# Patient Record
Sex: Male | Born: 1985 | Hispanic: No | Marital: Single | Smoking: Current every day smoker
Health system: Southern US, Community
[De-identification: ages and names within clinical notes are randomized; demographics above are authoritative.]

## PROBLEM LIST (undated history)

## (undated) DIAGNOSIS — I499 Cardiac arrhythmia, unspecified: Secondary | ICD-10-CM

## (undated) DIAGNOSIS — J449 Chronic obstructive pulmonary disease, unspecified: Secondary | ICD-10-CM

## (undated) HISTORY — PX: LEG SURGERY: SHX1003

---

## 2019-10-17 ENCOUNTER — Other Ambulatory Visit: Payer: Self-pay

## 2019-10-17 ENCOUNTER — Encounter (HOSPITAL_COMMUNITY): Payer: Self-pay | Admitting: Emergency Medicine

## 2019-10-17 ENCOUNTER — Emergency Department (HOSPITAL_COMMUNITY)
Admission: EM | Admit: 2019-10-17 | Discharge: 2019-10-17 | Disposition: A | Discharge status: Home / Self Care | Payer: Self-pay | Attending: Emergency Medicine | Admitting: Emergency Medicine

## 2019-10-17 DIAGNOSIS — M7918 Myalgia, other site: Secondary | ICD-10-CM

## 2019-10-17 DIAGNOSIS — M791 Myalgia, unspecified site: Secondary | ICD-10-CM | POA: Insufficient documentation

## 2019-10-17 MED ORDER — MELOXICAM 15 MG PO TABS
15.0000 mg | ORAL_TABLET | Freq: Every day | ORAL | 0 refills | Status: DC
Start: 2019-10-17 — End: 2020-12-06

## 2019-10-17 MED ORDER — METHOCARBAMOL 500 MG PO TABS: 1000.0000 mg | ORAL_TABLET | Freq: Four times a day (QID) | ORAL | 0 refills | Status: DC
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
## 2019-10-17 NOTE — ED Notes (Signed)
Patient verbalizes understanding of discharge instructions. Opportunity for questioning and answers were provided. Armband removed by staff, pt discharged from ED.  
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
## 2019-11-01 ENCOUNTER — Emergency Department (HOSPITAL_COMMUNITY)
Admission: EM | Admit: 2019-11-01 | Discharge: 2019-11-02 | Discharge status: Against Medical Advice | Payer: Medicaid Other

## 2019-11-02 ENCOUNTER — Encounter (HOSPITAL_COMMUNITY): Payer: Self-pay | Admitting: *Deleted

## 2019-11-02 ENCOUNTER — Emergency Department (HOSPITAL_COMMUNITY)
Admission: EM | Admit: 2019-11-02 | Discharge: 2019-11-02 | Disposition: A | Discharge status: Home / Self Care | Payer: Self-pay | Attending: Emergency Medicine | Admitting: Emergency Medicine

## 2019-11-02 ENCOUNTER — Telehealth: Payer: Self-pay

## 2019-11-02 ENCOUNTER — Emergency Department (HOSPITAL_COMMUNITY): Payer: Self-pay

## 2019-11-02 DIAGNOSIS — R29898 Other symptoms and signs involving the musculoskeletal system: Secondary | ICD-10-CM | POA: Insufficient documentation

## 2019-11-02 DIAGNOSIS — G8921 Chronic pain due to trauma: Secondary | ICD-10-CM | POA: Insufficient documentation

## 2019-11-02 DIAGNOSIS — M25519 Pain in unspecified shoulder: Secondary | ICD-10-CM

## 2019-11-02 DIAGNOSIS — M25512 Pain in left shoulder: Secondary | ICD-10-CM | POA: Insufficient documentation

## 2019-11-02 MED ORDER — PREDNISONE 20 MG PO TABS
ORAL_TABLET | ORAL | 0 refills | Status: DC
Start: 2019-11-02 — End: 2020-05-20

## 2019-11-02 MED ORDER — KETOROLAC TROMETHAMINE 15 MG/ML IJ SOLN
15.0000 mg | Freq: Once | INTRAMUSCULAR | Status: AC
  Administered 2019-11-02: 15 mg via INTRAMUSCULAR
  Filled 2019-11-02: qty 1

## 2019-11-02 MED ORDER — PREDNISONE 20 MG PO TABS
60.0000 mg | ORAL_TABLET | Freq: Once | ORAL | Status: AC
  Administered 2019-11-02: 60 mg via ORAL
  Filled 2019-11-02: qty 3
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
## 2019-11-02 NOTE — Telephone Encounter (Signed)
Is that true? You want me to work him in, in 5 days
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
## 2019-11-02 NOTE — ED Notes (Signed)
Pt called for triage, no answer x3 

## 2019-11-02 NOTE — ED Notes (Signed)
Pt called x3 for triage. No answer. 

## 2019-11-02 NOTE — ED Notes (Signed)
Pt in X ray
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
## 2019-11-07 ENCOUNTER — Encounter: Payer: Self-pay | Admitting: Orthopaedic Surgery

## 2019-11-11 ENCOUNTER — Encounter (HOSPITAL_COMMUNITY): Payer: Self-pay

## 2019-11-11 ENCOUNTER — Emergency Department (HOSPITAL_COMMUNITY)
Admission: EM | Admit: 2019-11-11 | Discharge: 2019-11-11 | Disposition: A | Discharge status: Home / Self Care | Payer: Medicaid Other | Attending: Emergency Medicine | Admitting: Emergency Medicine

## 2019-11-11 DIAGNOSIS — M25512 Pain in left shoulder: Secondary | ICD-10-CM | POA: Insufficient documentation

## 2019-11-11 DIAGNOSIS — M542 Cervicalgia: Secondary | ICD-10-CM | POA: Insufficient documentation

## 2019-11-11 DIAGNOSIS — M549 Dorsalgia, unspecified: Secondary | ICD-10-CM | POA: Insufficient documentation

## 2019-11-11 DIAGNOSIS — Z79899 Other long term (current) drug therapy: Secondary | ICD-10-CM | POA: Insufficient documentation

## 2019-11-11 MED ORDER — HYDROCODONE-ACETAMINOPHEN 5-325 MG PO TABS: 1.0000 | ORAL_TABLET | Freq: Four times a day (QID) | ORAL | 0 refills | Status: DC | PRN

## 2019-11-11 MED ORDER — LIDOCAINE 5 % EX PTCH: 1.0000 | MEDICATED_PATCH | CUTANEOUS | 0 refills | Status: DC

## 2019-11-11 MED ORDER — CYCLOBENZAPRINE HCL 10 MG PO TABS
10.0000 mg | ORAL_TABLET | Freq: Two times a day (BID) | ORAL | 0 refills | Status: DC | PRN
Start: 2019-11-11 — End: 2020-12-06
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
## 2019-11-11 NOTE — Discharge Instructions (Signed)
  Antiinflammatory medications: Take 600 mg of ibuprofen every 6 hours or 440 mg (over the counter dose) to 500 mg (prescription dose) of naproxen every 12 hours for the next 3 days. After this time, these medications may be used as needed for pain. Take these medications with food to avoid upset stomach. Choose only one of these medications, do not take them together. Acetaminophen (generic for Tylenol): Should you continue to have additional pain while taking the ibuprofen or naproxen, you may add in acetaminophen as needed. Your daily total maximum amount of acetaminophen from all sources should be limited to 4000mg /day for persons without liver problems, or 2000mg /day for those with liver problems.  Vicodin: May take Vicodin (hydrocodone-acetaminophen) as needed for severe pain.   Do not drive or perform other dangerous activities while taking this medication as it can cause drowsiness as well as changes in reaction time and judgement.   Please note that each pill of Vicodin contains 325 mg of acetaminophen (generic for Tylenol) and the above dosage limits apply. Cyclobenzaprine: Cyclobenzaprine (generic for Flexeril) is a muscle relaxer and can help relieve stiff muscles or muscle spasms.  Do not drive or perform other dangerous activities while taking this medication as it can cause drowsiness as well as changes in reaction time and judgement.  Keep your appointment with the orthopedic specialist, as planned.
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
## 2019-11-11 NOTE — ED Notes (Signed)
Patient verbalizes understanding of discharge instructions. Opportunity for questioning and answers were provided. Armband removed by staff, pt discharged from ED ambulatory.   

## 2019-11-17 ENCOUNTER — Ambulatory Visit: Payer: Self-pay | Admitting: Orthopaedic Surgery

## 2019-12-16 ENCOUNTER — Ambulatory Visit: Payer: Self-pay | Admitting: Orthopaedic Surgery

## 2020-05-20 ENCOUNTER — Encounter (HOSPITAL_COMMUNITY): Payer: Self-pay | Admitting: Emergency Medicine

## 2020-05-20 ENCOUNTER — Emergency Department (HOSPITAL_COMMUNITY)
Admission: EM | Admit: 2020-05-20 | Discharge: 2020-05-20 | Disposition: A | Discharge status: Home / Self Care | Payer: Medicaid Other | Attending: Emergency Medicine | Admitting: Emergency Medicine

## 2020-05-20 ENCOUNTER — Emergency Department (HOSPITAL_COMMUNITY): Payer: Medicaid Other

## 2020-05-20 ENCOUNTER — Other Ambulatory Visit: Payer: Self-pay

## 2020-05-20 DIAGNOSIS — R059 Cough, unspecified: Secondary | ICD-10-CM | POA: Insufficient documentation

## 2020-05-20 DIAGNOSIS — J441 Chronic obstructive pulmonary disease with (acute) exacerbation: Secondary | ICD-10-CM

## 2020-05-20 DIAGNOSIS — R0789 Other chest pain: Secondary | ICD-10-CM | POA: Diagnosis not present

## 2020-05-20 DIAGNOSIS — R079 Chest pain, unspecified: Secondary | ICD-10-CM | POA: Diagnosis not present

## 2020-05-20 DIAGNOSIS — R062 Wheezing: Secondary | ICD-10-CM | POA: Insufficient documentation

## 2020-05-20 LAB — BASIC METABOLIC PANEL
Anion gap: 11 (ref 5–15)
BUN: 8 mg/dL (ref 6–20)
CO2: 22 mmol/L (ref 22–32)
Calcium: 9.5 mg/dL (ref 8.9–10.3)
Chloride: 104 mmol/L (ref 98–111)
Creatinine, Ser: 1.09 mg/dL (ref 0.61–1.24)
GFR, Estimated: >60 mL/min (ref >60)
Glucose, Bld: 168 mg/dL — ABNORMAL HIGH (ref 70–99)
Potassium: 3.7 mmol/L (ref 3.5–5.1)
Sodium: 137 mmol/L (ref 135–145)

## 2020-05-20 LAB — CBC
HCT: 45.7 % (ref 39.0–52.0)
Hemoglobin: 15.4 g/dL (ref 13.0–17.0)
MCH: 28.9 pg (ref 26.0–34.0)
MCHC: 33.7 g/dL (ref 30.0–36.0)
MCV: 85.7 fL (ref 80.0–100.0)
Platelets: 363 10*3/uL (ref 150–400)
RBC: 5.33 MIL/uL (ref 4.22–5.81)
RDW: 15.9 % — ABNORMAL HIGH (ref 11.5–15.5)
WBC: 10.9 10*3/uL — ABNORMAL HIGH (ref 4.0–10.5)
nRBC: 0 % (ref 0.0–0.2)

## 2020-05-20 LAB — TROPONIN I (HIGH SENSITIVITY)
Troponin I (High Sensitivity): 4 ng/L (ref <18)
Troponin I (High Sensitivity): 5 ng/L (ref <18)

## 2020-05-20 LAB — D-DIMER, QUANTITATIVE: D-Dimer, Quant: <0.27 ug/mL-FEU (ref 0.00–0.50)

## 2020-05-20 MED ORDER — KETOROLAC TROMETHAMINE 60 MG/2ML IM SOLN
60.0000 mg | Freq: Once | INTRAMUSCULAR | Status: AC
  Administered 2020-05-20: 60 mg via INTRAMUSCULAR
  Filled 2020-05-20: qty 2

## 2020-05-20 MED ORDER — PREDNISONE 20 MG PO TABS
60.0000 mg | ORAL_TABLET | Freq: Once | ORAL | Status: AC
  Administered 2020-05-20: 60 mg via ORAL
  Filled 2020-05-20: qty 3

## 2020-05-20 MED ORDER — PREDNISONE 20 MG PO TABS
40.0000 mg | ORAL_TABLET | Freq: Every day | ORAL | 0 refills | Status: DC
Start: 2020-05-20 — End: 2020-12-06

## 2020-05-20 MED ORDER — ALBUTEROL SULFATE HFA 108 (90 BASE) MCG/ACT IN AERS
2.0000 | INHALATION_SPRAY | RESPIRATORY_TRACT | Status: DC | PRN
  Administered 2020-05-20: 2 via RESPIRATORY_TRACT
  Filled 2020-05-20: qty 6.7

## 2020-05-20 NOTE — ED Provider Notes (Signed)
[facility] EMERGENCY DEPARTMENT Provider Note   CSN: [id number] Arrival date & time: 05/20/20  0630     History Chief Complaint  Patient presents with  . Cough/Chest Tightness    Robert Eaton is a 34 y.o. male.  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  Patient reports the pain started approximately 6 to 7 hours prior to arrival.  He reports that he got up and he was playing with his dog and then suddenly started having the pain yesterday.  He reports the pain continued to worsen and that is when he called the paramedics.  Patient reports that he always has some wheezing, cough due to his COPD and he does not have an inhaler at home but he has never had pain like this.  He describes it as a sharp pain in the left side of his chest that radiates into his left shoulder and neck.  It is worse with taking deep breaths and moving his left arm.  He works as a Chartered certified accountant and has noticed for a while now that his left arm has been hurting and become weaker but he has never had these symptoms before.  He denies any fever, abdominal pain or vomiting.  He has received his Covid vaccine and has had no sick contacts.  He denies any unilateral leg pain or swelling.  He does report a few years ago after having a femur fracture he did develop a blood clot and was on Coumadin for a while but quit early and has not been on anything for several years.  Currently he reports he still having the pain and he still feels mildly short of breath.  He has not taken anything for this.  The history is provided by the patient.       History reviewed. No pertinent past medical history.  There are no problems to display for this patient.   History reviewed. No pertinent surgical history.     No family history on file.  Social History   Tobacco Use   . Smoking status: Never Smoker  . Smokeless tobacco: Never Used  Substance Use Topics  . Alcohol use: Never  . Drug use: Never    Home Medications Prior to Admission medications   Medication Sig Start Date End Date Taking? Authorizing Provider  cyclobenzaprine (FLEXERIL) 10 MG tablet Take 1 tablet (10 mg total) by mouth 2 (two) times daily as needed for muscle spasms. 11/11/19   Joy, Shawn C, PA-C  HYDROcodone-acetaminophen (NORCO/VICODIN) 5-325 MG tablet Take 1-2 tablets by mouth every 6 (six) hours as needed for severe pain. 11/11/19   Joy, Shawn C, PA-C  lidocaine (LIDODERM) 5 % Place 1 patch onto the skin daily. Remove & Discard patch within 12 hours or as directed by MD 11/11/19   Joy, Hillard Danker, PA-C  meloxicam (MOBIC) 15 MG tablet Take 1 tablet (15 mg total) by mouth daily. 10/17/19   Renne Crigler, PA-C  methocarbamol (ROBAXIN) 500 MG tablet Take 2 tablets (1,000 mg total) by mouth 4 (four) times daily. 10/17/19   Renne Crigler, PA-C  predniSONE (DELTASONE) 20 MG tablet 3 Tabs PO Days 1-3, then 2 tabs PO Days 4-6, then 1 tab PO Day 7-9, then Half Tab PO Day 10-12 11/02/19   Renne Crigler, PA-C    Allergies    Patient has no  known allergies.  Review of Systems   Review of Systems  All other systems reviewed and are negative.   Physical Exam Updated Vital Signs BP 132/88   Pulse 93   Temp 98.8 F (37.1 C) (Oral)   Resp 18   Ht 5\' 9"  (1.753 m)   Wt 92 kg   SpO2 100%   BMI 29.95 kg/m   Physical Exam Constitutional:      General: He is not in acute distress.    Appearance: He is well-developed and well-nourished.  HENT:     Head: Normocephalic and atraumatic.     Right Ear: External ear normal.     Left Ear: External ear normal.     Nose: Nose normal.     Mouth/Throat:     Mouth: Oropharynx is clear and moist. Mucous membranes are moist.  Eyes:     General:        Right eye: No discharge.     Extraocular Movements: EOM normal.     Conjunctiva/sclera: Conjunctivae  normal.     Pupils: Pupils are equal, round, and reactive to light.  Neck:   Cardiovascular:     Rate and Rhythm: Normal rate and regular rhythm.     Pulses: Normal pulses and intact distal pulses.     Heart sounds: Normal heart sounds. No murmur heard.   Pulmonary:     Effort: No tachypnea or respiratory distress.     Breath sounds: Decreased air movement present. No decreased breath sounds, wheezing, rhonchi or rales.  Chest:     Chest wall: Tenderness present.  Abdominal:     Palpations: Abdomen is soft.     Tenderness: There is no abdominal tenderness.  Musculoskeletal:        General: Tenderness present. No edema.     Right shoulder: Normal.     Left shoulder: Tenderness and bony tenderness present. Decreased range of motion. Normal strength. Normal pulse.     Cervical back: Normal range of motion and neck supple. Tenderness present. Muscular tenderness present. No spinous process tenderness.     Right lower leg: No edema.     Left lower leg: No edema.  Skin:    General: Skin is warm and dry.     Findings: No rash.  Neurological:     General: No focal deficit present.     Mental Status: He is alert and oriented to person, place, and time. Mental status is at baseline.  Psychiatric:        Mood and Affect: Mood and affect and mood normal.        Behavior: Behavior normal.        Thought Content: Thought content normal.     ED Results / Procedures / Treatments   Labs (all labs ordered are listed, but only abnormal results are displayed) Labs Reviewed  BASIC METABOLIC PANEL - Abnormal; Notable for the following components:      Result Value   Glucose, Bld 168 (*)    All other components within normal limits  CBC - Abnormal; Notable for the following components:   WBC 10.9 (*)    RDW 15.9 (*)    All other components within normal limits  D-DIMER, QUANTITATIVE (NOT AT [facility])  TROPONIN I (HIGH SENSITIVITY)  TROPONIN I (HIGH SENSITIVITY)    EKG None  ED ECG  REPORT   Date: 05/20/2020  Rate: 116   Rhythm: sinus tachycardia  QRS Axis: right  Intervals: normal  ST/T Wave abnormalities:  normal  Conduction Disutrbances:none  Narrative Interpretation:   Old EKG Reviewed: none available  I have personally reviewed the EKG tracing and agree with the computerized printout as noted.   Radiology DG Chest 2 View  Result Date: 05/20/2020 CLINICAL DATA:  chest tightness EXAM: CHEST - 2 VIEW COMPARISON:  None. FINDINGS: No focal consolidation. Mild peribronchial cuffing. Nodular right base opacity likely reflects the nipple shadow. No pneumothorax or pleural effusion. Cardiomediastinal silhouette is within normal limits. No acute osseous abnormality. IMPRESSION: No focal consolidation. Right basilar nipple shadow. This can be confirmed with nipple markers if suspicion persists. Mild peribronchial cuffing. This can be seen in bronchitis or asthma. Electronically Signed   By: Stana Bunting M.D.   On: 05/20/2020 06:52    Procedures Procedures (including critical care time)  Medications Ordered in ED Medications  ketorolac (TORADOL) injection 60 mg (has no administration in time range)  albuterol (VENTOLIN HFA) 108 (90 Base) MCG/ACT inhaler 2 puff (has no administration in time range)    ED Course  I have reviewed the triage vital signs and the nursing notes.  Pertinent labs & imaging results that were available during my care of the patient were reviewed by me and considered in my medical decision making (see chart for details).    MDM Rules/Calculators/A&P                          34 year old male presenting today with nonspecific chest pain.  It does seem to be pleuritic and worse with movement of his arm.  He reports symptoms started yesterday.  He always has a chronic cough due to ongoing tobacco and marijuana use but denies any new cold-like symptoms such as nasal congestion, fever or productive cough.  Patient has received his Covid  vaccines and has had no known sick contacts.  Patient does not have an inhaler at home as he ran out but also has a history of a blood clot in the past after a femur fracture.  Patient's EKG shows sinus tachycardia 116 with some rightward axis deviation.  Pain is reproducible with palpation of the chest and movement of the arm.  Patient's chest x-ray does show no focal consolidation but does show right basilar nipple shadow and some mild peribronchial cuffing which is most consistent with his history of COPD.  Concern for possible pleuritic pain related to COPD exacerbation versus musculoskeletal pain given his physical job and difficulty moving his shoulder, lower suspicion for ACS at this time given patient's story as well as delta troponins that are negative greater than 6 hours since the pain started which has been consistent.  There is no evidence of pneumothorax, EKG is not consistent with pericarditis and low suspicion for myocarditis.  Also concern for possible PE given patient's prior history and ongoing tobacco use.  We will do a D-dimer as he is otherwise low risk [place].  Patient given an albuterol and Toradol injection to try to improve his pain.  Oxygen saturation remains in the high 90s and 100%.  Blood pressure is within normal limits.  Patient is in no acute distress at this time.  No evidence of shingles on exam.  12:30 PM D-dimer is neg.  On reevaluation after albuterol pt now wheezing slightly and moving air more freely.  Reports mild improvement.  HR remains <100 and blood pressure normal.  Sats 95% or greater.  Will give prednisone and send home with inhaler and short course of  prednisone.  MDM Number of Diagnoses or Management Options   Amount and/or Complexity of Data Reviewed Clinical lab tests: ordered and reviewed Tests in the radiology section of CPT: ordered and reviewed Tests in the medicine section of CPT: ordered and reviewed Review and summarize past medical records:  yes Independent visualization of images, tracings, or specimens: yes  Risk of Complications, Morbidity, and/or Mortality Presenting problems: moderate Diagnostic procedures: low Management options: low  Patient Progress Patient progress: improved    Final Clinical Impression(s) / ED Diagnoses Final diagnoses:  Chest wall pain  COPD exacerbation (HCC)    Rx / DC Orders ED Discharge Orders         Ordered    predniSONE (DELTASONE) 20 MG tablet  Daily        05/20/20 1227           Gwyneth SproutPlunkett, Shaynna Husby, MD 05/20/20 1232

## 2020-05-20 NOTE — ED Triage Notes (Signed)
Patient arrived with EMS from street reports productive cough/chest congestion and chest tightness onset this week , mild SOB , denies fever or chills .

## 2020-05-20 NOTE — Discharge Instructions (Addendum)
Stop smoking.  Use the inhaler every 4-6 hours as needed for shortness of breath, wheezing and take the steroids for the next 5 days.  Return if you can't catch your breath, any passing out or other concerns.  Take ibuprofen and tylenol as needed for the pain.

## 2020-07-20 ENCOUNTER — Other Ambulatory Visit: Payer: Self-pay

## 2020-07-20 ENCOUNTER — Encounter (HOSPITAL_COMMUNITY): Payer: Self-pay | Admitting: Emergency Medicine

## 2020-07-20 ENCOUNTER — Emergency Department (HOSPITAL_COMMUNITY)
Admission: EM | Admit: 2020-07-20 | Discharge: 2020-07-20 | Disposition: A | Discharge status: Home / Self Care | Payer: Medicaid Other | Attending: Emergency Medicine | Admitting: Emergency Medicine

## 2020-07-20 ENCOUNTER — Emergency Department (HOSPITAL_COMMUNITY): Payer: Medicaid Other

## 2020-07-20 DIAGNOSIS — M79602 Pain in left arm: Secondary | ICD-10-CM | POA: Insufficient documentation

## 2020-07-20 DIAGNOSIS — R0789 Other chest pain: Secondary | ICD-10-CM

## 2020-07-20 DIAGNOSIS — J189 Pneumonia, unspecified organism: Secondary | ICD-10-CM

## 2020-07-20 DIAGNOSIS — J181 Lobar pneumonia, unspecified organism: Secondary | ICD-10-CM | POA: Insufficient documentation

## 2020-07-20 LAB — CBC
HCT: 45.7 % (ref 39.0–52.0)
Hemoglobin: 15.1 g/dL (ref 13.0–17.0)
MCH: 28.4 pg (ref 26.0–34.0)
MCHC: 33 g/dL (ref 30.0–36.0)
MCV: 86.1 fL (ref 80.0–100.0)
Platelets: 240 10*3/uL (ref 150–400)
RBC: 5.31 MIL/uL (ref 4.22–5.81)
RDW: 13.3 % (ref 11.5–15.5)
WBC: 7.2 10*3/uL (ref 4.0–10.5)
nRBC: 0 % (ref 0.0–0.2)

## 2020-07-20 LAB — TROPONIN I (HIGH SENSITIVITY)
Troponin I (High Sensitivity): 2 ng/L (ref <18)
Troponin I (High Sensitivity): <2 ng/L (ref <18)

## 2020-07-20 LAB — BASIC METABOLIC PANEL
Anion gap: 11 (ref 5–15)
BUN: 7 mg/dL (ref 6–20)
CO2: 24 mmol/L (ref 22–32)
Calcium: 9.5 mg/dL (ref 8.9–10.3)
Chloride: 101 mmol/L (ref 98–111)
Creatinine, Ser: 0.98 mg/dL (ref 0.61–1.24)
GFR, Estimated: >60 mL/min (ref >60)
Glucose, Bld: 115 mg/dL — ABNORMAL HIGH (ref 70–99)
Potassium: 3.7 mmol/L (ref 3.5–5.1)
Sodium: 136 mmol/L (ref 135–145)

## 2020-07-20 MED ORDER — AMOXICILLIN 500 MG PO CAPS: 1000.0000 mg | ORAL_CAPSULE | Freq: Three times a day (TID) | ORAL | 0 refills | Status: AC

## 2020-07-20 MED ORDER — IBUPROFEN 400 MG PO TABS
600.0000 mg | ORAL_TABLET | Freq: Once | ORAL | Status: AC
  Administered 2020-07-20: 600 mg via ORAL
  Filled 2020-07-20: qty 1

## 2020-07-20 NOTE — ED Triage Notes (Signed)
Patient reports central chest pain radiating to left arm with mild SOB this evening , no emesis or diaphoresis .

## 2020-07-20 NOTE — Discharge Instructions (Addendum)
Your work-up today was reassuring, your chest x-ray did show right-sided pneumonia, I did treat you for this.  I need you to follow-up with your primary care provider in the next couple of days, if you do not have one you go to [facility].  Please use the attached instructions.  If you have any new or worsening concerning symptoms please come back to the emergency department.  I also referred you to cardiology, you can schedule an appointment with them if this occurs again.  Please speak to your pharmacist about any new medications prescribed today in regards to side effects or interactions with other medications.  Your blood pressure is also elevated today, have this rechecked with your PCP.  Get help right away if: You are short of breath and this gets worse. You have more chest pain. Your sickness gets worse. This is very serious if: You are an older adult. Your body's defense system is weak. You cough up blood. Your chest pain becomes exertional, is worse.

## 2020-07-20 NOTE — ED Provider Notes (Addendum)
[facility] EMERGENCY DEPARTMENT Provider Note   CSN: [id number] Arrival date & time: 07/20/20  0256     History Chief Complaint  Patient presents with  . Chest Pain    Robert Eaton is a 35 y.o. male with pertinent past medical history of reported COPD, tobacco abuse that presents emergency department today for centralized chest pain, worse on left side, however is also present on right side.  Patient states that he had chest pain 10 out of 10 after waking up this morning, while shaving.  Patient states that chest pain would sometimes radiate down to left arm dependent on how he moved his left arm.  Worse with certain positions.  Also states that he had left arm numbness, has resolved.  Chest is tender to touch.  Not worse with exertion.  Patient states that it was not worse with inspiration.  Chest pain did not radiate anywhere else.  Feels as if it the chest tightness when he described this.  Also admits to some shortness of breath.  States that while he has been here, chest pain is now 5 out of 10 without any intervention.  Denies any cardiac history.  States that he was diagnosed with COPD, supposed to be taking inhalers due to chronic tobacco abuse.  Denies any fevers, chills, cough.  Denies any URI symptoms.  Was not recently diagnosed with Covid, states that he has had all of his Covid vaccines.  Denies any sick contacts.  States that he is never had anything like this happen to him before.  Denies any difficulty breathing.  Denies any leg swelling, calf pain.  Denies any recent surgeries, long travel, history of coagulation disorder.  Denies any drug use.  No other complaints at this time.  Cuts trees for work.   HPI     History reviewed. No pertinent past medical history.  There are no problems to display for this patient.   History reviewed. No pertinent surgical history.     No family history on file.  Social History   Tobacco Use  . Smoking status:  Never Smoker  . Smokeless tobacco: Never Used  Substance Use Topics  . Alcohol use: Yes  . Drug use: Yes    Types: Marijuana    Home Medications Prior to Admission medications   Medication Sig Start Date End Date Taking? Authorizing Provider  amoxicillin (AMOXIL) 500 MG capsule Take 2 capsules (1,000 mg total) by mouth 3 (three) times daily for 5 days. 07/20/20 07/25/20 Yes Neftali Abair, PA-C  cyclobenzaprine (FLEXERIL) 10 MG tablet Take 1 tablet (10 mg total) by mouth 2 (two) times daily as needed for muscle spasms. Patient not taking: Reported on 05/20/2020 11/11/19   Anselm Pancoast, PA-C  HYDROcodone-acetaminophen (NORCO/VICODIN) 5-325 MG tablet Take 1-2 tablets by mouth every 6 (six) hours as needed for severe pain. Patient not taking: Reported on 05/20/2020 11/11/19   Joy, Ines Bloomer C, PA-C  lidocaine (LIDODERM) 5 % Place 1 patch onto the skin daily. Remove & Discard patch within 12 hours or as directed by MD Patient not taking: Reported on 05/20/2020 11/11/19   Joy, Hillard Danker, PA-C  meloxicam (MOBIC) 15 MG tablet Take 1 tablet (15 mg total) by mouth daily. Patient not taking: Reported on 05/20/2020 10/17/19   Renne Crigler, PA-C  methocarbamol (ROBAXIN) 500 MG tablet Take 2 tablets (1,000 mg total) by mouth 4 (four) times daily. Patient not taking: Reported on 05/20/2020 10/17/19   Renne Crigler, PA-C  predniSONE (  DELTASONE) 20 MG tablet Take 2 tablets (40 mg total) by mouth daily. 05/20/20   Gwyneth Sprout, MD    Allergies    Patient has no known allergies.  Review of Systems   Review of Systems  Constitutional: Negative for chills, diaphoresis, fatigue and fever.  HENT: Negative for congestion, sore throat and trouble swallowing.   Eyes: Negative for pain and visual disturbance.  Respiratory: Positive for shortness of breath. Negative for cough and wheezing.   Cardiovascular: Positive for chest pain. Negative for palpitations and leg swelling.  Gastrointestinal: Negative for abdominal  distention, abdominal pain, diarrhea, nausea and vomiting.  Genitourinary: Negative for difficulty urinating.  Musculoskeletal: Negative for back pain, neck pain and neck stiffness.  Skin: Negative for pallor.  Neurological: Positive for numbness. Negative for dizziness, speech difficulty, weakness and headaches.  Psychiatric/Behavioral: Negative for confusion.    Physical Exam Updated Vital Signs BP 136/78 (BP Location: Right Arm)   Pulse 88   Temp 98 F (36.7 C) (Oral)   Resp 18   Ht 5\' 9"  (1.753 m)   Wt 90 kg   SpO2 98%   BMI 29.30 kg/m   Physical Exam Constitutional:      General: He is not in acute distress.    Appearance: Normal appearance. He is not ill-appearing, toxic-appearing or diaphoretic.  HENT:     Head: Normocephalic and atraumatic.     Mouth/Throat:     Mouth: Mucous membranes are moist.     Pharynx: Oropharynx is clear.  Eyes:     General: No scleral icterus.    Extraocular Movements: Extraocular movements intact.     Pupils: Pupils are equal, round, and reactive to light.  Cardiovascular:     Rate and Rhythm: Normal rate and regular rhythm.     Pulses: Normal pulses.     Heart sounds: Normal heart sounds.  Pulmonary:     Effort: Pulmonary effort is normal. No respiratory distress.     Breath sounds: Normal breath sounds. No stridor. No wheezing, rhonchi or rales.  Chest:     Chest wall: No tenderness.       Comments: Patient chest is tender to touch in these areas, no erythema or warmth.  Worse when he moves in certain ways. Abdominal:     General: Abdomen is flat. There is no distension.     Palpations: Abdomen is soft.     Tenderness: There is no abdominal tenderness. There is no guarding or rebound.  Musculoskeletal:        General: No swelling or tenderness. Normal range of motion.     Cervical back: Normal range of motion and neck supple. No rigidity.     Right lower leg: No edema.     Left lower leg: No edema.  Skin:    General: Skin is  warm and dry.     Capillary Refill: Capillary refill takes less than 2 seconds.     Coloration: Skin is not pale.  Neurological:     General: No focal deficit present.     Mental Status: He is alert and oriented to person, place, and time.  Psychiatric:        Mood and Affect: Mood normal.        Behavior: Behavior normal.     ED Results / Procedures / Treatments   Labs (all labs ordered are listed, but only abnormal results are displayed) Labs Reviewed  BASIC METABOLIC PANEL - Abnormal; Notable for the following components:  Result Value   Glucose, Bld 115 (*)    All other components within normal limits  CBC  TROPONIN I (HIGH SENSITIVITY)  TROPONIN I (HIGH SENSITIVITY)    EKG EKG Interpretation  Date/Time:  Thursday July 20 2020 03:05:53 EST Ventricular Rate:  98 PR Interval:  134 QRS Duration: 90 QT Interval:  346 QTC Calculation: 441 R Axis:   95 Text Interpretation: Normal sinus rhythm Possible Left atrial enlargement Rightward axis Borderline ECG No old tracing to compare Confirmed by Dione Booze ([id number]) on 07/20/2020 5:14:22 AM   Radiology DG Chest 2 View  Result Date: 07/20/2020 CLINICAL DATA:  Chest pain EXAM: CHEST - 2 VIEW COMPARISON:  05/20/2020 FINDINGS: Vague patchy density in the medial right lung base. Left lung clear. Heart is normal size. No effusions or acute bony abnormality. IMPRESSION: Vague density at the medial right lung base concerning for pneumonia. Electronically Signed   By: Charlett Nose M.D.   On: 07/20/2020 03:31    Procedures Procedures   Medications Ordered in ED Medications  ibuprofen (ADVIL) tablet 600 mg (600 mg Oral Given 07/20/20 0702)    ED Course  I have reviewed the triage vital signs and the nursing notes.  Pertinent labs & imaging results that were available during my care of the patient were reviewed by me and considered in my medical decision making (see chart for details).    MDM Rules/Calculators/A&P                          Abdurrahman Petersheim is a 35 y.o. male with pertinent past medical history of reported COPD, tobacco abuse that presents emergency department today for centralized chest pain, worse on left side, however is present on right side.  Is tender to touch.  Benign lung exam, patient appears very well.  Differential diagnoses considered include musculoskeletal, ACS unlikely due to no risk factors with patient's age, pneumonia, COPD exacerbation.  HEAR score 2.  PERC negative.  Initial interventions ibuprofen.  ECG interpreted by me demonstrated no ST elevations or depressions.  CXR interpreted by me demonstrated right sided pneumonia.  We will treat for this at this time since patient did have some shortness of breath with some right sided chest pain, however do think that there is a muscular skeletal component as well.   Labs demonstrated unremarkable CBC and BMP.  Troponin 2, repeat less than 2.  Patient to be discharged, unlikely ACS.  Upon reassessment patient states that he feels better, pain has mostly resolved, normal ambulation.  Doubt need for further emergent work up at this time. I explained the diagnosis and have given explicit precautions to return to the ER including for any other new or worsening symptoms. The patient understands and accepts the medical plan as it's been dictated and I have answered their questions. Discharge instructions concerning home care and prescriptions have been given. The patient is STABLE and is discharged to home in good condition. Pt has been advised to return to the ED if CP becomes exertional, associated with diaphoresis or nausea, radiates to left jaw/arm, worsens or becomes concerning in any way. Pt appears reliable for follow up and is agreeable to discharge.   The patient was counseled on the dangers of tobacco use, and was advised to quit.  Reviewed strategies to maximize success, including removing cigarettes and smoking materials from  environment, stress management, substitution of other forms of reinforcement, support of family/friends and written materials. Total time  was 5 min CPT code 76720.     Final Clinical Impression(s) / ED Diagnoses Final diagnoses:  Chest wall pain  Community acquired pneumonia of right middle lobe of lung    Rx / DC Orders ED Discharge Orders         Ordered    amoxicillin (AMOXIL) 500 MG capsule  3 times daily        07/20/20 0729             Farrel Gordon, PA-C 07/20/20 9470    Benjiman Core, MD 07/20/20 1520

## 2020-10-25 ENCOUNTER — Emergency Department (HOSPITAL_COMMUNITY): Payer: Medicaid Other

## 2020-10-25 ENCOUNTER — Encounter (HOSPITAL_COMMUNITY): Payer: Self-pay

## 2020-10-25 ENCOUNTER — Emergency Department (HOSPITAL_COMMUNITY)
Admission: EM | Admit: 2020-10-25 | Discharge: 2020-10-25 | Disposition: A | Discharge status: Home / Self Care | Payer: Medicaid Other | Attending: Emergency Medicine | Admitting: Emergency Medicine

## 2020-10-25 ENCOUNTER — Other Ambulatory Visit: Payer: Self-pay

## 2020-10-25 DIAGNOSIS — J449 Chronic obstructive pulmonary disease, unspecified: Secondary | ICD-10-CM | POA: Insufficient documentation

## 2020-10-25 DIAGNOSIS — R079 Chest pain, unspecified: Secondary | ICD-10-CM | POA: Diagnosis not present

## 2020-10-25 DIAGNOSIS — Z5321 Procedure and treatment not carried out due to patient leaving prior to being seen by health care provider: Secondary | ICD-10-CM | POA: Insufficient documentation

## 2020-10-25 DIAGNOSIS — R0789 Other chest pain: Secondary | ICD-10-CM | POA: Diagnosis not present

## 2020-10-25 LAB — CBC
HCT: 42.9 % (ref 39.0–52.0)
Hemoglobin: 13.5 g/dL (ref 13.0–17.0)
MCH: 28.1 pg (ref 26.0–34.0)
MCHC: 31.5 g/dL (ref 30.0–36.0)
MCV: 89.2 fL (ref 80.0–100.0)
Platelets: 262 10*3/uL (ref 150–400)
RBC: 4.81 MIL/uL (ref 4.22–5.81)
RDW: 14.3 % (ref 11.5–15.5)
WBC: 8.1 10*3/uL (ref 4.0–10.5)
nRBC: 0 % (ref 0.0–0.2)

## 2020-10-25 LAB — BASIC METABOLIC PANEL
Anion gap: 7 (ref 5–15)
BUN: 9 mg/dL (ref 6–20)
CO2: 22 mmol/L (ref 22–32)
Calcium: 9 mg/dL (ref 8.9–10.3)
Chloride: 107 mmol/L (ref 98–111)
Creatinine, Ser: 0.81 mg/dL (ref 0.61–1.24)
GFR, Estimated: >60 mL/min (ref >60)
Glucose, Bld: 96 mg/dL (ref 70–99)
Potassium: 3.5 mmol/L (ref 3.5–5.1)
Sodium: 136 mmol/L (ref 135–145)

## 2020-10-25 LAB — TROPONIN I (HIGH SENSITIVITY): Troponin I (High Sensitivity): <2 ng/L (ref <18)

## 2020-10-25 NOTE — ED Triage Notes (Signed)
Brought in by [facility] c/o chest pain x 2hours that has resolved. Hx of COPD.    No IV access. bp 138/86. Hr 95 98% on RA rr 18 bgl 137

## 2020-10-25 NOTE — ED Notes (Signed)
Called pts name to go to scans 3x with no response.

## 2020-11-23 ENCOUNTER — Emergency Department (HOSPITAL_COMMUNITY): Payer: Medicaid Other

## 2020-11-23 ENCOUNTER — Emergency Department (HOSPITAL_COMMUNITY)
Admission: EM | Admit: 2020-11-23 | Discharge: 2020-11-24 | Discharge status: Against Medical Advice | Payer: Medicaid Other | Attending: Emergency Medicine | Admitting: Emergency Medicine

## 2020-11-23 DIAGNOSIS — Z5321 Procedure and treatment not carried out due to patient leaving prior to being seen by health care provider: Secondary | ICD-10-CM | POA: Insufficient documentation

## 2020-11-23 DIAGNOSIS — M79651 Pain in right thigh: Secondary | ICD-10-CM | POA: Insufficient documentation

## 2020-11-23 DIAGNOSIS — M79606 Pain in leg, unspecified: Secondary | ICD-10-CM | POA: Diagnosis not present

## 2020-11-23 DIAGNOSIS — J449 Chronic obstructive pulmonary disease, unspecified: Secondary | ICD-10-CM | POA: Insufficient documentation
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
## 2020-11-24 NOTE — ED Notes (Signed)
Pt called for vitals, no response. 

## 2020-12-05 ENCOUNTER — Encounter (HOSPITAL_COMMUNITY): Payer: Self-pay | Admitting: Emergency Medicine

## 2020-12-05 ENCOUNTER — Emergency Department (HOSPITAL_COMMUNITY)
Admission: EM | Admit: 2020-12-05 | Discharge: 2020-12-06 | Disposition: A | Discharge status: Home / Self Care | Payer: Medicaid Other | Attending: Emergency Medicine | Admitting: Emergency Medicine

## 2020-12-05 ENCOUNTER — Other Ambulatory Visit: Payer: Self-pay

## 2020-12-05 DIAGNOSIS — Y9389 Activity, other specified: Secondary | ICD-10-CM | POA: Insufficient documentation

## 2020-12-05 DIAGNOSIS — S81012A Laceration without foreign body, left knee, initial encounter: Secondary | ICD-10-CM | POA: Insufficient documentation

## 2020-12-05 DIAGNOSIS — W312XXA Contact with powered woodworking and forming machines, initial encounter: Secondary | ICD-10-CM | POA: Insufficient documentation

## 2020-12-05 DIAGNOSIS — R58 Hemorrhage, not elsewhere classified: Secondary | ICD-10-CM | POA: Diagnosis not present

## 2020-12-05 MED ORDER — LIDOCAINE-EPINEPHRINE (PF) 2 %-1:200000 IJ SOLN
10.0000 mL | Freq: Once | INTRAMUSCULAR | Status: AC
  Administered 2020-12-06: 10 mL
  Filled 2020-12-05: qty 20

## 2020-12-05 NOTE — ED Provider Notes (Signed)
[facility] EMERGENCY DEPARTMENT Provider Note   CSN: [id number] Arrival date & time: 12/05/20  2238     History Chief Complaint  Patient presents with   Laceration    Robert Eaton is a 35 y.o. male.  The history is provided by the patient.  Laceration He was using a chainsaw when it kicked back and cut him on his left knee.  He is up-to-date on tetanus immunizations.  He denies other injury.   History reviewed. No pertinent past medical history.  There are no problems to display for this patient.   History reviewed. No pertinent surgical history.     History reviewed. No pertinent family history.  Social History   Tobacco Use   Smoking status: Never   Smokeless tobacco: Never  Substance Use Topics   Alcohol use: Yes   Drug use: Yes    Types: Marijuana    Home Medications Prior to Admission medications   Medication Sig Start Date End Date Taking? Authorizing Provider  cyclobenzaprine (FLEXERIL) 10 MG tablet Take 1 tablet (10 mg total) by mouth 2 (two) times daily as needed for muscle spasms. Patient not taking: Reported on 05/20/2020 11/11/19   Anselm Pancoast, PA-C  HYDROcodone-acetaminophen (NORCO/VICODIN) 5-325 MG tablet Take 1-2 tablets by mouth every 6 (six) hours as needed for severe pain. Patient not taking: Reported on 05/20/2020 11/11/19   Joy, Ines Bloomer C, PA-C  lidocaine (LIDODERM) 5 % Place 1 patch onto the skin daily. Remove & Discard patch within 12 hours or as directed by MD Patient not taking: Reported on 05/20/2020 11/11/19   Joy, Hillard Danker, PA-C  meloxicam (MOBIC) 15 MG tablet Take 1 tablet (15 mg total) by mouth daily. Patient not taking: Reported on 05/20/2020 10/17/19   Renne Crigler, PA-C  methocarbamol (ROBAXIN) 500 MG tablet Take 2 tablets (1,000 mg total) by mouth 4 (four) times daily. Patient not taking: Reported on 05/20/2020 10/17/19   Renne Crigler, PA-C  predniSONE (DELTASONE) 20 MG tablet Take 2 tablets (40 mg total) by mouth  daily. 05/20/20   Gwyneth Sprout, MD    Allergies    Patient has no known allergies.  Review of Systems   Review of Systems  All other systems reviewed and are negative.  Physical Exam Updated Vital Signs BP 122/88   Pulse 87   Temp 98.1 F (36.7 C) (Oral)   Resp (!) 22   SpO2 96%   Physical Exam Vitals and nursing note reviewed.  35 year old male, resting comfortably and in no acute distress. Vital signs are normal. Oxygen saturation is 96%, which is normal. Head is normocephalic and atraumatic. PERRLA, EOMI. Oropharynx is clear. Neck is nontender and supple without adenopathy or JVD. Back is nontender and there is no CVA tenderness. Lungs are clear without rales, wheezes, or rhonchi. Chest is nontender. Heart has regular rate and rhythm without murmur. Abdomen is soft, flat, nontender without masses or hepatosplenomegaly and peristalsis is normoactive. Extremities: Laceration is present on the anteromedial aspect of the left knee.  No crepitus present.  Full passive range of motion present.  Remainder of extremity exam is normal. Skin is warm and dry without rash. Neurologic: Mental status is normal, cranial nerves are intact, there are no motor or sensory deficits.  ED Results / Procedures / Treatments    Procedures .Marland KitchenLaceration Repair  Date/Time: 12/06/2020 2:40 AM Performed by: Dione Booze, MD Authorized by: Dione Booze, MD   Consent:    Consent obtained:  Verbal  Consent given by:  Patient   Risks discussed:  Infection, pain and poor wound healing   Alternatives discussed:  No treatment Universal protocol:    Procedure explained and questions answered to patient or proxy's satisfaction: yes     Relevant documents present and verified: yes     Required blood products, implants, devices, and special equipment available: yes     Site/side marked: yes     Immediately prior to procedure, a time out was called: yes     Patient identity confirmed:  Verbally  with patient and arm band Anesthesia:    Anesthesia method:  Local infiltration   Local anesthetic:  Lidocaine 2% WITH epi Laceration details:    Location:  Leg   Leg location:  L knee   Length (cm):  4   Depth (mm):  3 Pre-procedure details:    Preparation:  Patient was prepped and draped in usual sterile fashion Exploration:    Limited defect created (wound extended): no     Hemostasis achieved with:  Direct pressure   Wound exploration: entire depth of wound visualized     Wound extent: no foreign bodies/material noted, no muscle damage noted, no tendon damage noted, no underlying fracture noted and no vascular damage noted     Contaminated: no   Treatment:    Area cleansed with:  Saline   Amount of cleaning:  Standard   Debridement:  None   Undermining:  None   Scar revision: no   Skin repair:    Repair method:  Sutures   Suture size:  4-0   Suture material:  Nylon   Number of sutures:  5 Approximation:    Approximation:  Close Repair type:    Repair type:  Simple Post-procedure details:    Dressing:  Antibiotic ointment and sterile dressing   Procedure completion:  Tolerated well, no immediate complications   Medications Ordered in ED Medications  lidocaine-EPINEPHrine (XYLOCAINE W/EPI) 2 %-1:200000 (PF) injection 10 mL (has no administration in time range)    ED Course  I have reviewed the triage vital signs and the nursing notes.  MDM Rules/Calculators/A&P                         Laceration of the left knee treated with suturing.  Old records were reviewed, and he has no relevant past visits.  Final Clinical Impression(s) / ED Diagnoses Final diagnoses:  Laceration of skin of left knee, initial encounter    Rx / DC Orders ED Discharge Orders     None        Dione Booze, MD 12/06/20 [phone]

## 2020-12-05 NOTE — ED Triage Notes (Signed)
Patient was cutting wood with chainsaw, it kicked back and hit him just below his knee cap on the left.  Patient states that it happen about 2 hrs ago.  Patient has had bleeding thru all bandaging.  Patient called EMS.  No tourniquet needed.  Bleeding controlled at this time.

## 2020-12-08 ENCOUNTER — Emergency Department (HOSPITAL_COMMUNITY)
Admission: EM | Admit: 2020-12-08 | Discharge: 2020-12-08 | Disposition: A | Discharge status: Home / Self Care | Payer: Medicaid Other | Attending: Emergency Medicine | Admitting: Emergency Medicine

## 2020-12-08 ENCOUNTER — Other Ambulatory Visit: Payer: Self-pay

## 2020-12-08 DIAGNOSIS — W312XXD Contact with powered woodworking and forming machines, subsequent encounter: Secondary | ICD-10-CM | POA: Insufficient documentation

## 2020-12-08 DIAGNOSIS — S81012D Laceration without foreign body, left knee, subsequent encounter: Secondary | ICD-10-CM | POA: Insufficient documentation

## 2020-12-08 DIAGNOSIS — Z5189 Encounter for other specified aftercare: Secondary | ICD-10-CM

## 2020-12-08 DIAGNOSIS — Z4800 Encounter for change or removal of nonsurgical wound dressing: Secondary | ICD-10-CM | POA: Insufficient documentation

## 2020-12-08 MED ORDER — CEPHALEXIN 250 MG PO CAPS
500.0000 mg | ORAL_CAPSULE | Freq: Once | ORAL | Status: AC
  Administered 2020-12-08: 500 mg via ORAL
  Filled 2020-12-08: qty 2

## 2020-12-08 MED ORDER — CEPHALEXIN 500 MG PO CAPS: 500.0000 mg | ORAL_CAPSULE | Freq: Four times a day (QID) | ORAL | 0 refills | Status: DC

## 2020-12-08 MED ORDER — NAPROXEN 250 MG PO TABS
500.0000 mg | ORAL_TABLET | Freq: Once | ORAL | Status: AC
  Administered 2020-12-08: 500 mg via ORAL
  Filled 2020-12-08: qty 2

## 2020-12-08 NOTE — ED Triage Notes (Signed)
Pt c/o continued pain to L medial knee from chainsaw lac 2 days ago, requesting pain mgmt. Pt seen recently for same, states he's not utilized given resources. 7/10 "sore" pain

## 2020-12-08 NOTE — Discharge Instructions (Addendum)
Take Keflex as prescribed. Continue 600mg  ibuprofen every 6 hours for pain. Avoid soaking your wound in stagnant or dirty water such as while taking a bath. You can shower normally. Keep the area clean with mild soap and warm water. Do not apply peroxide or alcohol to your wound as this can break down newly forming skin and prolong wound healing. If you keep the area bandaged, change the dressing/bandage at least once per day. Have your staples/sutures removed in 7-10 days.

## 2020-12-08 NOTE — ED Provider Notes (Signed)
[facility] EMERGENCY DEPARTMENT Provider Note   CSN: [id number] Arrival date & time: 12/08/20  0112     History Chief Complaint  Patient presents with   L knee pain    Corinthian Mizrahi is a 35 y.o. male.  35 year old male presents to the emergency department for complaints of pain to his left medial knee.  He sustained a laceration to his knee 2 days ago after there was a kick back from his chainsaw.  He has been using ibuprofen for symptoms without relief.  Is requesting additional medications for pain.  Pain is described as a "soreness", currently rated at 7/10.  No fevers or drainage from his wound site.  The history is provided by the patient. No language interpreter was used.      No past medical history on file.  There are no problems to display for this patient.   No past surgical history on file.     No family history on file.  Social History   Tobacco Use   Smoking status: Never   Smokeless tobacco: Never  Substance Use Topics   Alcohol use: Yes   Drug use: Yes    Types: Marijuana    Home Medications Prior to Admission medications   Medication Sig Start Date End Date Taking? Authorizing Provider  cephALEXin (KEFLEX) 500 MG capsule Take 1 capsule (500 mg total) by mouth 4 (four) times daily. 12/08/20  Yes Antony Madura, PA-C    Allergies    Patient has no known allergies.  Review of Systems   Review of Systems Ten systems reviewed and are negative for acute change, except as noted in the HPI.    Physical Exam Updated Vital Signs BP (!) 128/99 (BP Location: Right Arm)   Pulse 98   Temp 98.2 F (36.8 C) (Oral)   Resp 18   SpO2 97%   Physical Exam Vitals and nursing note reviewed.  Constitutional:      General: He is not in acute distress.    Appearance: He is well-developed. He is not diaphoretic.     Comments: Nontoxic appearing and in NAD  HENT:     Head: Normocephalic and atraumatic.  Eyes:     General: No scleral  icterus.    Conjunctiva/sclera: Conjunctivae normal.  Cardiovascular:     Rate and Rhythm: Normal rate and regular rhythm.     Pulses: Normal pulses.     Comments: DP pulse 2+ in the LLE Pulmonary:     Effort: Pulmonary effort is normal. No respiratory distress.     Breath sounds: No stridor. No wheezing.     Comments: Respirations even and unlabored Musculoskeletal:        General: Normal range of motion.     Cervical back: Normal range of motion.  Skin:    General: Skin is warm and dry.     Coloration: Skin is not pale.     Findings: No erythema or rash.     Comments: Sutured laceration of the medial L knee. Mild erythema surrounding laceration site. Favored to reflect inflammation rather than infection. No purulence, fluctuance, lymphangitic streaking.  Neurological:     Mental Status: He is alert and oriented to person, place, and time.     Coordination: Coordination normal.  Psychiatric:        Behavior: Behavior normal.    ED Results / Procedures / Treatments   Labs (all labs ordered are listed, but only abnormal results are displayed) Labs Reviewed -  No data to display  EKG None  Radiology No results found.  Procedures Procedures   Medications Ordered in ED Medications  cephALEXin (KEFLEX) capsule 500 mg (has no administration in time range)  naproxen (NAPROSYN) tablet 500 mg (has no administration in time range)    ED Course  I have reviewed the triage vital signs and the nursing notes.  Pertinent labs & imaging results that were available during my care of the patient were reviewed by me and considered in my medical decision making (see chart for details).    MDM Rules/Calculators/A&P                          35 year old male presents for wound recheck of his laceration site to his medial left knee.  No current evidence of cellulitis or abscess.  There is minimal redness around the site of the laceration.  This is favored to be inflammatory rather than  infectious; however, will start on course of Keflex as a precaution.  Counseled on the use of NSAIDs as well as the need to return for new or concerning symptoms.  Patient discharged in stable condition with no unaddressed concerns.   Final Clinical Impression(s) / ED Diagnoses Final diagnoses:  Encounter for wound re-check    Rx / DC Orders ED Discharge Orders          Ordered    cephALEXin (KEFLEX) 500 MG capsule  4 times daily        12/08/20 0501             Antony Madura, PA-C 12/08/20 0518    Zadie Rhine, MD 12/08/20 [phone]

## 2020-12-09 ENCOUNTER — Emergency Department (HOSPITAL_COMMUNITY)
Admission: EM | Admit: 2020-12-09 | Discharge: 2020-12-10 | Disposition: A | Discharge status: Home / Self Care | Payer: Medicaid Other | Attending: Emergency Medicine | Admitting: Emergency Medicine

## 2020-12-09 ENCOUNTER — Emergency Department (HOSPITAL_COMMUNITY): Payer: Medicaid Other

## 2020-12-09 ENCOUNTER — Encounter (HOSPITAL_COMMUNITY): Payer: Self-pay | Admitting: Emergency Medicine

## 2020-12-09 ENCOUNTER — Other Ambulatory Visit: Payer: Self-pay

## 2020-12-09 DIAGNOSIS — R002 Palpitations: Secondary | ICD-10-CM | POA: Diagnosis not present

## 2020-12-09 DIAGNOSIS — I10 Essential (primary) hypertension: Secondary | ICD-10-CM | POA: Diagnosis not present

## 2020-12-09 DIAGNOSIS — R0602 Shortness of breath: Secondary | ICD-10-CM | POA: Insufficient documentation

## 2020-12-09 DIAGNOSIS — R202 Paresthesia of skin: Secondary | ICD-10-CM | POA: Insufficient documentation

## 2020-12-09 DIAGNOSIS — R0789 Other chest pain: Secondary | ICD-10-CM | POA: Insufficient documentation

## 2020-12-09 DIAGNOSIS — Z20822 Contact with and (suspected) exposure to covid-19: Secondary | ICD-10-CM | POA: Insufficient documentation

## 2020-12-09 DIAGNOSIS — R000 Tachycardia, unspecified: Secondary | ICD-10-CM | POA: Insufficient documentation

## 2020-12-09 LAB — BASIC METABOLIC PANEL
Anion gap: 9 (ref 5–15)
BUN: 5 mg/dL — ABNORMAL LOW (ref 6–20)
CO2: 23 mmol/L (ref 22–32)
Calcium: 9 mg/dL (ref 8.9–10.3)
Chloride: 103 mmol/L (ref 98–111)
Creatinine, Ser: 0.73 mg/dL (ref 0.61–1.24)
GFR, Estimated: >60 mL/min (ref >60)
Glucose, Bld: 110 mg/dL — ABNORMAL HIGH (ref 70–99)
Potassium: 3.2 mmol/L — ABNORMAL LOW (ref 3.5–5.1)
Sodium: 135 mmol/L (ref 135–145)

## 2020-12-09 LAB — CBC WITH DIFFERENTIAL/PLATELET
Abs Immature Granulocytes: 0.02 10*3/uL (ref 0.00–0.07)
Basophils Absolute: 0 10*3/uL (ref 0.0–0.1)
Basophils Relative: 0 %
Eosinophils Absolute: 0.1 10*3/uL (ref 0.0–0.5)
Eosinophils Relative: 1 %
HCT: 45.3 % (ref 39.0–52.0)
Hemoglobin: 15.1 g/dL (ref 13.0–17.0)
Immature Granulocytes: 0 %
Lymphocytes Relative: 30 %
Lymphs Abs: 2.2 10*3/uL (ref 0.7–4.0)
MCH: 28.5 pg (ref 26.0–34.0)
MCHC: 33.3 g/dL (ref 30.0–36.0)
MCV: 85.6 fL (ref 80.0–100.0)
Monocytes Absolute: 0.7 10*3/uL (ref 0.1–1.0)
Monocytes Relative: 9 %
Neutro Abs: 4.3 10*3/uL (ref 1.7–7.7)
Neutrophils Relative %: 60 %
Platelets: 266 10*3/uL (ref 150–400)
RBC: 5.29 MIL/uL (ref 4.22–5.81)
RDW: 14.8 % (ref 11.5–15.5)
WBC: 7.3 10*3/uL (ref 4.0–10.5)
nRBC: 0 % (ref 0.0–0.2)

## 2020-12-09 LAB — TROPONIN I (HIGH SENSITIVITY)
Troponin I (High Sensitivity): 4 ng/L (ref <18)
Troponin I (High Sensitivity): 4 ng/L (ref <18)

## 2020-12-09 MED ORDER — POTASSIUM CHLORIDE CRYS ER 20 MEQ PO TBCR
40.0000 meq | EXTENDED_RELEASE_TABLET | Freq: Once | ORAL | Status: AC
  Administered 2020-12-09: 40 meq via ORAL
  Filled 2020-12-09: qty 2

## 2020-12-09 NOTE — ED Provider Notes (Addendum)
Emergency Medicine Provider Triage Evaluation Note  Robert Eaton , a 35 y.o. male  was evaluated in triage.  Pt complains of chest pain.  Pain started 30 minutes prior.  Pain is located to the left side of his chest.  Pain does not radiate.  Patient describes it as a stabbing sensation.  Patient endorses anxiety, palpitations and shortness of breath.  No associated nausea, vomiting, or diaphoresis.   Review of Systems  Positive: Chest pain, shortness of breath, palpitations, anxiety Negative: nausea, vomiting, or diaphoresis, leg swelling  Physical Exam  BP (!) 161/119 (BP Location: Left Arm)   Pulse (!) 104   Temp 97.7 F (36.5 C) (Oral)   Resp 16   SpO2 100%  Gen:   Awake, no distress   Resp:  Normal effort, lungs clear to auscultation bilaterally MSK:   Moves extremities without difficulty  Other:  S1, S2 present with no murmurs rubs or gallops.  No swelling, tenderness, or edema to bilateral lower extremities  Medical Decision Making  Medically screening exam initiated at 2:52 PM.  Appropriate orders placed.  Robert Eaton was informed that the remainder of the evaluation will be completed by another provider, this initial triage assessment does not replace that evaluation, and the importance of remaining in the ED until their evaluation is complete.  The patient appears stable so that the remainder of the work up may be completed by another provider.      Haskel Schroeder, PA-C 12/09/20 1454    Haskel Schroeder, PA-C 12/09/20 1551    Koleen Distance, MD 12/09/20 1714

## 2020-12-09 NOTE — ED Provider Notes (Signed)
[facility]   CSN: [id number] Arrival date & time: 12/09/20  1435     History Chief Complaint  Patient presents with   Chest Pain   Anxiety    Robert Eaton is a 35 y.o. male presents to the Emergency Department complaining of gradual, waxing and waning, progressively worsening central and left sided chest pain onset 1pm this afternoon. Associated symptoms include shortness of breath, "sweating" and some tingling in the left arm.  Pt reports pain and SOB are significantly worse with exertion.  Pt reports he drinks 6-12 beers daily, smokes marijuana regularly and does engage in cocaine usage.  Marijuana this morning, no cocaine in the last 24 hours.  No aggravating or alleviating factors. Pt reports hx of DVT after femur fracture in 2015.  He is not currently on any anticoagulants.  Recent soft tissue injury of the left thigh from a chainsaw but denies fractures or surgeries.  Pt denies fever, chills, headache, neck pain, abd pain, N/V/D, weakness, dizziness, syncope, dysuria.     The history is provided by the patient and medical records. No language interpreter was used.   HPI: A 35 year old patient presents for evaluation of chest pain. Initial onset of pain was more than 6 hours ago. The patient's chest pain is described as heaviness/pressure/tightness and is worse with exertion. The patient reports some diaphoresis. The patient's chest pain is middle- or left-sided, is not well-localized, is not sharp and does radiate to the arms/jaw/neck. The patient does not complain of nausea. The patient has smoked in the past 90 days and has a family history of coronary artery disease in a first-degree relative with onset less than age 36. The patient has no history of stroke, has no history of peripheral artery disease, denies any history of treated diabetes, is not hypertensive, has no history of hypercholesterolemia and does not have an elevated BMI  (>=30).   History reviewed. No pertinent past medical history.  There are no problems to display for this patient.   History reviewed. No pertinent surgical history.     No family history on file.  Social History   Tobacco Use   Smoking status: Never   Smokeless tobacco: Never  Substance Use Topics   Alcohol use: Yes   Drug use: Yes    Types: Marijuana    Home Medications Prior to Admission medications   Medication Sig Start Date End Date Taking? Authorizing Provider  ibuprofen (ADVIL) 200 MG tablet Take 1,200-1,400 mg by mouth every 8 (eight) hours as needed for mild pain.   Yes [provider]  pantoprazole (PROTONIX) 40 MG tablet Take 1 tablet (40 mg total) by mouth daily. 12/10/20  Yes Onnika Siebel, Dahlia ClientHannah, PA-C  cephALEXin (KEFLEX) 500 MG capsule Take 1 capsule (500 mg total) by mouth 4 (four) times daily. 12/08/20   Antony MaduraHumes, Kelly, PA-C    Allergies    Patient has no known allergies.  Review of Systems   Review of Systems  Constitutional:  Positive for diaphoresis. Negative for appetite change, fatigue, fever and unexpected weight change.  HENT:  Negative for mouth sores.   Eyes:  Negative for visual disturbance.  Respiratory:  Positive for chest tightness and shortness of breath. Negative for cough and wheezing.   Cardiovascular:  Positive for chest pain and palpitations.  Gastrointestinal:  Negative for abdominal pain, constipation, diarrhea, nausea and vomiting.  Endocrine: Negative for polydipsia, polyphagia and polyuria.  Genitourinary:  Negative for dysuria, frequency, hematuria and  urgency.  Musculoskeletal:  Negative for back pain and neck stiffness.  Skin:  Negative for rash.  Allergic/Immunologic: Negative for immunocompromised state.  Neurological:  Negative for syncope, light-headedness and headaches.  Hematological:  Does not bruise/bleed easily.  Psychiatric/Behavioral:  Negative for sleep disturbance. The patient is not nervous/anxious.     Physical Exam Updated Vital Signs BP (!) 169/112   Pulse (!) 104   Temp 97.7 F (36.5 C) (Oral)   Resp 17   SpO2 99%   Physical Exam Vitals and nursing note reviewed.  Constitutional:      General: He is not in acute distress.    Appearance: He is not diaphoretic.  HENT:     Head: Normocephalic.  Eyes:     General: No scleral icterus.    Conjunctiva/sclera: Conjunctivae normal.  Cardiovascular:     Rate and Rhythm: Regular rhythm. Tachycardia present.     Pulses: Normal pulses.          Radial pulses are 2+ on the right side and 2+ on the left side.     Comments: Hr in the upper 90s-low 100s sitting still.  Increases to 115 with movement in bed. Pulmonary:     Effort: Pulmonary effort is normal. No tachypnea, accessory muscle usage, prolonged expiration, respiratory distress or retractions.     Breath sounds: No stridor.     Comments: Equal chest rise. No increased work of breathing. Congested cough - pt reports baseline Abdominal:     General: There is no distension.     Palpations: Abdomen is soft.     Tenderness: There is no abdominal tenderness. There is no guarding or rebound.  Musculoskeletal:        General: Normal range of motion.     Cervical back: Normal range of motion.     Right lower leg: No tenderness. No edema.     Left lower leg: No tenderness. No edema.     Comments: Moves all extremities equally and without difficulty.  Skin:    General: Skin is warm and dry.     Capillary Refill: Capillary refill takes less than 2 seconds.  Neurological:     Mental Status: He is alert.     GCS: GCS eye subscore is 4. GCS verbal subscore is 5. GCS motor subscore is 6.     Comments: Speech is clear and goal oriented.  Psychiatric:        Mood and Affect: Mood normal.    ED Results / Procedures / Treatments   Labs (all labs ordered are listed, but only abnormal results are displayed) Labs Reviewed  BASIC METABOLIC PANEL - Abnormal; Notable for the following  components:      Result Value   Potassium 3.2 (*)    Glucose, Bld 110 (*)    BUN 5 (*)    All other components within normal limits  SARS CORONAVIRUS 2 (TAT 6-24 HRS)  CBC WITH DIFFERENTIAL/PLATELET  ETHANOL  RAPID URINE DRUG SCREEN, HOSP PERFORMED  TROPONIN I (HIGH SENSITIVITY)  TROPONIN I (HIGH SENSITIVITY)    EKG EKG Interpretation  Date/Time:  Saturday December 09 2020 14:52:13 EDT Ventricular Rate:  108 PR Interval:  122 QRS Duration: 98 QT Interval:  348 QTC Calculation: 466 R Axis:   98 Text Interpretation: Sinus tachycardia Rightward axis Borderline ECG Confirmed by Marianna Fuss ([id number]) on 12/09/2020 10:40:14 PM  Radiology DG Chest 2 View  Result Date: 12/09/2020 CLINICAL DATA:  Chest pain. EXAM: CHEST - 2 VIEW COMPARISON:  July 20, 2020. FINDINGS: The heart size and mediastinal contours are within normal limits. Both lungs are clear. The visualized skeletal structures are unremarkable. IMPRESSION: No active cardiopulmonary disease. Electronically Signed   By: Lupita Raider M.D.   On: 12/09/2020 15:33   CT Angio Chest PE W and/or Wo Contrast  Result Date: 12/10/2020 CLINICAL DATA:  Left-sided chest pain, palpitations, shortness of breath. Pulmonary embolus is suspected with high probability. EXAM: CT ANGIOGRAPHY CHEST WITH CONTRAST TECHNIQUE: Multidetector CT imaging of the chest was performed using the standard protocol during bolus administration of intravenous contrast. Multiplanar CT image reconstructions and MIPs were obtained to evaluate the vascular anatomy. CONTRAST:  77mL OMNIPAQUE IOHEXOL 350 MG/ML SOLN COMPARISON:  Chest radiograph 12/09/2020 FINDINGS: Cardiovascular: There is good opacification of the central and segmental pulmonary arteries. No focal filling defects. No evidence of significant pulmonary embolus. Normal heart size. No pericardial effusions. Normal caliber thoracic aorta. Mediastinum/Nodes: Thyroid gland is unremarkable. Esophagus is  decompressed. No significant lymphadenopathy in the chest. Lungs/Pleura: Lungs are clear. No pleural effusions. No pneumothorax. Upper Abdomen: No acute abnormalities demonstrated in the visualized upper abdomen. Musculoskeletal: No chest wall abnormality. No acute or significant osseous findings. Small circumscribed bone cysts suggested in the right humeral head. Review of the MIP images confirms the above findings. IMPRESSION: 1. No evidence of significant pulmonary embolus. 2. Lungs are clear. Electronically Signed   By: Burman Nieves M.D.   On: 12/10/2020 00:56    Procedures Procedures   Medications Ordered in ED Medications  potassium chloride SA (KLOR-CON) CR tablet 40 mEq (40 mEq Oral Given 12/09/20 2315)  iohexol (OMNIPAQUE) 350 MG/ML injection 75 mL (75 mLs Intravenous Contrast Given 12/10/20 0031)    ED Course  I have reviewed the triage vital signs and the nursing notes.  Pertinent labs & imaging results that were available during my care of the patient were reviewed by me and considered in my medical decision making (see chart for details).  Clinical Course as of 12/10/20 0152  Sat Dec 09, 2020  2255 Potassium(!): 3.2 Mild hypokalemia - potassium given [HM]  2255 BP(!): 141/96 HTN noted - no hx of same [HM]  2257 DG Chest 2 View Clear [HM]    Clinical Course User Index [HM] Lavonte Palos, Boyd Kerbs   MDM Rules/Calculators/A&P HEAR Score: 4                        Pt presents with chest pain onset 1 pm.  Increased risk for PE.  Pt with tachycardia and hypertension. No hypoxia.  Will obtain CTA.  ECG with tachycardia but without acute ischemia.  No elevation in troponin.  Pt continues to have ongoing chest pain at this time.   1:50 AM Patient well-appearing.  Chest pain work-up reassuring.  Troponin negative, EKG without acute ischemia.  CT angiogram without pulmonary embolism.  Suspect some element of gastritis.  Will switch from omeprazole to Protonix.  Discussed close  follow-up with primary care and reasons to return to the emergency department.  Patient states understanding and is in agreement with the plan.  The patient was discussed with and evaluated by Dr. Pilar Plate who agrees with the treatment plan.    Final Clinical Impression(s) / ED Diagnoses Final diagnoses:  Atypical chest pain    Rx / DC Orders ED Discharge Orders          Ordered    pantoprazole (PROTONIX) 40 MG tablet  Daily  12/10/20 0148             Jalie Eiland, Dahlia Client, PA-C 12/10/20 0152    Sabas Sous, MD 12/10/20 [phone]

## 2020-12-09 NOTE — ED Provider Notes (Signed)
Ultrasound ED Peripheral IV (Provider)  Date/Time: 12/09/2020 11:57 PM Performed by: Sabas Sous, MD Authorized by: Sabas Sous, MD   Procedure details:    Indications: multiple failed IV attempts     Skin Prep: chlorhexidine gluconate     Location:  Left AC   Angiocath:  20 G   Bedside Ultrasound Guided: Yes     Patient tolerated procedure without complications: Yes     Dressing applied: Yes      Sabas Sous, MD 12/09/20 2358

## 2020-12-09 NOTE — ED Triage Notes (Signed)
Pt to triage via [facility].  Reports L sided chest pain, palpitations, SOB, and increased anxiety x 30 min.

## 2020-12-10 ENCOUNTER — Emergency Department (HOSPITAL_COMMUNITY): Payer: Medicaid Other

## 2020-12-10 LAB — RAPID URINE DRUG SCREEN, HOSP PERFORMED
Amphetamines: POSITIVE — AB
Barbiturates: NOT DETECTED
Benzodiazepines: NOT DETECTED
Cocaine: POSITIVE — AB
Opiates: NOT DETECTED
Tetrahydrocannabinol: POSITIVE — AB

## 2020-12-10 LAB — ETHANOL: Alcohol, Ethyl (B): <10 mg/dL (ref <10)

## 2020-12-10 LAB — SARS CORONAVIRUS 2 (TAT 6-24 HRS): SARS Coronavirus 2: NEGATIVE

## 2020-12-10 MED ORDER — IOHEXOL 350 MG/ML SOLN
75.0000 mL | Freq: Once | INTRAVENOUS | Status: AC | PRN
  Administered 2020-12-10: 75 mL via INTRAVENOUS

## 2020-12-10 MED ORDER — PANTOPRAZOLE SODIUM 40 MG PO TBEC: 40.0000 mg | DELAYED_RELEASE_TABLET | Freq: Every day | ORAL | 0 refills | Status: DC

## 2020-12-10 NOTE — Discharge Instructions (Addendum)
1. Medications: protonix, usual home medications 2. Treatment: rest, drink plenty of fluids,  3. Follow Up: Please followup with your primary doctor in 2-3 days for discussion of your diagnoses and further evaluation after today's visit; if you do not have a primary care doctor use the resource guide provided to find one; Please return to the ER for worsening symptoms or other concerns

## 2020-12-18 ENCOUNTER — Emergency Department (HOSPITAL_COMMUNITY)
Admission: EM | Admit: 2020-12-18 | Discharge: 2020-12-19 | Disposition: A | Discharge status: Home / Self Care | Payer: Medicaid Other | Attending: Emergency Medicine | Admitting: Emergency Medicine

## 2020-12-18 DIAGNOSIS — Z5321 Procedure and treatment not carried out due to patient leaving prior to being seen by health care provider: Secondary | ICD-10-CM | POA: Insufficient documentation

## 2020-12-18 DIAGNOSIS — S81012D Laceration without foreign body, left knee, subsequent encounter: Secondary | ICD-10-CM | POA: Insufficient documentation

## 2020-12-18 DIAGNOSIS — X58XXXD Exposure to other specified factors, subsequent encounter: Secondary | ICD-10-CM | POA: Insufficient documentation

## 2020-12-18 DIAGNOSIS — Z4802 Encounter for removal of sutures: Secondary | ICD-10-CM | POA: Insufficient documentation

## 2020-12-18 DIAGNOSIS — R531 Weakness: Secondary | ICD-10-CM | POA: Diagnosis not present

## 2020-12-18 NOTE — ED Triage Notes (Signed)
Pt bib [facility] for suture removal on L knee from chainsaw wound 6/27. No additional complaints  VSS

## 2020-12-18 NOTE — ED Notes (Signed)
Patient called for vitals with no response 

## 2020-12-18 NOTE — ED Provider Notes (Signed)
Emergency Medicine Provider Triage Evaluation Note  Robert Eaton , a 35 y.o. male  was evaluated in triage.  Pt presenting for suture removal.  Arrived via EMS.  Review of Systems  Positive: Suture removal Negative: Gait trouble  Physical Exam  There were no vitals taken for this visit. Gen:   Awake, no distress   Resp:  Normal effort  MSK:   Moves extremities without difficulty  Other:  Sutures L knee  Medical Decision Making  Medically screening exam initiated at 11:19 PM.  Appropriate orders placed.  Robert Eaton was informed that the remainder of the evaluation will be completed by another provider, this initial triage assessment does not replace that evaluation, and the importance of remaining in the ED until their evaluation is complete.  Suture removal   Antony Madura, PA-C 12/18/20 2320    Shon Baton, MD 12/19/20 [phone]

## 2020-12-19 NOTE — ED Notes (Signed)
Patient called for vitals with no response. hasnt been seen since he checked in

## 2021-01-06 ENCOUNTER — Emergency Department (HOSPITAL_COMMUNITY): Payer: Medicaid Other

## 2021-01-06 ENCOUNTER — Other Ambulatory Visit: Payer: Self-pay

## 2021-01-06 ENCOUNTER — Emergency Department (HOSPITAL_COMMUNITY)
Admission: EM | Admit: 2021-01-06 | Discharge: 2021-01-06 | Disposition: A | Discharge status: Home / Self Care | Payer: Medicaid Other | Attending: Emergency Medicine | Admitting: Emergency Medicine

## 2021-01-06 DIAGNOSIS — Z5321 Procedure and treatment not carried out due to patient leaving prior to being seen by health care provider: Secondary | ICD-10-CM | POA: Insufficient documentation

## 2021-01-06 DIAGNOSIS — J449 Chronic obstructive pulmonary disease, unspecified: Secondary | ICD-10-CM | POA: Insufficient documentation

## 2021-01-06 DIAGNOSIS — R509 Fever, unspecified: Secondary | ICD-10-CM | POA: Diagnosis not present

## 2021-01-06 DIAGNOSIS — R0602 Shortness of breath: Secondary | ICD-10-CM | POA: Insufficient documentation

## 2021-01-06 MED ORDER — ALBUTEROL SULFATE HFA 108 (90 BASE) MCG/ACT IN AERS: 2.0000 | INHALATION_SPRAY | RESPIRATORY_TRACT | Status: DC | PRN

## 2021-01-06 NOTE — ED Notes (Signed)
Called Pt for vitals no answer. 

## 2021-01-06 NOTE — ED Triage Notes (Signed)
Pt BIB [facility] c/o SOB HX COPD and GERD. Continues to smoke. States he would like help with medication, has been noncompliant due to financial reasons. Resp unlabored, no wheezing, no acute resp distress. Denies CP

## 2021-03-12 ENCOUNTER — Encounter (HOSPITAL_COMMUNITY): Payer: Self-pay | Admitting: Emergency Medicine

## 2021-03-12 ENCOUNTER — Emergency Department (HOSPITAL_COMMUNITY): Payer: Self-pay

## 2021-03-12 ENCOUNTER — Emergency Department (HOSPITAL_COMMUNITY)
Admission: EM | Admit: 2021-03-12 | Discharge: 2021-03-12 | Disposition: A | Discharge status: Home / Self Care | Payer: Self-pay | Attending: Emergency Medicine | Admitting: Emergency Medicine

## 2021-03-12 DIAGNOSIS — R0602 Shortness of breath: Secondary | ICD-10-CM | POA: Insufficient documentation

## 2021-03-12 DIAGNOSIS — M79602 Pain in left arm: Secondary | ICD-10-CM | POA: Insufficient documentation

## 2021-03-12 DIAGNOSIS — Z5321 Procedure and treatment not carried out due to patient leaving prior to being seen by health care provider: Secondary | ICD-10-CM | POA: Insufficient documentation

## 2021-03-12 HISTORY — DX: Chronic obstructive pulmonary disease, unspecified: J44.9

## 2021-03-12 LAB — BASIC METABOLIC PANEL
Anion gap: 8 (ref 5–15)
BUN: 8 mg/dL (ref 6–20)
CO2: 23 mmol/L (ref 22–32)
Calcium: 9 mg/dL (ref 8.9–10.3)
Chloride: 105 mmol/L (ref 98–111)
Creatinine, Ser: 0.86 mg/dL (ref 0.61–1.24)
GFR, Estimated: >60 mL/min (ref >60)
Glucose, Bld: 110 mg/dL — ABNORMAL HIGH (ref 70–99)
Potassium: 3.9 mmol/L (ref 3.5–5.1)
Sodium: 136 mmol/L (ref 135–145)

## 2021-03-12 LAB — CBC WITH DIFFERENTIAL/PLATELET
Abs Immature Granulocytes: 0.03 10*3/uL (ref 0.00–0.07)
Basophils Absolute: 0 10*3/uL (ref 0.0–0.1)
Basophils Relative: 1 %
Eosinophils Absolute: 0.2 10*3/uL (ref 0.0–0.5)
Eosinophils Relative: 3 %
HCT: 46 % (ref 39.0–52.0)
Hemoglobin: 14.2 g/dL (ref 13.0–17.0)
Immature Granulocytes: 0 %
Lymphocytes Relative: 21 %
Lymphs Abs: 1.8 10*3/uL (ref 0.7–4.0)
MCH: 27 pg (ref 26.0–34.0)
MCHC: 30.9 g/dL (ref 30.0–36.0)
MCV: 87.5 fL (ref 80.0–100.0)
Monocytes Absolute: 0.6 10*3/uL (ref 0.1–1.0)
Monocytes Relative: 7 %
Neutro Abs: 5.7 10*3/uL (ref 1.7–7.7)
Neutrophils Relative %: 68 %
Platelets: 373 10*3/uL (ref 150–400)
RBC: 5.26 MIL/uL (ref 4.22–5.81)
RDW: 13 % (ref 11.5–15.5)
WBC: 8.3 10*3/uL (ref 4.0–10.5)
nRBC: 0 % (ref 0.0–0.2)

## 2021-03-12 NOTE — ED Provider Notes (Signed)
Emergency Medicine Provider Triage Evaluation Note  Chigozie Basaldua , a 35 y.o. male  was evaluated in triage.  Pt complains of left arm pain.  States it began earlier this morning when he smoked his friends marijuana.  States that pain is intermittent, originates in his distal left arm and radiates into his chest and causes associated shortness of breath.  States this is never happened before.  Review of Systems  Positive: Left arm pain Negative: Fevers, chills, nausea, vomiting  Physical Exam  BP 132/89 (BP Location: Right Arm)   Pulse 73   Temp 98.7 F (37.1 C) (Oral)   Resp 17   SpO2 99%  Gen:   Awake, no distress  Resp:  Normal effort, lungs clear to auscultation in all fields MSK:   Moves extremities without difficulty, full ROM in left arm   Medical Decision Making  Medically screening exam initiated at 11:43 AM.  Appropriate orders placed.  Dewaun Kinzler was informed that the remainder of the evaluation will be completed by another provider, this initial triage assessment does not replace that evaluation, and the importance of remaining in the ED until their evaluation is complete.    Vear Clock 03/12/21 1148    Bethann Berkshire, MD 03/17/21 1038

## 2021-03-12 NOTE — ED Notes (Signed)
Pt not responding for vitals  

## 2021-03-12 NOTE — ED Notes (Signed)
X ray called patient to get chest X ray on, unable to locate at this time.

## 2021-03-12 NOTE — ED Triage Notes (Signed)
Patient BIB [facility] from home with complaint of sudden onset of left arm pain after smoking someone else's cannabis this morning. Patient alert, oriented, and in no apparent distress at this time.

## 2021-03-24 ENCOUNTER — Encounter (HOSPITAL_COMMUNITY): Payer: Self-pay | Admitting: *Deleted

## 2021-03-24 ENCOUNTER — Emergency Department (HOSPITAL_COMMUNITY): Payer: Medicaid Other

## 2021-03-24 ENCOUNTER — Emergency Department (HOSPITAL_COMMUNITY)
Admission: EM | Admit: 2021-03-24 | Discharge: 2021-03-25 | Disposition: A | Discharge status: Home / Self Care | Payer: Medicaid Other | Attending: Emergency Medicine | Admitting: Emergency Medicine

## 2021-03-24 ENCOUNTER — Other Ambulatory Visit: Payer: Self-pay

## 2021-03-24 DIAGNOSIS — R053 Chronic cough: Secondary | ICD-10-CM | POA: Insufficient documentation

## 2021-03-24 DIAGNOSIS — F17210 Nicotine dependence, cigarettes, uncomplicated: Secondary | ICD-10-CM | POA: Insufficient documentation

## 2021-03-24 DIAGNOSIS — M542 Cervicalgia: Secondary | ICD-10-CM | POA: Insufficient documentation

## 2021-03-24 DIAGNOSIS — F1410 Cocaine abuse, uncomplicated: Secondary | ICD-10-CM | POA: Insufficient documentation

## 2021-03-24 DIAGNOSIS — R5383 Other fatigue: Secondary | ICD-10-CM | POA: Insufficient documentation

## 2021-03-24 DIAGNOSIS — R0789 Other chest pain: Secondary | ICD-10-CM | POA: Insufficient documentation

## 2021-03-24 DIAGNOSIS — J449 Chronic obstructive pulmonary disease, unspecified: Secondary | ICD-10-CM | POA: Insufficient documentation

## 2021-03-24 DIAGNOSIS — R002 Palpitations: Secondary | ICD-10-CM | POA: Insufficient documentation

## 2021-03-24 HISTORY — DX: Cardiac arrhythmia, unspecified: I49.9

## 2021-03-24 LAB — CBC WITH DIFFERENTIAL/PLATELET
Abs Immature Granulocytes: 0.02 10*3/uL (ref 0.00–0.07)
Basophils Absolute: 0.1 10*3/uL (ref 0.0–0.1)
Basophils Relative: 1 %
Eosinophils Absolute: 0.3 10*3/uL (ref 0.0–0.5)
Eosinophils Relative: 5 %
HCT: 41.5 % (ref 39.0–52.0)
Hemoglobin: 13.4 g/dL (ref 13.0–17.0)
Immature Granulocytes: 0 %
Lymphocytes Relative: 43 %
Lymphs Abs: 3 10*3/uL (ref 0.7–4.0)
MCH: 28 pg (ref 26.0–34.0)
MCHC: 32.3 g/dL (ref 30.0–36.0)
MCV: 86.6 fL (ref 80.0–100.0)
Monocytes Absolute: 0.5 10*3/uL (ref 0.1–1.0)
Monocytes Relative: 7 %
Neutro Abs: 3.1 10*3/uL (ref 1.7–7.7)
Neutrophils Relative %: 44 %
Platelets: 231 10*3/uL (ref 150–400)
RBC: 4.79 MIL/uL (ref 4.22–5.81)
RDW: 13.3 % (ref 11.5–15.5)
WBC: 7.1 10*3/uL (ref 4.0–10.5)
nRBC: 0 % (ref 0.0–0.2)

## 2021-03-24 LAB — BASIC METABOLIC PANEL
Anion gap: 8 (ref 5–15)
BUN: 7 mg/dL (ref 6–20)
CO2: 24 mmol/L (ref 22–32)
Calcium: 9 mg/dL (ref 8.9–10.3)
Chloride: 105 mmol/L (ref 98–111)
Creatinine, Ser: 0.78 mg/dL (ref 0.61–1.24)
GFR, Estimated: >60 mL/min (ref >60)
Glucose, Bld: 102 mg/dL — ABNORMAL HIGH (ref 70–99)
Potassium: 3.5 mmol/L (ref 3.5–5.1)
Sodium: 137 mmol/L (ref 135–145)

## 2021-03-24 LAB — TROPONIN I (HIGH SENSITIVITY): Troponin I (High Sensitivity): 3 ng/L (ref <18)

## 2021-03-24 NOTE — ED Provider Notes (Signed)
Emergency Medicine Provider Triage Evaluation Note  Robert Eaton , a 35 y.o. male  was evaluated in triage.  Pt complains of chest pain.  States he has felt funny all day today, chest pain up into left side of neck and causing left hand to feel numb.  Also reports sweaty palms and feeling lightheaded.  Does have cough related to his COPD.  Denies fever/chills.  No cardiac history.  Review of Systems  Positive: Chest pain Negative: Nausea, vomiting  Physical Exam  BP (!) 141/104 (BP Location: Left Arm)   Pulse 76   Temp 98.2 F (36.8 C) (Oral)   Resp 15   SpO2 97%  Gen:   Awake, no distress   Resp:  Normal effort  MSK:   Moves extremities without difficulty  Other:    Medical Decision Making  Medically screening exam initiated at 10:40 PM.  Appropriate orders placed.  Shante Maysonet was informed that the remainder of the evaluation will be completed by another provider, this initial triage assessment does not replace that evaluation, and the importance of remaining in the ED until their evaluation is complete.  Chest pain.  No cardiac history, does have COPD.  VSS.  EKG, labs, CXR.   Garlon Hatchet, PA-C 03/24/21 2243    Arby Barrette, MD 03/28/21 1714

## 2021-03-24 NOTE — ED Triage Notes (Addendum)
PT states L sided chest pain, acute onset today, that radiates to L arm and up L side of neck.  Pain is positional.

## 2021-03-25 LAB — TROPONIN I (HIGH SENSITIVITY): Troponin I (High Sensitivity): 2 ng/L (ref <18)

## 2021-03-25 MED ORDER — OMEPRAZOLE 20 MG PO CPDR: 20.0000 mg | DELAYED_RELEASE_CAPSULE | Freq: Every day | ORAL | 0 refills | Status: DC

## 2021-03-25 MED ORDER — ALBUTEROL SULFATE HFA 108 (90 BASE) MCG/ACT IN AERS: 1.0000 | INHALATION_SPRAY | Freq: Four times a day (QID) | RESPIRATORY_TRACT | 0 refills | Status: AC | PRN

## 2021-03-25 NOTE — ED Notes (Signed)
Discharged by PA at triage. 

## 2021-03-25 NOTE — ED Provider Notes (Signed)
[facility] EMERGENCY DEPARTMENT Provider Note   CSN: [id number] Arrival date & time: 03/24/21  2131     History Chief Complaint  Patient presents with   Chest Pain    Robert Eaton is a 35 y.o. male.  HPI Patient is a 35 year old male with past medical history significant for cocaine use, history of palpitations, COPD, chronic tobacco user  At the time of my interview patient has been in the waiting room for 13.5 hours.  He states he has no symptoms at all at this time.  Feels somewhat hungry and fatigued.  Initially came into the ER for chest pain.  He states that he last used cocaine yesterday morning.  States that later in the day yesterday he began developing some achy nonexertional nonpleuritic chest pressure.  States that he became somewhat more severe later in the day prompting him to come to the ER.  He states that his symptoms seem to come on relatively abruptly.  He states that the symptoms were nonradiating.  He denies any associated nausea vomiting or shortness of breath.  Did notice that he had some heart palpitations.  He has a history of similar symptoms.  He states that these do not seem to consistently occur after cocaine use.  Denies any or other recreational drug use.  He states that he did have some neck pain as well but feels that this was not radiating pain but rather a separate area of discomfort.  Denies any syncope or near syncope.  He states he has a chronic cough from his COPD and continues to smoke has no new cough no production of sputum.  No hemoptysis.  No history of VTE.  No lower extremity swelling or pain per the patient.      Past Medical History:  Diagnosis Date   COPD (chronic obstructive pulmonary disease) (HCC)    Irregular heartbeat     There are no problems to display for this patient.   History reviewed. No pertinent surgical history.     No family history on file.  Social History   Tobacco Use   Smoking  status: Every Day    Packs/day: 1.50    Types: Cigarettes   Smokeless tobacco: Never  Substance Use Topics   Alcohol use: Yes    Comment: several beers per day   Drug use: Yes    Types: Marijuana    Home Medications Prior to Admission medications   Medication Sig Start Date End Date Taking? Authorizing Provider  albuterol (VENTOLIN HFA) 108 (90 Base) MCG/ACT inhaler Inhale 1-2 puffs into the lungs every 6 (six) hours as needed for wheezing or shortness of breath. 03/25/21  Yes Greydon Betke S, PA  omeprazole (PRILOSEC) 20 MG capsule Take 1 capsule (20 mg total) by mouth daily. 03/25/21  Yes Norabelle Kondo S, PA  cephALEXin (KEFLEX) 500 MG capsule Take 1 capsule (500 mg total) by mouth 4 (four) times daily. 12/08/20   Antony Madura, PA-C  ibuprofen (ADVIL) 200 MG tablet Take 1,200-1,400 mg by mouth every 8 (eight) hours as needed for mild pain.    [provider]    Allergies    Patient has no known allergies.  Review of Systems   Review of Systems  Constitutional:  Negative for chills and fever.  HENT:  Negative for congestion.   Eyes:  Negative for pain.  Respiratory:  Negative for cough and shortness of breath.   Cardiovascular:  Positive for chest pain and palpitations. Negative  for leg swelling.  Gastrointestinal:  Negative for abdominal pain and vomiting.  Genitourinary:  Negative for dysuria.  Musculoskeletal:  Negative for myalgias.  Skin:  Negative for rash.  Neurological:  Negative for dizziness and headaches.   Physical Exam Updated Vital Signs BP 110/80 (BP Location: Left Arm)   Pulse 64   Temp 97.8 F (36.6 C)   Resp 18   SpO2 100%   Physical Exam Vitals and nursing note reviewed.  Constitutional:      General: He is not in acute distress.    Comments: Somewhat disheveled appearing 35 year old male pleasant, able answer questions appropriate follow commands  HENT:     Head: Normocephalic and atraumatic.     Nose: Nose normal.  Eyes:      General: No scleral icterus. Cardiovascular:     Rate and Rhythm: Normal rate and regular rhythm.     Pulses: Normal pulses.     Heart sounds: Normal heart sounds.  Pulmonary:     Effort: Pulmonary effort is normal. No respiratory distress.     Breath sounds: No wheezing.  Abdominal:     Palpations: Abdomen is soft.     Tenderness: There is no abdominal tenderness. There is no guarding or rebound.  Musculoskeletal:     Cervical back: Normal range of motion.     Right lower leg: No edema.     Left lower leg: No edema.     Comments: Bilateral lower extremities are symmetric no calf tenderness  Skin:    General: Skin is warm and dry.     Capillary Refill: Capillary refill takes less than 2 seconds.  Neurological:     Mental Status: He is alert. Mental status is at baseline.  Psychiatric:        Mood and Affect: Mood normal.        Behavior: Behavior normal.    ED Results / Procedures / Treatments   Labs (all labs ordered are listed, but only abnormal results are displayed) Labs Reviewed  BASIC METABOLIC PANEL - Abnormal; Notable for the following components:      Result Value   Glucose, Bld 102 (*)    All other components within normal limits  CBC WITH DIFFERENTIAL/PLATELET  TROPONIN I (HIGH SENSITIVITY)  TROPONIN I (HIGH SENSITIVITY)    EKG None  Radiology DG Chest 2 View  Result Date: 03/24/2021 CLINICAL DATA:  Chest pain EXAM: CHEST - 2 VIEW COMPARISON:  03/12/2021 FINDINGS: Lungs are clear.  No pleural effusion or pneumothorax. The heart is normal in size. Visualized osseous structures are within normal limits. IMPRESSION: Normal chest radiographs. Electronically Signed   By: Charline Bills M.D.   On: 03/24/2021 23:07    Procedures Procedures Medications Ordered in ED Medications - No data to display  ED Course  I have reviewed the triage vital signs and the nursing notes.  Pertinent labs & imaging results that were available during my care of the patient  were reviewed by me and considered in my medical decision making (see chart for details).    MDM Rules/Calculators/A&P                          Patient is a 35 year old male history of cocaine use and chronic tobacco abuse  Is presented to the ER today with now resolved symptoms of heart palpitations and chest pressure.  He has had delta troponins which were within normal limits CBC and BMP unremarkable.  Chest  x-ray without infiltrate or acute abnormality  EKG with no evident ischemia.  Does not appear changed from prior.  Discussed with patient during a long conversation the need for follow-up with PCP and the need to stop using cocaine and tobacco.  He understands this.  Medication sent to pharmacy include albuterol and omeprazole he states that he supposed be on a inhaler for COPD but has never used 1.  I provided this to him but did inform him that it could cause some palpitations.  Also prescribed his Prilosec.  Offered nicotine patches which he declined.  Final Clinical Impression(s) / ED Diagnoses Final diagnoses:  Atypical chest pain  Cocaine abuse (HCC)    Rx / DC Orders ED Discharge Orders          Ordered    albuterol (VENTOLIN HFA) 108 (90 Base) MCG/ACT inhaler  Every 6 hours PRN        03/25/21 1110    omeprazole (PRILOSEC) 20 MG capsule  Daily        03/25/21 1110             Solon [place], [place] 03/25/21 1407    Margarita Grizzle, MD 03/25/21 1420

## 2021-03-25 NOTE — Discharge Instructions (Addendum)
Please stop using cocaine. Please stop smoking cigarettes.  These are both incredibly difficult things to cease using however it is critically important for your health and wellbeing.  I have given you the information for the [facility] health wellness clinic. I would strongly encourage you to follow-up with this clinic to have a full wellness exam.

## 2021-03-31 ENCOUNTER — Emergency Department (HOSPITAL_COMMUNITY)
Admission: EM | Admit: 2021-03-31 | Discharge: 2021-03-31 | Disposition: A | Discharge status: Home / Self Care | Payer: Medicaid Other | Attending: Emergency Medicine | Admitting: Emergency Medicine

## 2021-03-31 ENCOUNTER — Emergency Department (HOSPITAL_COMMUNITY): Payer: Medicaid Other

## 2021-03-31 ENCOUNTER — Other Ambulatory Visit: Payer: Self-pay

## 2021-03-31 ENCOUNTER — Encounter (HOSPITAL_COMMUNITY): Payer: Self-pay | Admitting: Emergency Medicine

## 2021-03-31 DIAGNOSIS — R0789 Other chest pain: Secondary | ICD-10-CM | POA: Diagnosis not present

## 2021-03-31 DIAGNOSIS — F17210 Nicotine dependence, cigarettes, uncomplicated: Secondary | ICD-10-CM | POA: Insufficient documentation

## 2021-03-31 DIAGNOSIS — J449 Chronic obstructive pulmonary disease, unspecified: Secondary | ICD-10-CM | POA: Insufficient documentation

## 2021-03-31 DIAGNOSIS — R079 Chest pain, unspecified: Secondary | ICD-10-CM | POA: Diagnosis not present

## 2021-03-31 LAB — PROTIME-INR
INR: 1.2 (ref 0.8–1.2)
Prothrombin Time: 14.7 seconds (ref 11.4–15.2)

## 2021-03-31 LAB — BASIC METABOLIC PANEL
Anion gap: 7 (ref 5–15)
BUN: 12 mg/dL (ref 6–20)
CO2: 27 mmol/L (ref 22–32)
Calcium: 9.2 mg/dL (ref 8.9–10.3)
Chloride: 105 mmol/L (ref 98–111)
Creatinine, Ser: 0.79 mg/dL (ref 0.61–1.24)
GFR, Estimated: >60 mL/min (ref >60)
Glucose, Bld: 100 mg/dL — ABNORMAL HIGH (ref 70–99)
Potassium: 4 mmol/L (ref 3.5–5.1)
Sodium: 139 mmol/L (ref 135–145)

## 2021-03-31 LAB — CBC
HCT: 42.7 % (ref 39.0–52.0)
Hemoglobin: 13.3 g/dL (ref 13.0–17.0)
MCH: 27.2 pg (ref 26.0–34.0)
MCHC: 31.1 g/dL (ref 30.0–36.0)
MCV: 87.3 fL (ref 80.0–100.0)
Platelets: 248 10*3/uL (ref 150–400)
RBC: 4.89 MIL/uL (ref 4.22–5.81)
RDW: 13.7 % (ref 11.5–15.5)
WBC: 5.7 10*3/uL (ref 4.0–10.5)
nRBC: 0 % (ref 0.0–0.2)

## 2021-03-31 LAB — TROPONIN I (HIGH SENSITIVITY)
Troponin I (High Sensitivity): <2 ng/L (ref <18)
Troponin I (High Sensitivity): <2 ng/L (ref <18)

## 2021-03-31 MED ORDER — KETOROLAC TROMETHAMINE 60 MG/2ML IM SOLN
60.0000 mg | Freq: Once | INTRAMUSCULAR | Status: AC
  Administered 2021-03-31: 60 mg via INTRAMUSCULAR
  Filled 2021-03-31: qty 2

## 2021-03-31 NOTE — ED Triage Notes (Signed)
Patient arrived with EMS from home reports left chest pain this evening , took ASA 325 mg and 1 NTG sl prior to arrival . Denies SOB , no emesis or diaphoresis .

## 2021-03-31 NOTE — ED Notes (Signed)
Pt discharged and wheeled out of the ED in a wheel chair without difficulty. 

## 2021-03-31 NOTE — Discharge Instructions (Signed)
Take ibuprofen 600 mg 3 times daily for the next 5 days.  Rest.  Follow-up with primary doctor if symptoms are not improving in the next 3 to 4 days.

## 2021-03-31 NOTE — ED Provider Notes (Addendum)
[facility] EMERGENCY DEPARTMENT Provider Note   CSN: [id number] Arrival date & time: 03/31/21  0046     History Chief Complaint  Patient presents with   Chest Pain    Robert Eaton is a 35 y.o. male.  Patient is a 35 year old male with history of COPD.  Patient presenting today with complaints of chest pain.  He started this evening with sharp pain in the center of his chest with no associated nausea, diaphoresis, or radiation.  Pain is sharp in nature and worse when he moves and with palpation.  Patient has no prior cardiac history and no cardiac risk factors.  He does work [employer] so does perform heavy lifting and repetitive movements.  He has been seen here on multiple other occasions with similar complaints, however work-up has never shown any abnormality.  The history is provided by the patient.      Past Medical History:  Diagnosis Date   COPD (chronic obstructive pulmonary disease) (HCC)    Irregular heartbeat     There are no problems to display for this patient.   History reviewed. No pertinent surgical history.     No family history on file.  Social History   Tobacco Use   Smoking status: Every Day    Packs/day: 1.50    Types: Cigarettes   Smokeless tobacco: Never  Substance Use Topics   Alcohol use: Yes    Comment: several beers per day   Drug use: Yes    Types: Marijuana    Home Medications Prior to Admission medications   Medication Sig Start Date End Date Taking? Authorizing Provider  albuterol (VENTOLIN HFA) 108 (90 Base) MCG/ACT inhaler Inhale 1-2 puffs into the lungs every 6 (six) hours as needed for wheezing or shortness of breath. 03/25/21   Gailen Shelter, PA  cephALEXin (KEFLEX) 500 MG capsule Take 1 capsule (500 mg total) by mouth 4 (four) times daily. 12/08/20   Antony Madura, PA-C  ibuprofen (ADVIL) 200 MG tablet Take 1,200-1,400 mg by mouth every 8 (eight) hours as needed for mild pain.    [provider]  omeprazole (PRILOSEC) 20 MG capsule Take 1 capsule (20 mg total) by mouth daily. 03/25/21   Gailen Shelter, PA    Allergies    Patient has no known allergies.  Review of Systems   Review of Systems  All other systems reviewed and are negative.  Physical Exam Updated Vital Signs BP 124/90 (BP Location: Left Arm)   Pulse 79   Temp 97.7 F (36.5 C) (Oral)   Resp 15   Ht 5\' 9"  (1.753 m)   Wt 92 kg   SpO2 100%   BMI 29.95 kg/m   Physical Exam Vitals and nursing note reviewed.  Constitutional:      General: He is not in acute distress.    Appearance: He is well-developed. He is not diaphoretic.  HENT:     Head: Normocephalic and atraumatic.  Cardiovascular:     Rate and Rhythm: Normal rate and regular rhythm.     Heart sounds: No murmur heard.   No friction rub.  Pulmonary:     Effort: Pulmonary effort is normal. No respiratory distress.     Breath sounds: Normal breath sounds. No wheezing or rales.     Comments: There is tenderness to palpation to the upper sternum which reproduces his symptoms. Abdominal:     General: Bowel sounds are normal. There is no distension.  Palpations: Abdomen is soft.     Tenderness: There is no abdominal tenderness.  Musculoskeletal:        General: Normal range of motion.     Cervical back: Normal range of motion and neck supple.     Right lower leg: No tenderness. No edema.     Left lower leg: No tenderness. No edema.  Skin:    General: Skin is warm and dry.  Neurological:     Mental Status: He is alert and oriented to person, place, and time.     Coordination: Coordination normal.    ED Results / Procedures / Treatments   Labs (all labs ordered are listed, but only abnormal results are displayed) Labs Reviewed  BASIC METABOLIC PANEL - Abnormal; Notable for the following components:      Result Value   Glucose, Bld 100 (*)    All other components within normal limits  CBC  PROTIME-INR  TROPONIN I (HIGH SENSITIVITY)   TROPONIN I (HIGH SENSITIVITY)    EKG EKG Interpretation  Date/Time:  Saturday March 31 2021 00:56:18 EDT Ventricular Rate:  80 PR Interval:  130 QRS Duration: 104 QT Interval:  378 QTC Calculation: 435 R Axis:   93 Text Interpretation: Normal sinus rhythm Rightward axis Borderline ECG Confirmed by Geoffery Lyons ([id number]) on 03/31/2021 5:42:02 AM  Radiology DG Chest 2 View  Result Date: 03/31/2021 CLINICAL DATA:  Chest pain. EXAM: CHEST - 2 VIEW COMPARISON:  Chest radiograph dated 03/24/2021. FINDINGS: The heart size and mediastinal contours are within normal limits. Both lungs are clear. The visualized skeletal structures are unremarkable. IMPRESSION: No active cardiopulmonary disease. Electronically Signed   By: Elgie Collard M.D.   On: 03/31/2021 01:40    Procedures Procedures   Medications Ordered in ED Medications - No data to display  ED Course  I have reviewed the triage vital signs and the nursing notes.  Pertinent labs & imaging results that were available during my care of the patient were reviewed by me and considered in my medical decision making (see chart for details).    MDM Rules/Calculators/A&P  Patient presenting here with chest pain that is atypical for cardiac pain.  His EKG is unchanged and troponin x2 is negative.  Chest x-ray is clear.  I highly suspect a musculoskeletal etiology to his symptoms and feel as though he can safely be discharged.  He will receive a shot of Toradol and is to take ibuprofen for the next several days.  Final Clinical Impression(s) / ED Diagnoses Final diagnoses:  None    Rx / DC Orders ED Discharge Orders     None        Geoffery Lyons, MD 03/31/21 1914    Geoffery Lyons, MD 03/31/21 [phone]

## 2021-06-14 DIAGNOSIS — Z1388 Encounter for screening for disorder due to exposure to contaminants: Secondary | ICD-10-CM | POA: Diagnosis not present

## 2021-06-14 DIAGNOSIS — Z0389 Encounter for observation for other suspected diseases and conditions ruled out: Secondary | ICD-10-CM | POA: Diagnosis not present

## 2021-06-14 DIAGNOSIS — Z3009 Encounter for other general counseling and advice on contraception: Secondary | ICD-10-CM | POA: Diagnosis not present

## 2021-07-13 ENCOUNTER — Encounter (HOSPITAL_COMMUNITY): Payer: Self-pay | Admitting: Emergency Medicine

## 2021-07-13 ENCOUNTER — Emergency Department (HOSPITAL_COMMUNITY)
Admission: EM | Admit: 2021-07-13 | Discharge: 2021-07-13 | Disposition: A | Discharge status: Home / Self Care | Payer: Self-pay | Attending: Physician Assistant | Admitting: Physician Assistant

## 2021-07-13 ENCOUNTER — Other Ambulatory Visit: Payer: Self-pay

## 2021-07-13 ENCOUNTER — Emergency Department (HOSPITAL_COMMUNITY): Payer: Self-pay

## 2021-07-13 DIAGNOSIS — S0990XA Unspecified injury of head, initial encounter: Secondary | ICD-10-CM | POA: Insufficient documentation

## 2021-07-13 NOTE — Discharge Instructions (Signed)
Please follow up with your primary care provider within 5-7 days for re-evaluation of your symptoms. If you do not have a primary care provider, information for a healthcare clinic has been provided for you to make arrangements for follow up care. Please return to the emergency department for any new or worsening symptoms. ° °
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
## 2022-01-11 ENCOUNTER — Emergency Department (HOSPITAL_COMMUNITY)
Admission: EM | Admit: 2022-01-11 | Discharge: 2022-01-11 | Disposition: A | Discharge status: Home / Self Care | Payer: Medicaid Other | Attending: Emergency Medicine | Admitting: Emergency Medicine

## 2022-01-11 ENCOUNTER — Encounter (HOSPITAL_COMMUNITY): Payer: Self-pay | Admitting: Emergency Medicine

## 2022-01-11 ENCOUNTER — Emergency Department (HOSPITAL_COMMUNITY): Payer: Medicaid Other

## 2022-01-11 ENCOUNTER — Other Ambulatory Visit: Payer: Self-pay

## 2022-01-11 DIAGNOSIS — Z5321 Procedure and treatment not carried out due to patient leaving prior to being seen by health care provider: Secondary | ICD-10-CM | POA: Insufficient documentation

## 2022-01-11 DIAGNOSIS — R079 Chest pain, unspecified: Secondary | ICD-10-CM | POA: Insufficient documentation

## 2022-01-11 DIAGNOSIS — R0600 Dyspnea, unspecified: Secondary | ICD-10-CM | POA: Insufficient documentation

## 2022-01-11 DIAGNOSIS — F17290 Nicotine dependence, other tobacco product, uncomplicated: Secondary | ICD-10-CM | POA: Insufficient documentation

## 2022-01-11 LAB — CBC
HCT: 40.8 % (ref 39.0–52.0)
Hemoglobin: 13.4 g/dL (ref 13.0–17.0)
MCH: 26.1 pg (ref 26.0–34.0)
MCHC: 32.8 g/dL (ref 30.0–36.0)
MCV: 79.5 fL — ABNORMAL LOW (ref 80.0–100.0)
Platelets: 367 10*3/uL (ref 150–400)
RBC: 5.13 MIL/uL (ref 4.22–5.81)
RDW: 16.1 % — ABNORMAL HIGH (ref 11.5–15.5)
WBC: 10.1 10*3/uL (ref 4.0–10.5)
nRBC: 0 % (ref 0.0–0.2)

## 2022-01-11 LAB — BASIC METABOLIC PANEL
Anion gap: 11 (ref 5–15)
BUN: 12 mg/dL (ref 6–20)
CO2: 25 mmol/L (ref 22–32)
Calcium: 9.4 mg/dL (ref 8.9–10.3)
Chloride: 99 mmol/L (ref 98–111)
Creatinine, Ser: 1.06 mg/dL (ref 0.61–1.24)
GFR, Estimated: >60 mL/min (ref >60)
Glucose, Bld: 124 mg/dL — ABNORMAL HIGH (ref 70–99)
Potassium: 3.2 mmol/L — ABNORMAL LOW (ref 3.5–5.1)
Sodium: 135 mmol/L (ref 135–145)

## 2022-01-11 LAB — TROPONIN I (HIGH SENSITIVITY)
Troponin I (High Sensitivity): 2 ng/L (ref <18)
Troponin I (High Sensitivity): 3 ng/L (ref <18)

## 2022-01-11 NOTE — ED Triage Notes (Signed)
Pt BIB EMS from home  Pt reports smoking crack tonight and drinking one beer.  Afterward started experiencing CP.  Pt was given 324 ASA and 1 nitro.  18G LH VSS

## 2022-01-11 NOTE — ED Notes (Signed)
No answer from pt in waiting room/ x1

## 2022-01-11 NOTE — ED Provider Triage Note (Signed)
  Emergency Medicine Provider Triage Evaluation Note  MRN:  [id number]  Arrival date & time: 01/11/22    Medically screening exam initiated at 2:34 AM.   CC:   Chest Pain   HPI:  Robert Eaton is a 36 y.o. year-old male presents to the ED with chief complaint of chest pain after smoking crack and drinking beer.  Brought in by EMS.  Got ASA and nitro PTA.  Reports that his chest felt tight and he was having some trouble breathing.  States that he is feeling improved now, but still has some sensation of chest tightness.  History provided by patient. ROS:  -As included in HPI PE:   Vitals:   01/11/22 0237 01/11/22 0507  BP: 117/88 115/81  Pulse: 91 71  Resp: 17 18  Temp: 98 F (36.7 C) 98.9 F (37.2 C)  SpO2: 94% 98%    Non-toxic appearing No respiratory distress  MDM:   I've ordered labs and imaging in triage to expedite lab/diagnostic workup.  Patient was informed that the remainder of the evaluation will be completed by another provider, this initial triage assessment does not replace that evaluation, and the importance of remaining in the ED until their evaluation is complete.    Roxy Horseman, PA-C 01/11/22 2202

## 2022-01-11 NOTE — ED Notes (Signed)
Called in lobby and outside x 3 no answer

## 2022-05-10 DIAGNOSIS — Z419 Encounter for procedure for purposes other than remedying health state, unspecified: Secondary | ICD-10-CM | POA: Diagnosis not present

## 2022-06-10 DIAGNOSIS — Z419 Encounter for procedure for purposes other than remedying health state, unspecified: Secondary | ICD-10-CM | POA: Diagnosis not present

## 2022-07-11 DIAGNOSIS — Z419 Encounter for procedure for purposes other than remedying health state, unspecified: Secondary | ICD-10-CM | POA: Diagnosis not present

## 2022-07-23 ENCOUNTER — Telehealth: Payer: Self-pay

## 2022-07-23 NOTE — Telephone Encounter (Signed)
Mychart msg sent. AS, CMA

## 2022-07-25 IMAGING — CT CT ANGIO CHEST
2 of 6 series · 19 of 36 positions shown · IV contrast (omnipaque)
Comparison: Chest radiograph 12/09/2020

CLINICAL DATA: Left-sided chest pain, palpitations, shortness of
breath. Pulmonary embolus is suspected with high probability.

EXAM:
CT ANGIOGRAPHY CHEST WITH CONTRAST
TECHNIQUE: Multidetector CT imaging of the chest was performed using the
standard protocol during bolus administration of intravenous
contrast. Multiplanar CT image reconstructions and MIPs were
obtained to evaluate the vascular anatomy.
CONTRAST:  75mL OMNIPAQUE IOHEXOL 350 MG/ML SOLN

[Series 7: pe thins · axial · 0.82mm/px · z∈[-308,-15]mm · 18 of 466 slices shown]
[im 24/466  lung]
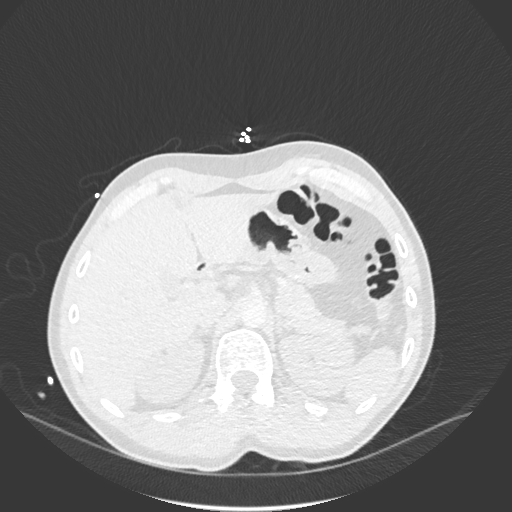
[im 47/466  mediastinal]
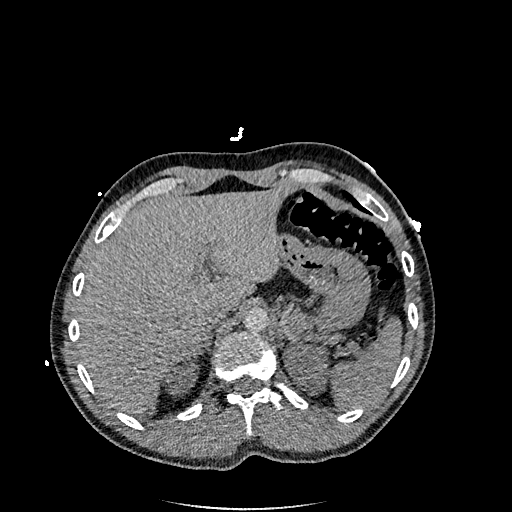
[im 70/466  lung]
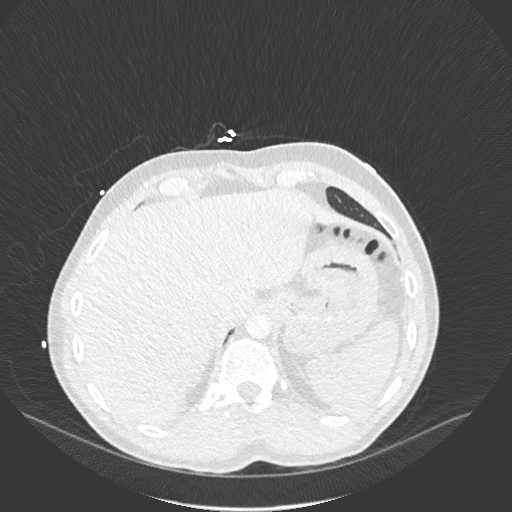
[im 94/466  mediastinal]
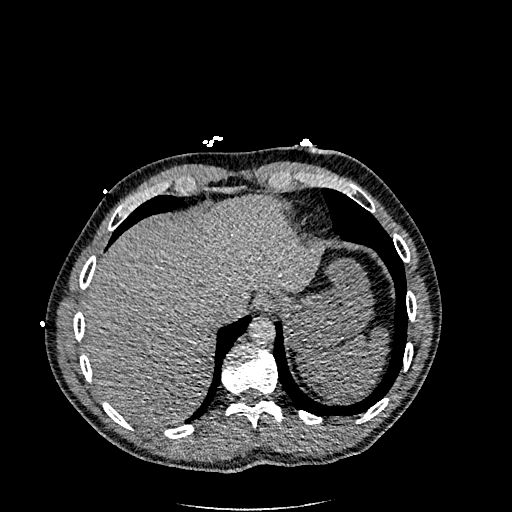
[im 117/466  lung]
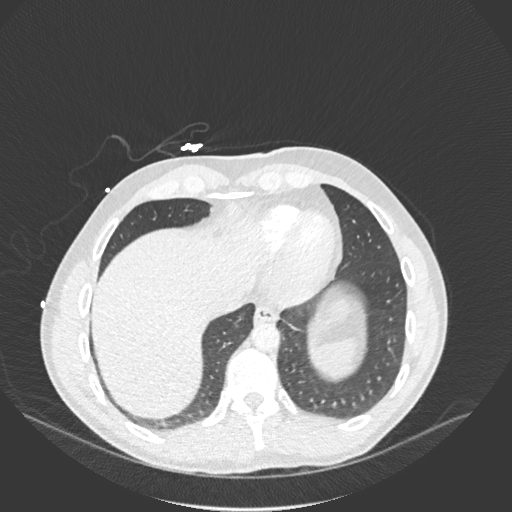
[im 140/466  mediastinal]
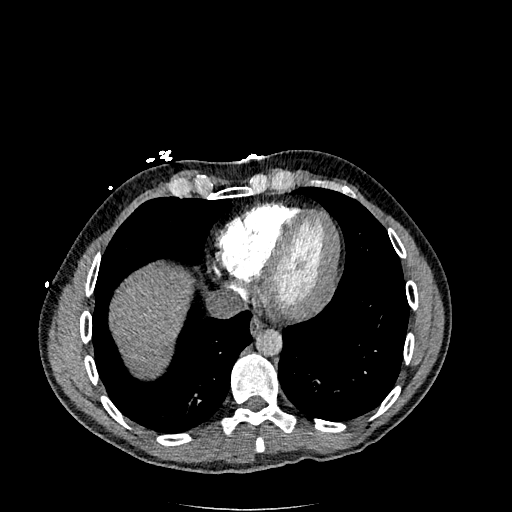
[im 163/466  lung]
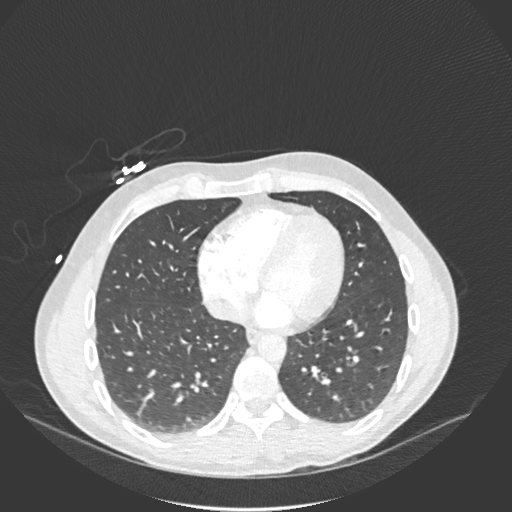
[im 187/466  mediastinal]
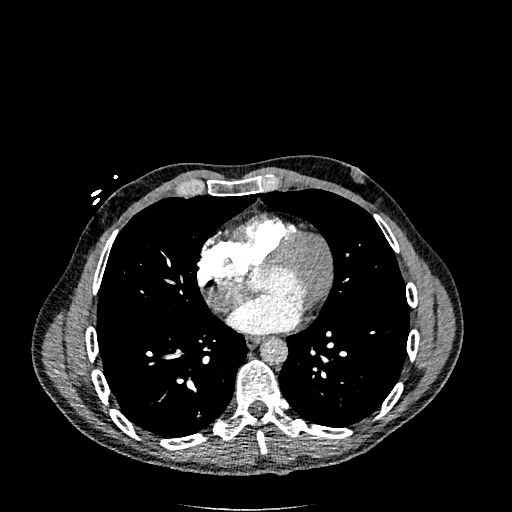
[im 210/466  lung]
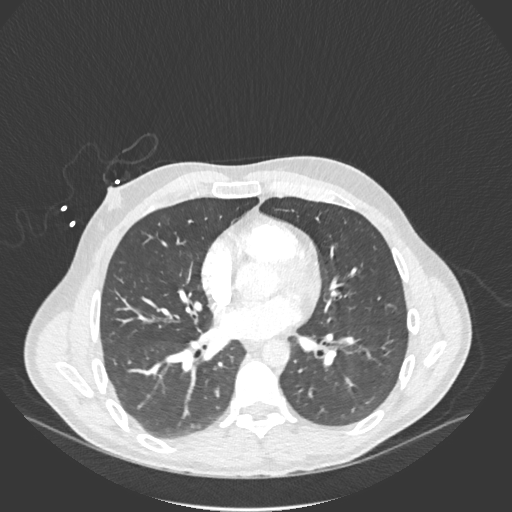
[im 256/466  mediastinal]
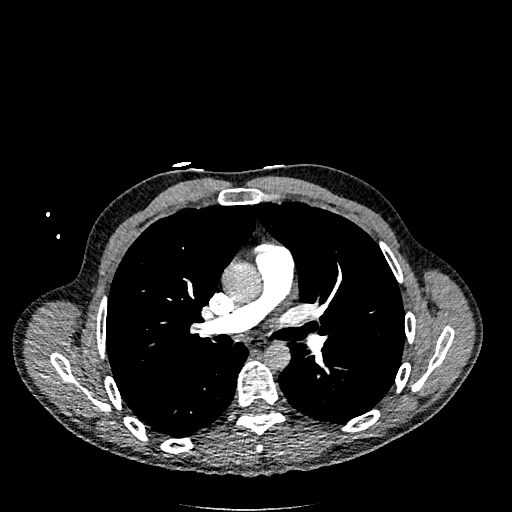
[im 280/466  lung]
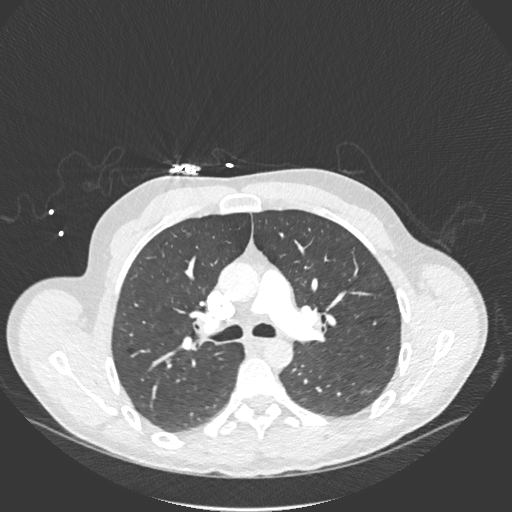
[im 303/466  mediastinal]
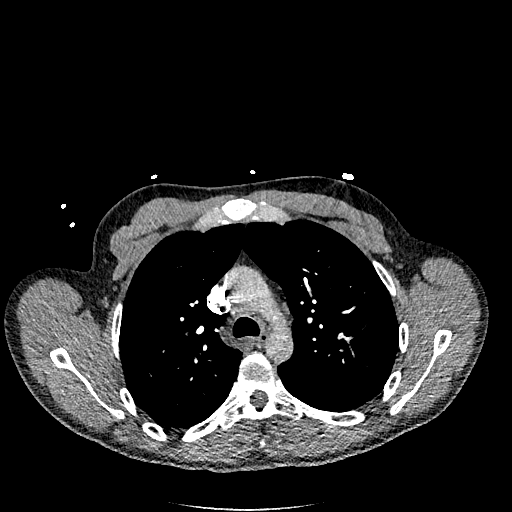
[im 326/466  lung]
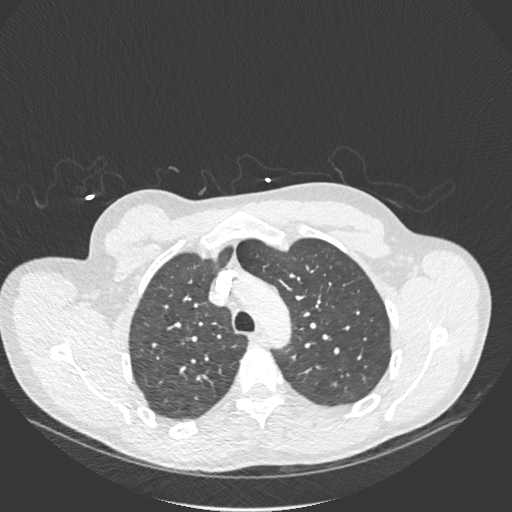
[im 349/466  mediastinal]
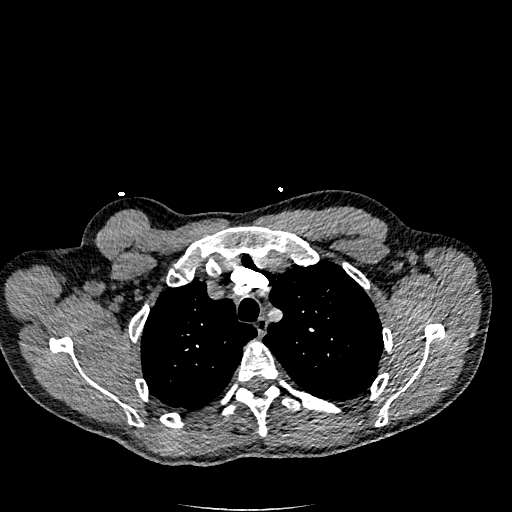
[im 373/466  lung]
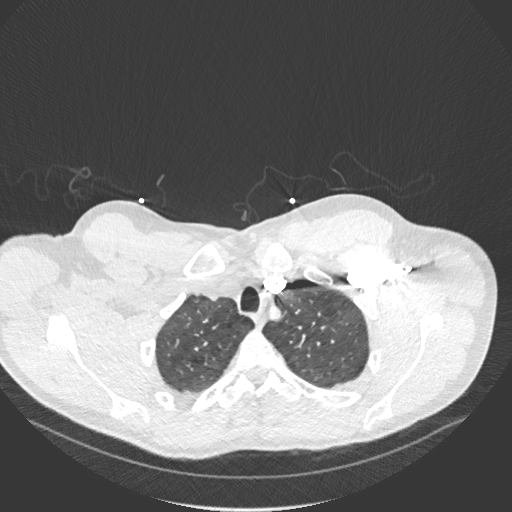
[im 396/466  mediastinal]
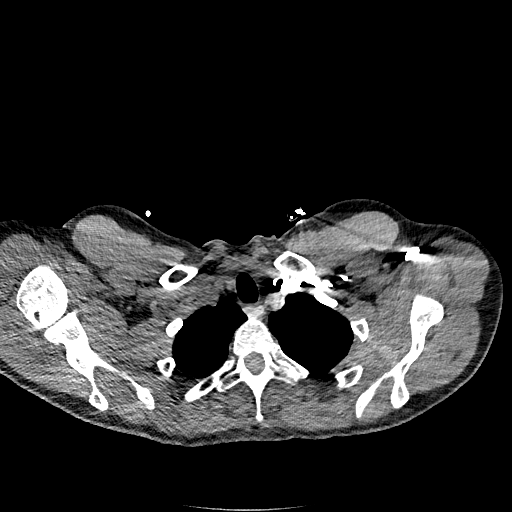
[im 419/466  lung]
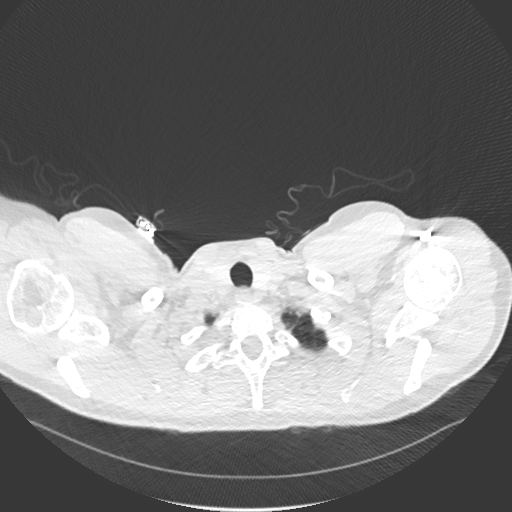
[im 442/466  mediastinal]
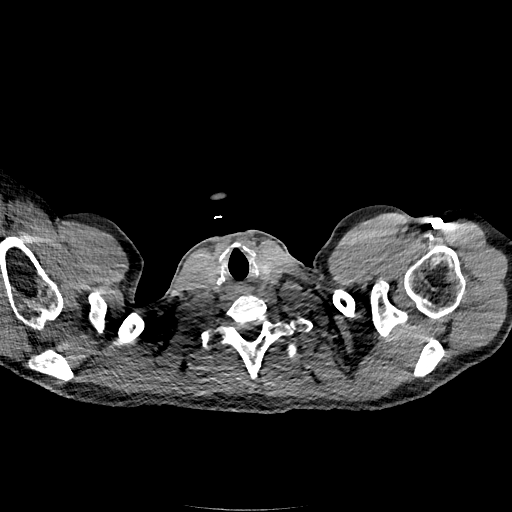

[Series 8: pe 2mm cor · coronal · 0.64mm/px · 1 of 151 slices shown]
[im 76/151  mediastinal]
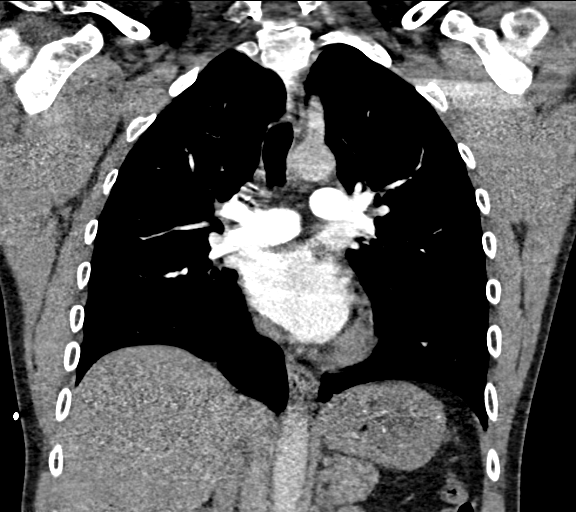

[19 of 36 positions shown; findings below may reference images not displayed]

FINDINGS: Cardiovascular: There is good opacification of the central and
segmental pulmonary arteries. No focal filling defects. No evidence
of significant pulmonary embolus. Normal heart size. No pericardial
effusions. Normal caliber thoracic aorta.

Mediastinum/Nodes: Thyroid gland is unremarkable. Esophagus is
decompressed. No significant lymphadenopathy in the chest.

Lungs/Pleura: Lungs are clear. No pleural effusions. No
pneumothorax.

Upper Abdomen: No acute abnormalities demonstrated in the visualized
upper abdomen.

Musculoskeletal: No chest wall abnormality. No acute or significant
osseous findings. Small circumscribed bone cysts suggested in the
right humeral head.

Review of the MIP images confirms the above findings.
IMPRESSION: 1. No evidence of significant pulmonary embolus.
2. Lungs are clear.

## 2022-08-09 DIAGNOSIS — Z419 Encounter for procedure for purposes other than remedying health state, unspecified: Secondary | ICD-10-CM | POA: Diagnosis not present

## 2022-09-09 DIAGNOSIS — Z419 Encounter for procedure for purposes other than remedying health state, unspecified: Secondary | ICD-10-CM | POA: Diagnosis not present

## 2022-10-09 DIAGNOSIS — Z419 Encounter for procedure for purposes other than remedying health state, unspecified: Secondary | ICD-10-CM | POA: Diagnosis not present

## 2022-10-25 IMAGING — DX DG CHEST 2V
2 series · 2 of 2 positions shown · non-contrast
Comparison: 01/06/2021.  CT, 12/10/2020.

CLINICAL DATA: Shortness of breath for 1 day.

EXAM:
CHEST - 2 VIEW

[chest pa]
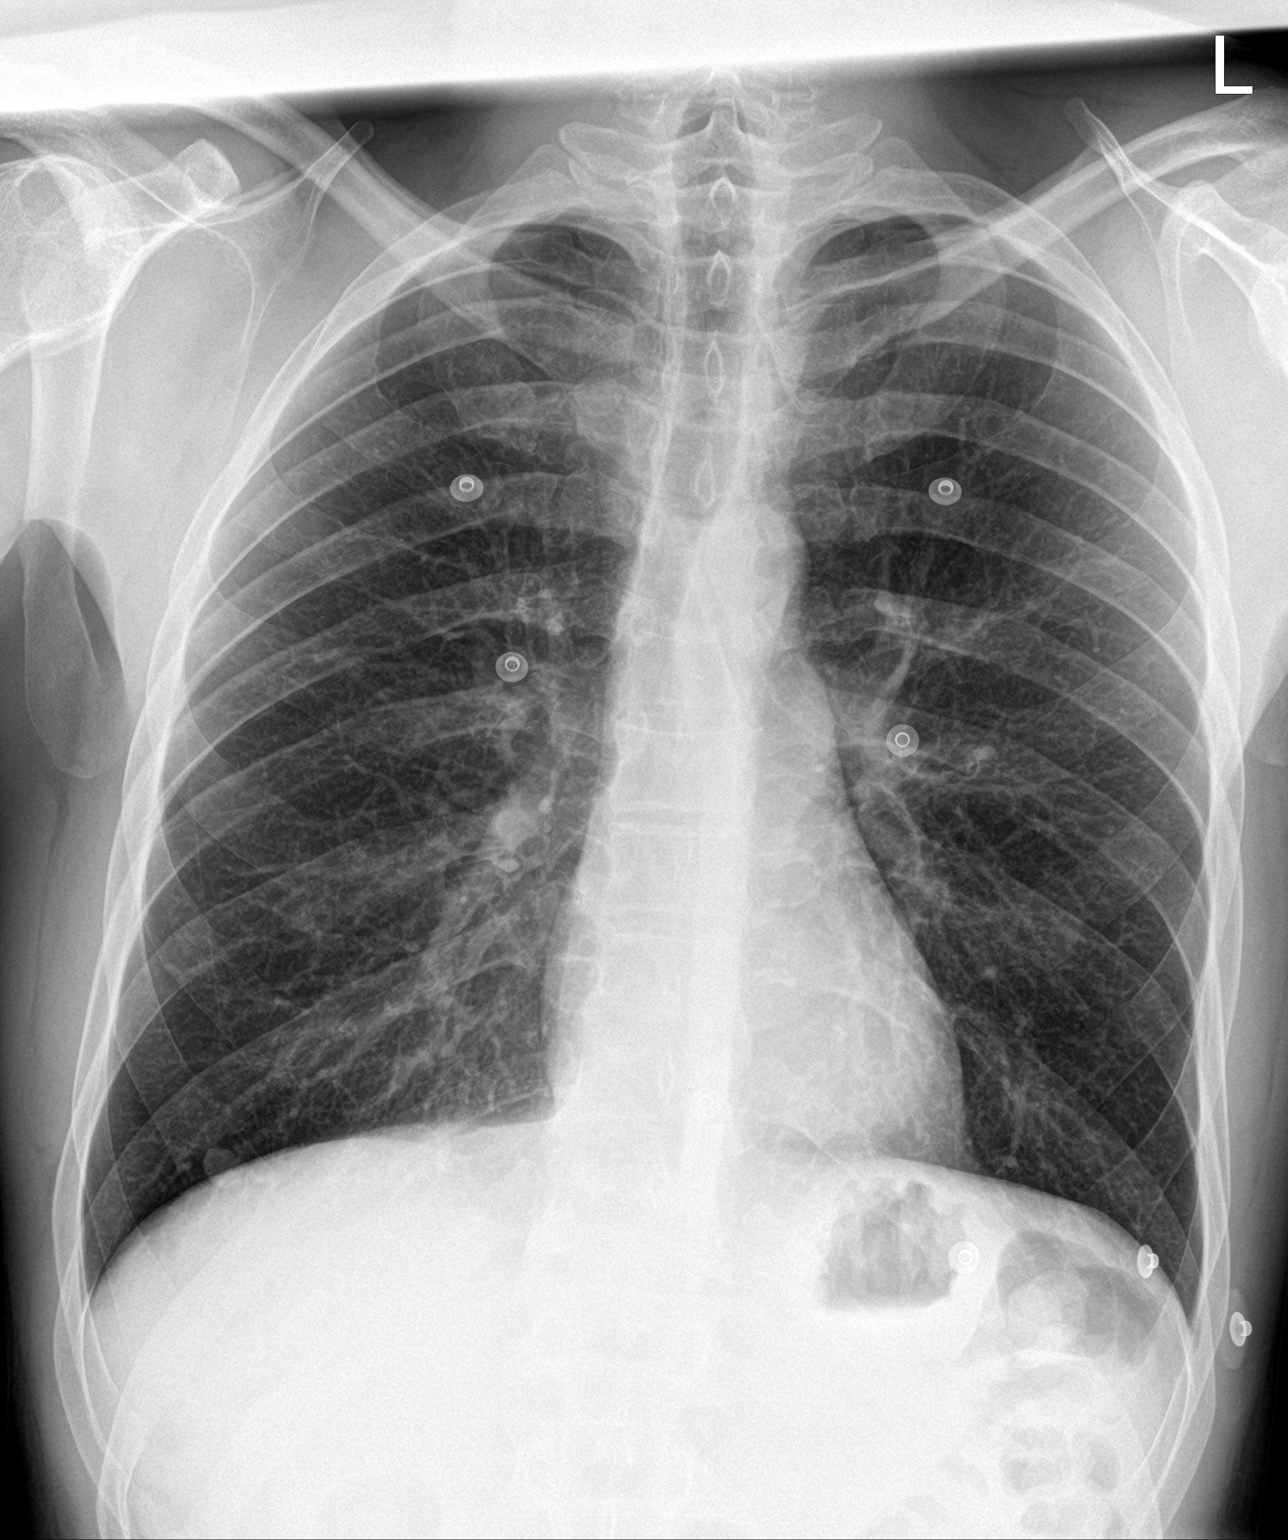

[chest lat]
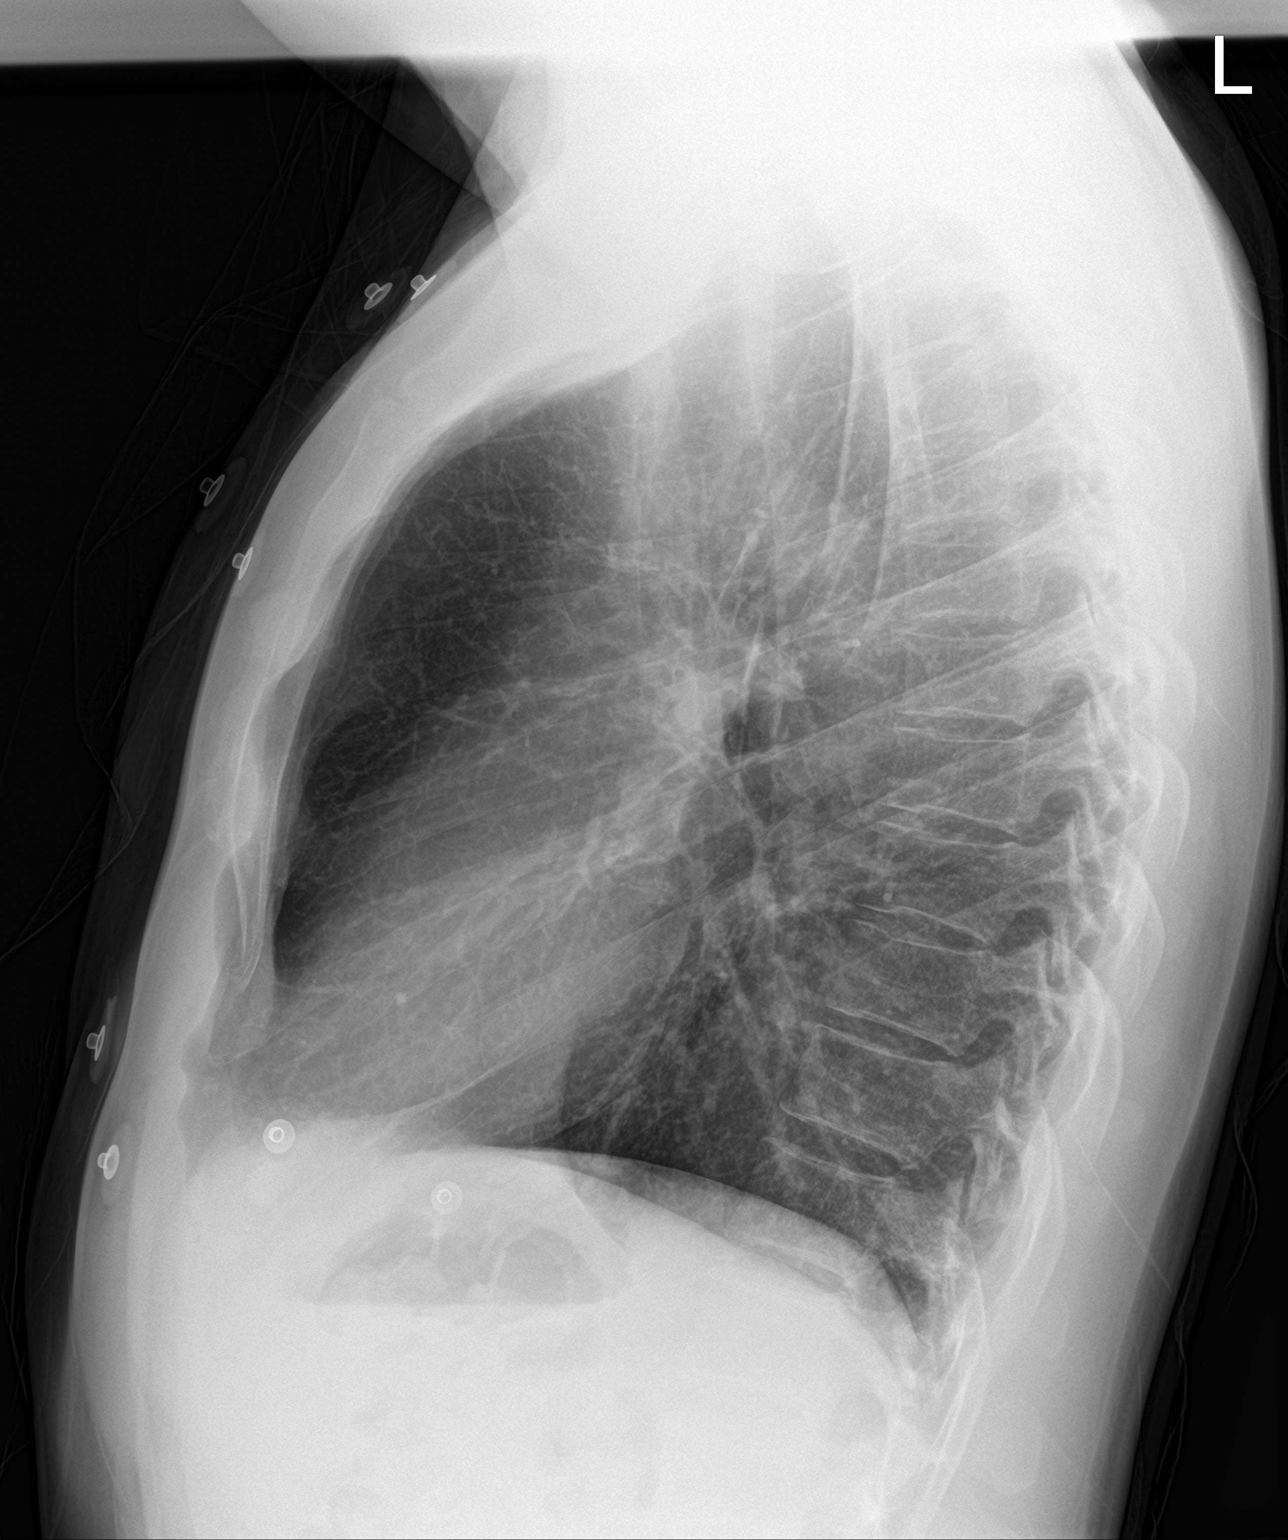

[2 of 2 positions shown; findings below may reference images not displayed]

FINDINGS: Cardiac silhouette is normal in size and configuration. Normal
mediastinal and hilar contours.

Lungs are hyperexpanded, but clear. No pleural effusion or
pneumothorax.

Skeletal structures are intact. Stable bone island in the anterior
right seventh rib.
IMPRESSION: No active cardiopulmonary disease.

## 2022-11-09 DIAGNOSIS — Z419 Encounter for procedure for purposes other than remedying health state, unspecified: Secondary | ICD-10-CM | POA: Diagnosis not present

## 2022-12-09 DIAGNOSIS — Z419 Encounter for procedure for purposes other than remedying health state, unspecified: Secondary | ICD-10-CM | POA: Diagnosis not present

## 2023-01-09 DIAGNOSIS — Z419 Encounter for procedure for purposes other than remedying health state, unspecified: Secondary | ICD-10-CM | POA: Diagnosis not present

## 2023-02-09 DIAGNOSIS — Z419 Encounter for procedure for purposes other than remedying health state, unspecified: Secondary | ICD-10-CM | POA: Diagnosis not present

## 2023-03-11 DIAGNOSIS — Z419 Encounter for procedure for purposes other than remedying health state, unspecified: Secondary | ICD-10-CM | POA: Diagnosis not present

## 2023-04-11 DIAGNOSIS — Z419 Encounter for procedure for purposes other than remedying health state, unspecified: Secondary | ICD-10-CM | POA: Diagnosis not present

## 2023-05-11 DIAGNOSIS — Z419 Encounter for procedure for purposes other than remedying health state, unspecified: Secondary | ICD-10-CM | POA: Diagnosis not present

## 2023-06-11 DIAGNOSIS — Z419 Encounter for procedure for purposes other than remedying health state, unspecified: Secondary | ICD-10-CM | POA: Diagnosis not present

## 2023-07-12 DIAGNOSIS — Z419 Encounter for procedure for purposes other than remedying health state, unspecified: Secondary | ICD-10-CM | POA: Diagnosis not present

## 2023-08-09 DIAGNOSIS — Z419 Encounter for procedure for purposes other than remedying health state, unspecified: Secondary | ICD-10-CM | POA: Diagnosis not present

## 2023-09-18 ENCOUNTER — Inpatient Hospital Stay (HOSPITAL_COMMUNITY)
Admission: AD | Admit: 2023-09-18 | Discharge: 2023-09-21 | DRG: 897 | Disposition: A | Discharge status: Home / Self Care | Source: Intra-hospital | Attending: Psychiatry | Admitting: Psychiatry

## 2023-09-18 ENCOUNTER — Ambulatory Visit (HOSPITAL_COMMUNITY)
Admission: EM | Admit: 2023-09-18 | Discharge: 2023-09-18 | Disposition: A | Discharge status: Other Acute Inpatient Hospital | Attending: Psychiatry | Admitting: Psychiatry

## 2023-09-18 ENCOUNTER — Other Ambulatory Visit: Payer: Self-pay

## 2023-09-18 ENCOUNTER — Encounter (HOSPITAL_COMMUNITY): Payer: Self-pay | Admitting: Behavioral Health

## 2023-09-18 DIAGNOSIS — F1110 Opioid abuse, uncomplicated: Principal | ICD-10-CM | POA: Diagnosis present

## 2023-09-18 DIAGNOSIS — G479 Sleep disorder, unspecified: Secondary | ICD-10-CM | POA: Diagnosis present

## 2023-09-18 DIAGNOSIS — F1420 Cocaine dependence, uncomplicated: Secondary | ICD-10-CM | POA: Diagnosis not present

## 2023-09-18 DIAGNOSIS — J449 Chronic obstructive pulmonary disease, unspecified: Secondary | ICD-10-CM | POA: Insufficient documentation

## 2023-09-18 DIAGNOSIS — F1020 Alcohol dependence, uncomplicated: Secondary | ICD-10-CM | POA: Diagnosis not present

## 2023-09-18 DIAGNOSIS — F32A Depression, unspecified: Secondary | ICD-10-CM | POA: Diagnosis not present

## 2023-09-18 DIAGNOSIS — Z653 Problems related to other legal circumstances: Secondary | ICD-10-CM | POA: Diagnosis not present

## 2023-09-18 DIAGNOSIS — F17200 Nicotine dependence, unspecified, uncomplicated: Secondary | ICD-10-CM | POA: Insufficient documentation

## 2023-09-18 DIAGNOSIS — F1120 Opioid dependence, uncomplicated: Secondary | ICD-10-CM | POA: Diagnosis not present

## 2023-09-18 DIAGNOSIS — F1910 Other psychoactive substance abuse, uncomplicated: Secondary | ICD-10-CM | POA: Diagnosis not present

## 2023-09-18 DIAGNOSIS — F1410 Cocaine abuse, uncomplicated: Secondary | ICD-10-CM | POA: Diagnosis not present

## 2023-09-18 DIAGNOSIS — F1010 Alcohol abuse, uncomplicated: Secondary | ICD-10-CM | POA: Diagnosis present

## 2023-09-18 DIAGNOSIS — Z79899 Other long term (current) drug therapy: Secondary | ICD-10-CM | POA: Diagnosis not present

## 2023-09-18 DIAGNOSIS — F17210 Nicotine dependence, cigarettes, uncomplicated: Secondary | ICD-10-CM | POA: Diagnosis present

## 2023-09-18 DIAGNOSIS — K219 Gastro-esophageal reflux disease without esophagitis: Secondary | ICD-10-CM | POA: Insufficient documentation

## 2023-09-18 DIAGNOSIS — F1990 Other psychoactive substance use, unspecified, uncomplicated: Secondary | ICD-10-CM

## 2023-09-18 DIAGNOSIS — F1190 Opioid use, unspecified, uncomplicated: Secondary | ICD-10-CM | POA: Insufficient documentation

## 2023-09-18 DIAGNOSIS — F1090 Alcohol use, unspecified, uncomplicated: Secondary | ICD-10-CM

## 2023-09-18 LAB — CBC WITH DIFFERENTIAL/PLATELET
Abs Immature Granulocytes: 0.02 10*3/uL (ref 0.00–0.07)
Basophils Absolute: 0 10*3/uL (ref 0.0–0.1)
Basophils Relative: 1 %
Eosinophils Absolute: 0.1 10*3/uL (ref 0.0–0.5)
Eosinophils Relative: 1 %
HCT: 42.5 % (ref 39.0–52.0)
Hemoglobin: 13.4 g/dL (ref 13.0–17.0)
Immature Granulocytes: 0 %
Lymphocytes Relative: 32 %
Lymphs Abs: 2.4 10*3/uL (ref 0.7–4.0)
MCH: 24.2 pg — ABNORMAL LOW (ref 26.0–34.0)
MCHC: 31.5 g/dL (ref 30.0–36.0)
MCV: 76.7 fL — ABNORMAL LOW (ref 80.0–100.0)
Monocytes Absolute: 0.6 10*3/uL (ref 0.1–1.0)
Monocytes Relative: 8 %
Neutro Abs: 4.3 10*3/uL (ref 1.7–7.7)
Neutrophils Relative %: 58 %
Platelets: 297 10*3/uL (ref 150–400)
RBC: 5.54 MIL/uL (ref 4.22–5.81)
RDW: 16.7 % — ABNORMAL HIGH (ref 11.5–15.5)
WBC: 7.4 10*3/uL (ref 4.0–10.5)
nRBC: 0 % (ref 0.0–0.2)

## 2023-09-18 LAB — HEMOGLOBIN A1C
Hgb A1c MFr Bld: 5.6 % (ref 4.8–5.6)
Mean Plasma Glucose: 114.02 mg/dL

## 2023-09-18 LAB — COMPREHENSIVE METABOLIC PANEL WITH GFR
ALT: 19 U/L (ref 0–44)
AST: 27 U/L (ref 15–41)
Albumin: 4.1 g/dL (ref 3.5–5.0)
Alkaline Phosphatase: 91 U/L (ref 38–126)
Anion gap: 12 (ref 5–15)
BUN: 5 mg/dL — ABNORMAL LOW (ref 6–20)
CO2: 23 mmol/L (ref 22–32)
Calcium: 9.4 mg/dL (ref 8.9–10.3)
Chloride: 103 mmol/L (ref 98–111)
Creatinine, Ser: 0.77 mg/dL (ref 0.61–1.24)
GFR, Estimated: >60 mL/min (ref >60)
Glucose, Bld: 78 mg/dL (ref 70–99)
Potassium: 4.3 mmol/L (ref 3.5–5.1)
Sodium: 138 mmol/L (ref 135–145)
Total Bilirubin: 0.5 mg/dL (ref 0.0–1.2)
Total Protein: 7.4 g/dL (ref 6.5–8.1)

## 2023-09-18 LAB — LIPID PANEL
Cholesterol: 156 mg/dL (ref 0–200)
HDL: 54 mg/dL (ref >40)
LDL Cholesterol: 71 mg/dL (ref 0–99)
Total CHOL/HDL Ratio: 2.9 ratio
Triglycerides: 154 mg/dL — ABNORMAL HIGH (ref <150)
VLDL: 31 mg/dL (ref 0–40)

## 2023-09-18 LAB — POCT URINE DRUG SCREEN - MANUAL ENTRY (I-SCREEN)
POC Amphetamine UR: NOT DETECTED
POC Buprenorphine (BUP): POSITIVE — AB
POC Cocaine UR: POSITIVE — AB
POC Marijuana UR: NOT DETECTED
POC Methadone UR: NOT DETECTED
POC Methamphetamine UR: NOT DETECTED
POC Morphine: NOT DETECTED
POC Oxazepam (BZO): NOT DETECTED
POC Oxycodone UR: NOT DETECTED
POC Secobarbital (BAR): NOT DETECTED

## 2023-09-18 LAB — TSH: TSH: 0.957 u[IU]/mL (ref 0.350–4.500)

## 2023-09-18 LAB — ETHANOL: Alcohol, Ethyl (B): 11 mg/dL — ABNORMAL HIGH (ref <10)

## 2023-09-18 MED ORDER — DIPHENHYDRAMINE HCL 50 MG PO CAPS: 50.0000 mg | ORAL_CAPSULE | Freq: Three times a day (TID) | ORAL | Status: DC | PRN

## 2023-09-18 MED ORDER — ALUM & MAG HYDROXIDE-SIMETH 200-200-20 MG/5ML PO SUSP: 30.0000 mL | ORAL | Status: DC | PRN

## 2023-09-18 MED ORDER — DIPHENHYDRAMINE HCL 25 MG PO CAPS: 50.0000 mg | ORAL_CAPSULE | Freq: Three times a day (TID) | ORAL | Status: DC | PRN

## 2023-09-18 MED ORDER — ONDANSETRON 4 MG PO TBDP: 4.0000 mg | ORAL_TABLET | Freq: Four times a day (QID) | ORAL | Status: AC | PRN

## 2023-09-18 MED ORDER — CHLORDIAZEPOXIDE HCL 25 MG PO CAPS: 25.0000 mg | ORAL_CAPSULE | Freq: Four times a day (QID) | ORAL | Status: DC | PRN

## 2023-09-18 MED ORDER — ADULT MULTIVITAMIN W/MINERALS CH: 1.0000 | ORAL_TABLET | Freq: Every day | ORAL | Status: DC

## 2023-09-18 MED ORDER — LORAZEPAM 2 MG/ML IJ SOLN: 2.0000 mg | Freq: Three times a day (TID) | INTRAMUSCULAR | Status: DC | PRN

## 2023-09-18 MED ORDER — MAGNESIUM HYDROXIDE 400 MG/5ML PO SUSP
30.0000 mL | Freq: Every day | ORAL | Status: DC | PRN
Start: 2023-09-18 — End: 2023-09-18

## 2023-09-18 MED ORDER — PANTOPRAZOLE SODIUM 40 MG PO TBEC
40.0000 mg | DELAYED_RELEASE_TABLET | Freq: Every day | ORAL | Status: DC
  Administered 2023-09-19 – 2023-09-21 (×3): 40 mg via ORAL
  Filled 2023-09-18 (×6): qty 1

## 2023-09-18 MED ORDER — HALOPERIDOL LACTATE 5 MG/ML IJ SOLN: 5.0000 mg | Freq: Three times a day (TID) | INTRAMUSCULAR | Status: DC | PRN

## 2023-09-18 MED ORDER — TRAZODONE HCL 50 MG PO TABS: 50.0000 mg | ORAL_TABLET | Freq: Every evening | ORAL | Status: DC | PRN

## 2023-09-18 MED ORDER — PANTOPRAZOLE SODIUM 40 MG PO TBEC
40.0000 mg | DELAYED_RELEASE_TABLET | Freq: Every day | ORAL | Status: DC
  Administered 2023-09-18: 40 mg via ORAL
  Filled 2023-09-18: qty 1

## 2023-09-18 MED ORDER — THIAMINE HCL 100 MG/ML IJ SOLN: 100.0000 mg | Freq: Once | INTRAMUSCULAR | Status: DC

## 2023-09-18 MED ORDER — TRAZODONE HCL 50 MG PO TABS
50.0000 mg | ORAL_TABLET | Freq: Every evening | ORAL | Status: DC | PRN
  Administered 2023-09-19 – 2023-09-20 (×2): 50 mg via ORAL
  Filled 2023-09-18 (×2): qty 1

## 2023-09-18 MED ORDER — HALOPERIDOL 5 MG PO TABS
5.0000 mg | ORAL_TABLET | Freq: Three times a day (TID) | ORAL | Status: DC | PRN
Start: 2023-09-18 — End: 2023-09-18

## 2023-09-18 MED ORDER — ADULT MULTIVITAMIN W/MINERALS CH
1.0000 | ORAL_TABLET | Freq: Every day | ORAL | Status: DC
  Administered 2023-09-19 – 2023-09-21 (×3): 1 via ORAL
  Filled 2023-09-18 (×6): qty 1

## 2023-09-18 MED ORDER — HYDROXYZINE HCL 25 MG PO TABS: 25.0000 mg | ORAL_TABLET | Freq: Three times a day (TID) | ORAL | Status: DC | PRN

## 2023-09-18 MED ORDER — ALBUTEROL SULFATE HFA 108 (90 BASE) MCG/ACT IN AERS: 1.0000 | INHALATION_SPRAY | Freq: Four times a day (QID) | RESPIRATORY_TRACT | Status: DC | PRN

## 2023-09-18 MED ORDER — ALUM & MAG HYDROXIDE-SIMETH 200-200-20 MG/5ML PO SUSP
30.0000 mL | ORAL | Status: DC | PRN
Start: 2023-09-18 — End: 2023-09-18

## 2023-09-18 MED ORDER — ACETAMINOPHEN 325 MG PO TABS: 650.0000 mg | ORAL_TABLET | Freq: Four times a day (QID) | ORAL | Status: DC | PRN

## 2023-09-18 MED ORDER — HALOPERIDOL 5 MG PO TABS: 5.0000 mg | ORAL_TABLET | Freq: Three times a day (TID) | ORAL | Status: DC | PRN

## 2023-09-18 MED ORDER — BUPRENORPHINE HCL-NALOXONE HCL 8-2 MG SL SUBL
1.0000 | SUBLINGUAL_TABLET | Freq: Three times a day (TID) | SUBLINGUAL | Status: DC
  Administered 2023-09-18: 1 via SUBLINGUAL
  Filled 2023-09-18: qty 1

## 2023-09-18 MED ORDER — DIPHENHYDRAMINE HCL 50 MG/ML IJ SOLN: 50.0000 mg | Freq: Three times a day (TID) | INTRAMUSCULAR | Status: DC | PRN

## 2023-09-18 MED ORDER — BUPRENORPHINE HCL-NALOXONE HCL 8-2 MG SL SUBL
1.0000 | SUBLINGUAL_TABLET | Freq: Three times a day (TID) | SUBLINGUAL | Status: DC
  Administered 2023-09-18 – 2023-09-19 (×2): 1 via SUBLINGUAL
  Filled 2023-09-18: qty 1

## 2023-09-18 MED ORDER — LOPERAMIDE HCL 2 MG PO CAPS: 2.0000 mg | ORAL_CAPSULE | ORAL | Status: AC | PRN

## 2023-09-18 MED ORDER — ONDANSETRON 4 MG PO TBDP: 4.0000 mg | ORAL_TABLET | Freq: Four times a day (QID) | ORAL | Status: DC | PRN

## 2023-09-18 MED ORDER — HYDROXYZINE HCL 25 MG PO TABS: 25.0000 mg | ORAL_TABLET | Freq: Four times a day (QID) | ORAL | Status: DC | PRN

## 2023-09-18 MED ORDER — HALOPERIDOL LACTATE 5 MG/ML IJ SOLN: 10.0000 mg | Freq: Three times a day (TID) | INTRAMUSCULAR | Status: DC | PRN

## 2023-09-18 MED ORDER — HALOPERIDOL LACTATE 5 MG/ML IJ SOLN
10.0000 mg | Freq: Three times a day (TID) | INTRAMUSCULAR | Status: DC | PRN
Start: 2023-09-18 — End: 2023-09-18

## 2023-09-18 MED ORDER — LOPERAMIDE HCL 2 MG PO CAPS: 2.0000 mg | ORAL_CAPSULE | ORAL | Status: DC | PRN

## 2023-09-18 MED ORDER — MAGNESIUM HYDROXIDE 400 MG/5ML PO SUSP: 30.0000 mL | Freq: Every day | ORAL | Status: DC | PRN

## 2023-09-18 MED ORDER — HYDROXYZINE HCL 25 MG PO TABS
25.0000 mg | ORAL_TABLET | Freq: Four times a day (QID) | ORAL | Status: AC | PRN
  Administered 2023-09-20: 25 mg via ORAL
  Filled 2023-09-18: qty 1

## 2023-09-18 NOTE — Plan of Care (Signed)
  Problem: Education: Goal: Knowledge of [place] [facility] Education information/materials will improve Outcome: Progressing Goal: Emotional status will improve Outcome: Progressing Goal: Mental status will improve Outcome: Progressing Goal: Verbalization of understanding the information provided will improve Outcome: Progressing   Problem: Activity: Goal: Interest or engagement in activities will improve Outcome: Not Progressing

## 2023-09-18 NOTE — BH Assessment (Signed)
 Comprehensive Clinical Assessment (CCA) Note   09/18/2023 Robert Eaton [id number]  Disposition: Vincente Poli recommends [facility].  The patient demonstrates the following risk factors for suicide: Chronic risk factors for suicide include: substance use disorder. Acute risk factors for suicide include: unemployment. Protective factors for this patient include:  support needed  . Considering these factors, the overall suicide risk at this point appears to be low. Patient is not appropriate for outpatient follow up.   Pt presents to [facility] voluntarily, accompanied by sheriff department requesting substance abuse treatment. Pt reports using cocaine (unknown amount) this evening and a few beers. Pt reports that he was using ocassionally, but more recently he has desired to use more often. Pt also reports taking Suboxone and started with [facility] program in January. Pt currently denies SI,HI,AVH.   On evaluation, patient is alert, oriented x 3, and cooperative. Speech is clear, coherent and logical. Pt appears casual. Eye contact is fair. Mood is anxious and depressed, affect is congruent with mood. Thought process is logical and thought content is coherent. Pt denies SI/ HI/AVH. There is no indication that the patient is responding to internal stimuli. No delusions elicited during this assessment.     Chief Complaint:  Chief Complaint  Patient presents with   Addiction Problem   Visit Diagnosis:  Cocaine Abuse     CCA Screening, Triage and Referral (STR)  Patient Reported Information How did you hear about [place]? Legal System  What Is the Reason for Your Visit/Call Today? Pt presents to [facility] voluntarily, accompanied by sheriff department requesting substance abuse treatment. Pt reports using cocaine (unknown amount) this evening and a few beers. Pt reports that he was using ocassionally, but more recently he has desired to use more often. Pt also reports taking Suboxone and  started with [facility] program in January. Pt currently denies SI,HI,AVH.  How Long Has This Been Causing You Problems? <Week  What Do You Feel Would Help You the Most Today? Alcohol or Drug Use Treatment   Have You Recently Had Any Thoughts About Hurting Yourself? No  Are You Planning to Commit Suicide/Harm Yourself At This time? No   Flowsheet Row ED from 09/18/2023 in [facility] ED from 01/11/2022 in [facility] Emergency Department at [facility] ED from 07/13/2021 in [facility] Emergency Department at [facility]  C-SSRS RISK CATEGORY No Risk No Risk No Risk       Have you Recently Had Thoughts About Hurting Someone Karolee Ohs? No  Are You Planning to Harm Someone at This Time? No  Explanation: Denies HI   Have You Used Any Alcohol or Drugs in the Past 24 Hours? Yes  How Long Ago Did You Use Drugs or Alcohol? Yesterday What Did You Use and How Much? Cocaine (unknown amount)   Do You Currently Have a Therapist/Psychiatrist? No  Name of Therapist/Psychiatrist:  n/a  Have You Been Recently Discharged From Any Office Practice or Programs? No  Explanation of Discharge From Practice/Program: n/a   CCA Screening Triage Referral Assessment Type of Contact: Face-to-Face  Telemedicine Service Delivery:   Is this Initial or Reassessment?   Date Telepsych consult ordered in [facility]:    Time Telepsych consult ordered in [facility]:    Location of Assessment: [facility] Assessment Services  Provider Location: [facility] Assessment Services   Collateral Involvement: None   Does Patient Have a Automotive engineer Guardian? No  Legal Guardian Contact Information: n/a  Copy of Legal Guardianship  Form: -- (n/a)  Legal Guardian Notified of Arrival: -- (n/a)  Legal Guardian Notified of Pending Discharge: -- (n/a)  If Minor and Not Living with Parent(s), Who has Custody? n/a  Is CPS involved or ever been involved? Never  Is APS involved or ever been  involved? Never   Patient Determined To Be At Risk for Harm To Self or Others Based on Review of Patient Reported Information or Presenting Complaint? No  Method: No Plan  Availability of Means: No access or NA  Intent: Vague intent or NA  Notification Required: No need or identified person  Additional Information for Danger to Others Potential: -- (n/a)  Additional Comments for Danger to Others Potential: n/a  Are There Guns or Other Weapons in Your Home? No  Types of Guns/Weapons: Deniess access to [employer] Secured?                            Yes  Who Could Verify You Are Able To Have These Secured: Denies access  Do You Have any Outstanding Charges, Pending Court Dates, Parole/Probation? Denies pending legal charges  Contacted To Inform of Risk of Harm To Self or Others: -- (n/a)    Does Patient Present under Involuntary Commitment? No    [place] of Residence: [place]   Patient Currently Receiving the Following Services: Not Receiving Services   Determination of Need: Urgent (48 hours)   Options For Referral: Other: Comment; Chemical Dependency Intensive Outpatient Therapy (CDIOP)     CCA Biopsychosocial Patient Reported Schizophrenia/Schizoaffective Diagnosis in Past: No   Strengths: Willingness to seek treatment   Mental Health Symptoms Depression:  None   Duration of Depressive symptoms:    Mania:  None   Anxiety:   None   Psychosis:  None   Duration of Psychotic symptoms:    Trauma:  None   Obsessions:  None   Compulsions:  None   Inattention:  None   Hyperactivity/Impulsivity:  None   Oppositional/Defiant Behaviors:  None   Emotional Irregularity:  None   Other Mood/Personality Symptoms:  None reported    Mental Status Exam Appearance and self-care  Stature:  Average   Weight:  Average weight   Clothing:  Casual   Grooming:  Normal   Cosmetic use:  None   Posture/gait:  Normal    Motor activity:  Not Remarkable   Sensorium  Attention:  Normal   Concentration:  Normal   Orientation:  X5   Recall/memory:  Normal   Affect and Mood  Affect:  Appropriate   Mood:  Euthymic   Relating  Eye contact:  Normal   Facial expression:  Responsive   Attitude toward examiner:  Cooperative   Thought and Language  Speech flow: Clear and Coherent   Thought content:  Appropriate to Mood and Circumstances   Preoccupation:  None   Hallucinations:  None   Organization:  Coherent   [employer] of Knowledge:  Average   Intelligence:  Average   Abstraction:  Normal   Judgement:  Impaired   Reality Testing:  Adequate   Insight:  Fair   Decision Making:  Impulsive   Social Functioning  Social Maturity:  Impulsive   Social Judgement:  "Street Smart"   Stress  Stressors:  Transitions   Coping Ability:  Exhausted; Overwhelmed   Skill Deficits:  Decision making   Supports:  Friends/Service system     Religion: Religion/Spirituality  Are You A Religious Person?: No How Might This Affect Treatment?: n/a  Leisure/Recreation: Leisure / Recreation Do You Have Hobbies?: No  Exercise/Diet: Exercise/Diet Do You Exercise?: No Have You Gained or Lost A Significant Amount of Weight in the Past Six Months?: No Do You Follow a Special Diet?: No Do You Have Any Trouble Sleeping?: No   CCA Employment/Education Employment/Work Situation: Employment / Work Situation Employment Situation: Unemployed Patient's Job has Been Impacted by Current Illness: No Has Patient ever Been in Equities trader?: No  Education: Education Is Patient Currently Attending School?: No Last Grade Completed: 12 Did You Product manager?: No Did You Have An Individualized Education Program (IIEP): No Did You Have Any Difficulty At [employer]?: No Patient's Education Has Been Impacted by Current Illness: No   CCA Family/Childhood History Family and Relationship  History: Family history Marital status: Single Does patient have children?: No  Childhood History:  Childhood History By whom was/is the patient raised?: Father Did patient suffer any verbal/emotional/physical/sexual abuse as a child?: No Did patient suffer from severe childhood neglect?: No Has patient ever been sexually abused/assaulted/raped as an adolescent or adult?: No Was the patient ever a victim of a crime or a disaster?: No Witnessed domestic violence?: No Has patient been affected by domestic violence as an adult?: No       CCA Substance Use Alcohol/Drug Use: Alcohol / Drug Use Pain Medications: See MAR Prescriptions: See MAR Over the Counter: See MAR History of alcohol / drug use?: Yes Longest period of sobriety (when/how long): unknown Negative Consequences of Use: Personal relationships Withdrawal Symptoms: None Substance #1 Name of Substance 1: ETOH 1 - Age of First Use: unknown 1 - Amount (size/oz): 2 - 40 oz beers 1 - Frequency: daily 1 - Last Use / Amount: "before I came" Substance #2 Name of Substance 2: Cocaine 2 - Age of First Use: 35 2 - Amount (size/oz): one grame 2 - Frequency: 2x per week 2 - Last Use / Amount: yesterday                     ASAM's:  Six Dimensions of Multidimensional Assessment  Dimension 1:  Acute Intoxication and/or Withdrawal Potential:      Dimension 2:  Biomedical Conditions and Complications:      Dimension 3:  Emotional, Behavioral, or Cognitive Conditions and Complications:     Dimension 4:  Readiness to Change:     Dimension 5:  Relapse, Continued use, or Continued Problem Potential:     Dimension 6:  Recovery/Living Environment:     ASAM Severity Score:    ASAM Recommended Level of Treatment: ASAM Recommended Level of Treatment: Level II Partial Hospitalization Treatment   Substance use Disorder (SUD) Substance Use Disorder (SUD)  Checklist Symptoms of Substance Use: Continued use despite having a  persistent/recurrent physical/psychological problem caused/exacerbated by use, Continued use despite persistent or recurrent social, interpersonal problems, caused or exacerbated by use  Recommendations for Services/Supports/Treatments: Recommendations for Services/Supports/Treatments Recommendations For Services/Supports/Treatments: Facility Based Crisis, Partial Hospitalization, CD-IOP Intensive Chemical Dependency Program  Disposition Recommendation per psychiatric provider: Facility Based Crisis Center   DSM5 Diagnoses: There are no active problems to display for this patient.    Referrals to Alternative Service(s): Referred to Alternative Service(s):   Place:   Date:   Time:    Referred to Alternative Service(s):   Place:   Date:   Time:    Referred to Alternative Service(s):   Place:   Date:  Time:    Referred to Alternative Service(s):   Place:   Date:   Time:     Dava Najjar, [place], [facility], NCC

## 2023-09-18 NOTE — Discharge Instructions (Signed)
Patient accepted to [facility] today.

## 2023-09-18 NOTE — BHH Group Notes (Signed)
 BHH Group Notes:  (Nursing/MHT/Case Management/Adjunct)  Date:  09/18/2023  Time:  9:14 PM  Type of Therapy:   Wrap-up group  Participation Level:  Did Not Attend  Participation Quality:    Affect:    Cognitive:    Insight:    Engagement in Group:    Modes of Intervention:    Summary of Progress/Problems: Refused to attend group.  Noah Delaine 09/18/2023, 9:14 PM
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
## 2023-09-18 NOTE — Progress Notes (Signed)
 Pt has been accepted to [facility] on 09/18/2023 Bed assignment: 304-2  Pt meets inpatient criteria per: Liborio Nixon NP  Attending Physician will be: Dr.Zouev.   Report can be called to:Adult unit: [phone]  Pt can arrive after VOL CONSENT DC   Care Team Notified: [facility] Malva Limes RN, Liborio Nixon RN, Jenean Lindau RN, Nelva Bush RN, Feliberto Harts RN, Devinny Harrington NT   [place] Camari Wisham LCSW-A   09/18/2023 10:53 AM

## 2023-09-18 NOTE — Progress Notes (Signed)
   09/18/23 0306  [facility] Triage Screening (Walk-ins at [facility] only)  How Did You Hear About [place]? Legal System  What Is the Reason for Your Visit/Call Today? Pt presents to [facility] voluntarily, accompanied by sheriff department requesting substance abuse treatment. Pt reports using cocaine (unknown amount) this evening and a few beers. Pt reports that he was using ocassionally, but more recently he has desired to use more often. Pt also reports taking Suboxone and started with [facility] program in January. Pt currently denies SI,HI,AVH.  How Long Has This Been Causing You Problems? <Week  Have You Recently Had Any Thoughts About Hurting Yourself? No  Are You Planning to Commit Suicide/Harm Yourself At This time? No  Have you Recently Had Thoughts About Hurting Someone Karolee Ohs? No  Are You Planning To Harm Someone At This Time? No  Physical Abuse Denies  Verbal Abuse Denies  Sexual Abuse Denies  Exploitation of patient/patient's resources Denies  Self-Neglect Denies  Are you currently experiencing any auditory, visual or other hallucinations? No  Have You Used Any Alcohol or Drugs in the Past 24 Hours? Yes  What Did You Use and How Much? Cocaine (unknown amount)  Do you have any current medical co-morbidities that require immediate attention? No  Clinician description of patient physical appearance/behavior: cooperative, calm, casually dressed  What Do You Feel Would Help You the Most Today? Alcohol or Drug Use Treatment  If access to [facility] was not available, would you have sought care in the Emergency Department? No  Determination of Need Urgent (48 hours)  Options For Referral Other: Comment;Chemical Dependency Intensive Outpatient Therapy (CDIOP)  Determination of Need filed? Yes

## 2023-09-18 NOTE — Plan of Care (Signed)

## 2023-09-18 NOTE — Tx Team (Signed)
 Initial Treatment Plan 09/18/2023 6:00 PM Robert Eaton YNW:[id number]    PATIENT STRESSORS: Financial difficulties   Legal issue   Substance abuse     PATIENT STRENGTHS: Communication skills  General fund of knowledge  Supportive family/friends    PATIENT IDENTIFIED PROBLEMS: Substance use.  Cocaine   Substance use Etoh                   DISCHARGE CRITERIA:  Ability to meet basic life and health needs Motivation to continue treatment in a less acute level of care Need for constant or close observation no longer present Verbal commitment to aftercare and medication compliance Withdrawal symptoms are absent or subacute and managed without 24-hour nursing intervention  PRELIMINARY DISCHARGE PLAN: Attend aftercare/continuing care group Attend 12-step recovery group Return to previous living arrangement  PATIENT/FAMILY INVOLVEMENT: This treatment plan has been presented to and reviewed with the patient, Robert Eaton, The patient and family have been given the opportunity to ask questions and make suggestions.  Lillie Columbia, RN 09/18/2023, 6:00 PM

## 2023-09-18 NOTE — ED Notes (Signed)
 Stable. A&O x 4.  Transferring to [facility] safe transport.      Denies current SI plan and Intent.  Denies HI and A/V hallucinations.   All belongings returned to PT.

## 2023-09-18 NOTE — ED Provider Notes (Signed)
 [facility] Continuous Assessment Admission H&P  Date: 09/18/23 Patient Name: Robert Eaton MRN: [id number] Chief Complaint: " I've been using cocaine and alcohol".  Diagnoses:  Final diagnoses:  Polysubstance abuse (HCC)  Cocaine use disorder, severe, dependence (HCC)  Alcohol use disorder, severe, dependence (HCC)  Substance use disorder    HPI: Robert Eaton is a 38 year old male with no psychiatric history who presented voluntarily in to [facility] via [employer], and requesting substance abuse treatment for alcohol and cocaine.  Patient was seen face to face by this provider and chart reviewed.  On evaluation, patient is alert, oriented x 3, and cooperative. Speech is clear, normal rate and coherent. Pt appears fairly groomed. Eye contact is minimal. Mood is depressed, affect is congruent with mood. Thought process is coherent.goal directed and thought content is coherent. Pt denies SI/HI/AVH. There is no indication that the patient is responding to internal stimuli. No delusions elicited during this assessment.    Pt reports "I called the police because I need help, I've been using cocaine and alcohol, I need 72 hours to be a way from everything, just a couple of hours". Patient reports he began using cocaine a few years ago, denies daily use.,  Last used a few hours ago before ED presentation. Patient endorses daily alcohol use since late 2020/early 2021, and states "I drink about 3 or more 12 ounce beer daily, last drank tonight"..  Patient denies other illicit substance use.  Patient denies a history of substance detox treatment but states he went to a drug class sometime in [place].  Patient reports he taking Suboxone daily and started with [facility] program in January.  Patient is not established with outpatient psychiatric services for medication management or therapy.  Patient completed the PHQ-9 questionnaire and obtained a total score of 7, indicating mild  depression   Discussed recommendation for admission to [facility] for substance abuse treatment/detox. Received milieu and expectations. Patient will be admitted to the [facility] continuous observation unit for safety monitoring/treatment pending availability of bed and [facility] Recommend CIWA protocol  Patient is provided with opportunity for questions.  He verbalizes his understanding and is in agreement.  Total Time spent with patient: 30 minutes  Musculoskeletal  Strength & Muscle Tone: within normal limits Gait & Station: normal Patient leans: N/A  Psychiatric Specialty Exam  Presentation General Appearance:  Fairly Groomed  Eye Contact: Minimal  Speech: Clear and Coherent  Speech Volume: Normal  Handedness: Right   Mood and Affect  Mood: Depressed  Affect: Depressed; Flat   Thought Process  Thought Processes: Coherent; Goal Directed  Descriptions of Associations:Intact  Orientation:Full (Time, Place and Person)  Thought Content:WDL    Hallucinations:Hallucinations: None  Ideas of Reference:None  Suicidal Thoughts:Suicidal Thoughts: No  Homicidal Thoughts:Homicidal Thoughts: No   Sensorium  Memory: Immediate Fair  Judgment: Poor  Insight: Shallow   Executive Functions  Concentration: Fair  Attention Span: Fair  Recall: Fair  Fund of Knowledge: Fair  Language: Fair   Psychomotor Activity  Psychomotor Activity: Psychomotor Activity: Normal   Assets  Assets: Communication Skills; Desire for Improvement   Sleep  Sleep: Sleep: Fair   Nutritional Assessment (For OBS and [facility]BC admissions only) Has the patient had a weight loss or gain of 10 pounds or more in the last 3 months?: No Has the patient had a decrease in food intake/or appetite?: No Does the patient have dental problems?: No Does the patient have eating habits or behaviors that may be indicators  of an eating disorder including binging or inducing vomiting?: No Has the  patient recently lost weight without trying?: 0 Has the patient been eating poorly because of a decreased appetite?: 0 Malnutrition Screening Tool Score: 0    Physical Exam Constitutional:      General: He is not in acute distress.    Appearance: He is not diaphoretic.  HENT:     Head: Normocephalic.     Nose: No congestion.  Pulmonary:     Effort: No respiratory distress.  Chest:     Chest wall: No tenderness.  Neurological:     Mental Status: He is alert and oriented to person, place, and time.  Psychiatric:        Attention and Perception: Attention and perception normal.        Mood and Affect: Mood is depressed. Affect is flat.        Speech: Speech normal.        Behavior: Behavior is cooperative.        Thought Content: Thought content normal.    Review of Systems  Constitutional:  Negative for chills, fever and malaise/fatigue.  HENT:  Negative for congestion.   Eyes:  Negative for discharge.  Respiratory:  Negative for cough, shortness of breath and wheezing.   Cardiovascular:  Negative for chest pain and palpitations.  Gastrointestinal:  Negative for diarrhea, nausea and vomiting.  Neurological:  Negative for dizziness, weakness and headaches.  Psychiatric/Behavioral:  Positive for depression and substance abuse.     Blood pressure 125/82, pulse 84, temperature 98.9 F (37.2 C), temperature source Oral, resp. rate 20, SpO2 98%. There is no height or weight on file to calculate BMI.  Past Psychiatric History: See H & P   Is the patient at risk to self? Yes  Has the patient been a risk to self in the past 6 months? Yes .    Has the patient been a risk to self within the distant past? Yes   Is the patient a risk to others? No   Has the patient been a risk to others in the past 6 months? No   Has the patient been a risk to others within the distant past? No   Past Medical History: See Chart  Family History: N/A  Social History: N.A  Last Labs:  Admission  on 09/18/2023  Component Date Value Ref Range Status   POC Amphetamine UR 09/18/2023 None Detected  NONE DETECTED (Cut Off Level 1000 ng/mL) Final   POC Secobarbital (BAR) 09/18/2023 None Detected  NONE DETECTED (Cut Off Level 300 ng/mL) Final   POC Buprenorphine (BUP) 09/18/2023 Positive (A)  NONE DETECTED (Cut Off Level 10 ng/mL) Final   POC Oxazepam (BZO) 09/18/2023 None Detected  NONE DETECTED (Cut Off Level 300 ng/mL) Final   POC Cocaine UR 09/18/2023 Positive (A)  NONE DETECTED (Cut Off Level 300 ng/mL) Final   POC Methamphetamine UR 09/18/2023 None Detected  NONE DETECTED (Cut Off Level 1000 ng/mL) Final   POC Morphine 09/18/2023 None Detected  NONE DETECTED (Cut Off Level 300 ng/mL) Final   POC Methadone UR 09/18/2023 None Detected  NONE DETECTED (Cut Off Level 300 ng/mL) Final   POC Oxycodone UR 09/18/2023 None Detected  NONE DETECTED (Cut Off Level 100 ng/mL) Final   POC Marijuana UR 09/18/2023 None Detected  NONE DETECTED (Cut Off Level 50 ng/mL) Final    Allergies: Patient has no known allergies.  Medications:  Facility Ordered Medications  Medication   acetaminophen (  TYLENOL) tablet 650 mg   alum & mag hydroxide-simeth (MAALOX/MYLANTA) 200-200-20 MG/5ML suspension 30 mL   magnesium hydroxide (MILK OF MAGNESIA) suspension 30 mL   haloperidol (HALDOL) tablet 5 mg   And   diphenhydrAMINE (BENADRYL) capsule 50 mg   haloperidol lactate (HALDOL) injection 5 mg   And   diphenhydrAMINE (BENADRYL) injection 50 mg   And   LORazepam (ATIVAN) injection 2 mg   haloperidol lactate (HALDOL) injection 10 mg   And   diphenhydrAMINE (BENADRYL) injection 50 mg   And   LORazepam (ATIVAN) injection 2 mg   traZODone (DESYREL) tablet 50 mg   thiamine (VITAMIN B1) injection 100 mg   multivitamin with minerals tablet 1 tablet   chlordiazePOXIDE (LIBRIUM) capsule 25 mg   hydrOXYzine (ATARAX) tablet 25 mg   loperamide (IMODIUM) capsule 2-4 mg   ondansetron (ZOFRAN-ODT) disintegrating  tablet 4 mg   PTA Medications  Medication Sig   cephALEXin (KEFLEX) 500 MG capsule Take 1 capsule (500 mg total) by mouth 4 (four) times daily.   ibuprofen (ADVIL) 200 MG tablet Take 1,200-1,400 mg by mouth every 8 (eight) hours as needed for mild pain.   albuterol (VENTOLIN HFA) 108 (90 Base) MCG/ACT inhaler Inhale 1-2 puffs into the lungs every 6 (six) hours as needed for wheezing or shortness of breath.   omeprazole (PRILOSEC) 20 MG capsule Take 1 capsule (20 mg total) by mouth daily.      Medical Decision Making  Recommend admission to the [facility] for substance abuse treatment/detox when bed available.   Lab Orders         CBC with Differential/Platelet         Comprehensive metabolic panel         Hemoglobin A1c         Ethanol         Lipid panel         TSH         POCT Urine Drug Screen - (I-Screen)     EKG  Recommend CIWA Protocol Initiate CIWA protocol -lorazepam 1 mg every 6 hours prn for CIWA >10 -thiamine 100 mg daily for nutritional supplementation -hydroxyzine 25 mg every 6 hours prn for anxiety, CIWA < or = 10 -ondansetron 4 mg ODT every 6 hours prn nausea/vomiting -loperamide 2-4 mg capsule prn diarrhea or loose stools   Other Prns -Trazodone 50 mg p.o. nightly as needed insomnia -MOM 30 mL p.o. daily as needed constipation -Maalox 30 mL p.o. every 4 hours, as needed indigestion -Tylenol 650 mg p.o. every 6 hours as needed  As needed agitation protocol medications  Recommendations  Based on my evaluation the patient does not appear to have an emergency medical condition.  Recommend admission to the [facility] for substance abuse treatment/detox when bed available.   Mancel Bale, NP 09/18/23  4:34 AM

## 2023-09-18 NOTE — ED Notes (Signed)
 Patient A&Ox4.  Patient present with wanted help substance abuse. Patient denies SI /HI and AVH. Patient oriented to unit. Meal and beverage given. Patient denies any physical complaints when asked. No acute distress noted. Support and encouragement provided. Routine safety checks conducted according to facility protocol. Encouraged patient to notify staff if thoughts of harm toward self or others arise. Patient verbalize understanding and agreement. Will continue to monitor for safety.

## 2023-09-18 NOTE — Progress Notes (Signed)
   09/18/23 2100  Psych Admission Type (Psych Patients Only)  Admission Status Voluntary  Psychosocial Assessment  Patient Complaints Substance abuse;Other (Comment) ("Tired")  Eye Contact Brief  Facial Expression Flat  Affect Appropriate to circumstance  Speech Logical/coherent  Interaction Assertive  Motor Activity Slow  Appearance/Hygiene Unremarkable  Behavior Characteristics Calm  Mood Sullen  Thought Process  Coherency WDL  Content WDL  Delusions None reported or observed  Perception WDL  Hallucination None reported or observed  Judgment WDL  Confusion None  Danger to Self  Current suicidal ideation? Denies  Danger to Others  Danger to Others None reported or observed

## 2023-09-18 NOTE — Progress Notes (Signed)
 Admission note for 1630  Pt is a 34 y M admitted from the [facility] for Cocaine and ETOH abuse.  Pt denies SI, HI or AVH at time of admission.  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  States that he drinks apx 3 beers per day and "lately I have been doing cocaine". Pt is also on suboxone prescribed. Pt reports that he wants to " only be here for 72 hours. Pt signed the 72 hour discharge form at 1630 on 09/18/23 (Dr. Woodroe Mode notified).  Pt was searched with no contraband found on his person.  His skin assessment was performed and belongings locked in locker 25 (including home suboxone medication).  Pt was pleasant and appropriate during admission process. He was oriented to milieu and room and given hygiene supplies. Q106min checks started for safety.

## 2023-09-18 NOTE — ED Notes (Signed)
 Patient has remained asleep in unit without distress or complaint.  He ate lunch and received meds.  Patient has bed pending at [facility].

## 2023-09-19 ENCOUNTER — Encounter (HOSPITAL_COMMUNITY): Payer: Self-pay

## 2023-09-19 DIAGNOSIS — K219 Gastro-esophageal reflux disease without esophagitis: Secondary | ICD-10-CM | POA: Insufficient documentation

## 2023-09-19 DIAGNOSIS — J449 Chronic obstructive pulmonary disease, unspecified: Secondary | ICD-10-CM | POA: Insufficient documentation

## 2023-09-19 DIAGNOSIS — F1910 Other psychoactive substance abuse, uncomplicated: Secondary | ICD-10-CM | POA: Diagnosis not present

## 2023-09-19 DIAGNOSIS — F1410 Cocaine abuse, uncomplicated: Secondary | ICD-10-CM | POA: Insufficient documentation

## 2023-09-19 DIAGNOSIS — F1190 Opioid use, unspecified, uncomplicated: Secondary | ICD-10-CM | POA: Insufficient documentation

## 2023-09-19 DIAGNOSIS — F17200 Nicotine dependence, unspecified, uncomplicated: Secondary | ICD-10-CM | POA: Insufficient documentation

## 2023-09-19 DIAGNOSIS — F1090 Alcohol use, unspecified, uncomplicated: Secondary | ICD-10-CM

## 2023-09-19 MED ORDER — BUPRENORPHINE HCL-NALOXONE HCL 8-2 MG SL SUBL
1.0000 | SUBLINGUAL_TABLET | Freq: Two times a day (BID) | SUBLINGUAL | Status: DC
  Administered 2023-09-19 – 2023-09-21 (×4): 1 via SUBLINGUAL
  Filled 2023-09-19 (×5): qty 1

## 2023-09-19 MED ORDER — NICOTINE POLACRILEX 2 MG MT GUM
2.0000 mg | CHEWING_GUM | OROMUCOSAL | Status: DC | PRN
  Administered 2023-09-19 – 2023-09-21 (×8): 2 mg via ORAL
  Filled 2023-09-19 (×6): qty 1

## 2023-09-19 MED ORDER — NICOTINE 14 MG/24HR TD PT24
14.0000 mg | MEDICATED_PATCH | Freq: Every day | TRANSDERMAL | Status: DC
  Administered 2023-09-19 – 2023-09-21 (×3): 14 mg via TRANSDERMAL
  Filled 2023-09-19 (×7): qty 1

## 2023-09-19 NOTE — H&P (Signed)
 Psychiatric Admission Assessment Adult  Patient Identification: Robert Eaton MRN:  [id number] Date of Evaluation:  09/19/2023 Chief Complaint:  Polysubstance abuse (HCC) [F19.10] Principal Diagnosis: Polysubstance abuse (HCC) Diagnosis:  Principal Problem:   Polysubstance abuse (HCC) Active Problems:   COPD (chronic obstructive pulmonary disease) (HCC)   Alcohol use disorder   Cocaine abuse (HCC)   Opioid use disorder   GERD (gastroesophageal reflux disease)   Tobacco use disorder  History of Present Illness: Robert Eaton, 38 year old male [facility], 2023, seeking treatment for alcohol and cocaine use.  He has a documented psychiatric history of opioid use disorder and reported taking Suboxone daily.  A PHQ-9 screening was completed, yielding a total score of 7, consistent with mild depression.  The patient was subsequently admitted to the [facility] later that day for substance use treatment and medication management.  The nursing staff reported that the patient signed a seventy-two-hour request upon admission to the [facility], indicating his intention to stay for only seventy-two hours. This form was signed on September 18, 2023, at 16:30.  On today's evaluation, the patient reported he self-initiated a call to the police, expressing a need for assistance and a desire to be away from his current environment for seventy-two hours. He disclosed a history of substance use, reporting the initiation of cocaine use in February 2021 following the passing of his mother. He reported that he currently consumes cocaine three to four times per week and last used it a few hours prior to presenting to the emergency department. Additionally, he reported alcohol use, consuming three to four 12-ounce beers daily, also initiated in February 2021. He denied the use of other illicit substances and confirmed a history of  substance detox treatment. [name] reported he is currently prescribed Suboxone, which he takes daily. A review of the PDMP corroborated this information, with the last prescription filled on September 17, 2023, for a fourteen-day supply.   The patient stated that he does not have an outpatient psychiatric provider and has never engaged in therapy. Patient reported he is currently participating in the [facility] Stop MAT program for opioid abuse, which he began in January 2025, attending sessions every second Wednesday. He expressed a desire for treatment for his alcohol and cocaine use but declined interest in additional medication at this time. He denied a history of delirium tremens, withdrawal seizures, or withdrawal symptoms. He also denied any past suicide attempts, self-harming behaviors, or access to firearms. He reported no psychiatric diagnoses and denied symptoms of mania, PTSD, psychosis, anxiety, or depression, though he mentioned occasional disturbed sleep attributed to his work schedule. He denied a history of trauma and reported no family history of mental health issues, completed suicides, or substance abuse.  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 Patient reported he has a tenth-grade education, is unmarried, and has three adult children. He reported smoking one and a half packs of cigarettes per day and denied any religious affiliation. Patient reported he enjoys watching movies and identifies as straight and male. He denied any military background or history of violence. He reported legal issues, including being on parole with six months remaining of a nine-month sentence for obtaining property by false pretenses. Patient reported he was recently released from prison in January 2025.  The patient reported his medical history  includes COPD, for which he uses an  albuterol inhaler as needed, and a history of acid reflux and opioid abuse. He reported an injury in 2015 that led to opioid use.   The patient denies having any suicidal thoughts. Denies having any suicidal intent and plan. Denies having any HI. Denies having psychotic symptoms. Denies issues with appetite.   The case was reviewed with the attending psychiatrist, V. Izediuno, The patient is currently on Suboxone, which is managed through the [facility] Stop MAT program. The plan includes maintaining the current Suboxone regimen.  Associated Signs/Symptoms: Depression Symptoms:   Denies  (Hypo) Manic Symptoms:   Denies  Anxiety Symptoms:   Denies  Psychotic Symptoms:   Denies  PTSD Symptoms: Denies NA Total Time spent with patient: 1 hour  Past Psychiatric History: Polysubstance abuse, Alcohol use  - Denies any past psychiatric diagnoses. - Denies any history of psychiatric medications. - Denies any history of psychotherapy or psychiatric hospitalizations. - Reports participation in the [facility] Stop MAT program since January for opioid abuse. - Reports a history of substance detox treatment. - Denies any history of self-harm or suicidal attempts. - Denies any history of trauma. - Denies symptoms of mania, PTSD, psychosis, anxiety, or depression, except occasional disturbed sleep attributed to work schedule.  Current Medication:  - Suboxone daily. - Albuterol inhaler as needed.  Substance Use  - Tobacco: Smokes a pack and a half of cigarettes per day. - Alcohol: Consumes three to four 12-ounce beers daily, started in February 2021 after his mother passed. - Other Drugs: Uses cocaine three to four times per week, started in February 2021 after his mother passed. Denies any other illicit substance use.   Social History The patient resides in [place] with his father and oldest brother. He has never been married and has three adult children. His highest level of education is tenth grade. 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 His support system includes his father and brother. He does not affiliate with any religion and identifies his hobbies as watching movies. He is straight and identifies as male. There is no military background or history of violence, but he is currently on parole, having been released from prison in January after serving time for obtaining property by false pretenses. He has six months remaining on his nine-month parole. The patient smokes a pack and a half of cigarettes per day.   Is the patient at risk to self? No.  Has the patient been a risk to self in the past 6 months? No.  Has the patient been a risk to self within the distant past? No.  Is the patient a risk to others? No.  Has the patient been a risk to others in the past 6 months? No.  Has the patient been a risk to others within the distant past? No.   [place] Scale:  Flowsheet Row [facility] Most recent reading at 09/18/2023  4:00 PM ED from 09/18/2023 in [facility] Most recent reading at 09/18/2023  4:26 AM ED from 01/11/2022 in [facility] Most recent reading at 01/11/2022  2:35 AM  C-SSRS RISK CATEGORY No Risk No Risk No Risk        Prior Inpatient Therapy: No. Denies   Prior Outpatient Therapy: No. Denies    Alcohol Screening: 1. How often do you have a drink containing alcohol?: 4 or  more times a week 2. How many drinks containing alcohol do you have on a typical day when you are drinking?: 3 or 4 3. How often do you have six or more drinks on one occasion?: Less than monthly AUDIT-C Score: 6 4. How often during the last year have you found that you were not able to stop drinking once you had started?: Less than monthly 5. How often during the last year have you failed to do what was normally expected from you because of drinking?:  Never 6. How often during the last year have you needed a first drink in the morning to get yourself going after a heavy drinking session?: Never 7. How often during the last year have you had a feeling of guilt of remorse after drinking?: Never (0) 8. How often during the last year have you been unable to remember what happened the night before because you had been drinking?: Never 9. Have you or someone else been injured as a result of your drinking?: No 10. Has a relative or friend or a doctor or another health worker been concerned about your drinking or suggested you cut down?: No Alcohol Use Disorder Identification Test Final Score (AUDIT): 7 Alcohol Brief Interventions/Follow-up: Alcohol education/Brief advice Substance Abuse History in the last 12 months:  Yes.   Consequences of Substance Abuse: Legal Consequences:  As listed above  Previous Psychotropic Medications: No  Psychological Evaluations: No  Past Medical History:  Past Medical History:  Diagnosis Date   COPD (chronic obstructive pulmonary disease) (HCC)    Irregular heartbeat    History reviewed. No pertinent surgical history. Family History: History reviewed. No pertinent family history. Family Psychiatric  History: Denies  Tobacco Screening:  Social History   Tobacco Use  Smoking Status Every Day   Current packs/day: 1.50   Types: Cigarettes  Smokeless Tobacco Never    BH Tobacco Counseling     Are you interested in Tobacco Cessation Medications?  No value filed. Counseled patient on smoking cessation:  No value filed. Reason Tobacco Screening Not Completed: No value filed.       Social History:  Social History   Substance and Sexual Activity  Alcohol Use Yes   Comment: several beers per day     Social History   Substance and Sexual Activity  Drug Use Yes   Types: Marijuana    Additional Social History:                           Allergies:  No Known Allergies Lab Results:   Results for orders placed or performed during the hospital encounter of 09/18/23 (from the past 48 hours)  POCT Urine Drug Screen - (I-Screen)     Status: Abnormal   Collection Time: 09/18/23  4:09 AM  Result Value Ref Range   POC Amphetamine UR None Detected NONE DETECTED (Cut Off Level 1000 ng/mL)   POC Secobarbital (BAR) None Detected NONE DETECTED (Cut Off Level 300 ng/mL)   POC Buprenorphine (BUP) Positive (A) NONE DETECTED (Cut Off Level 10 ng/mL)   POC Oxazepam (BZO) None Detected NONE DETECTED (Cut Off Level 300 ng/mL)   POC Cocaine UR Positive (A) NONE DETECTED (Cut Off Level 300 ng/mL)   POC Methamphetamine UR None Detected NONE DETECTED (Cut Off Level 1000 ng/mL)   POC Morphine None Detected NONE DETECTED (Cut Off Level 300 ng/mL)   POC Methadone UR None Detected NONE DETECTED (Cut Off Level 300  ng/mL)   POC Oxycodone UR None Detected NONE DETECTED (Cut Off Level 100 ng/mL)   POC Marijuana UR None Detected NONE DETECTED (Cut Off Level 50 ng/mL)  CBC with Differential/Platelet     Status: Abnormal   Collection Time: 09/18/23  6:00 AM  Result Value Ref Range   WBC 7.4 4.0 - 10.5 K/uL   RBC 5.54 4.22 - 5.81 MIL/uL   Hemoglobin 13.4 13.0 - 17.0 g/dL   HCT 40.9 81.1 - 91.4 %   MCV 76.7 (L) 80.0 - 100.0 fL   MCH 24.2 (L) 26.0 - 34.0 pg   MCHC 31.5 30.0 - 36.0 g/dL   RDW 78.2 (H) 95.6 - 21.3 %   Platelets 297 150 - 400 K/uL   nRBC 0.0 0.0 - 0.2 %   Neutrophils Relative % 58 %   Neutro Abs 4.3 1.7 - 7.7 K/uL   Lymphocytes Relative 32 %   Lymphs Abs 2.4 0.7 - 4.0 K/uL   Monocytes Relative 8 %   Monocytes Absolute 0.6 0.1 - 1.0 K/uL   Eosinophils Relative 1 %   Eosinophils Absolute 0.1 0.0 - 0.5 K/uL   Basophils Relative 1 %   Basophils Absolute 0.0 0.0 - 0.1 K/uL   Immature Granulocytes 0 %   Abs Immature Granulocytes 0.02 0.00 - 0.07 K/uL    Comment: Performed at [facility], [address] [place], [place] [address]  Comprehensive metabolic panel     Status:  Abnormal   Collection Time: 09/18/23  6:00 AM  Result Value Ref Range   Sodium 138 135 - 145 mmol/L   Potassium 4.3 3.5 - 5.1 mmol/L   Chloride 103 98 - 111 mmol/L   CO2 23 22 - 32 mmol/L   Glucose, Bld 78 70 - 99 mg/dL    Comment: Glucose reference range applies only to samples taken after fasting for at least 8 hours.   BUN 5 (L) 6 - 20 mg/dL   Creatinine, Ser 8.46 0.61 - 1.24 mg/dL   Calcium 9.4 8.9 - 96.2 mg/dL   Total Protein 7.4 6.5 - 8.1 g/dL   Albumin 4.1 3.5 - 5.0 g/dL   AST 27 15 - 41 U/L   ALT 19 0 - 44 U/L   Alkaline Phosphatase 91 38 - 126 U/L   Total Bilirubin 0.5 0.0 - 1.2 mg/dL   GFR, Estimated >95 >28 mL/min    Comment: (NOTE) Calculated using the CKD-EPI Creatinine Equation (2021)    Anion gap 12 5 - 15    Comment: Performed at [facility], [address] [place], [place] [address]  Hemoglobin A1c     Status: None   Collection Time: 09/18/23  6:01 AM  Result Value Ref Range   Hgb A1c MFr Bld 5.6 4.8 - 5.6 %    Comment: (NOTE) Pre diabetes:          5.7%-6.4%  Diabetes:              >6.4%  Glycemic control for   <7.0% adults with diabetes    Mean Plasma Glucose 114.02 mg/dL    Comment: Performed at [facility], [address], [place], [place] [address]  Ethanol     Status: Abnormal   Collection Time: 09/18/23  6:02 AM  Result Value Ref Range   Alcohol, Ethyl (B) 11 (H) <10 mg/dL    Comment: (NOTE) Lowest detectable limit for serum alcohol is 10 mg/dL.  For medical purposes only.  Performed at [facility], [address] [place], [place] [address]   Lipid panel     Status: Abnormal   Collection Time: 09/18/23  6:02 AM  Result Value Ref Range   Cholesterol 156 0 - 200 mg/dL   Triglycerides 604 (H) <150 mg/dL   HDL 54 >54 mg/dL   Total CHOL/HDL Ratio 2.9 RATIO   VLDL 31 0 - 40 mg/dL   LDL Cholesterol 71 0 - 99 mg/dL    Comment:        Total Cholesterol/HDL:CHD Risk Coronary Heart Disease Risk Table                      Men   Women  1/2 Average Risk   3.4   3.3  Average Risk       5.0   4.4  2 X Average Risk   9.6   7.1  3 X Average Risk  23.4   11.0        Use the calculated Patient Ratio above and the CHD Risk Table to determine the patient's CHD Risk.        ATP III CLASSIFICATION (LDL):  <100     mg/dL   Optimal  098-119  mg/dL   Near or Above                    Optimal  130-159  mg/dL   Borderline  147-829  mg/dL   High  >562     mg/dL   Very High Performed at [facility], [address] [place], [place] [address]   TSH     Status: None   Collection Time: 09/18/23  6:02 AM  Result Value Ref Range   TSH 0.957 0.350 - 4.500 uIU/mL    Comment: Performed by a 3rd Generation assay with a functional sensitivity of <=0.01 uIU/mL. Performed at [facility], [address], [place], [place] [address]     Blood Alcohol level:  Lab Results  Component Value Date   ETH 11 (H) 09/18/2023   ETH <10 12/09/2020    Metabolic Disorder Labs:  Lab Results  Component Value Date   HGBA1C 5.6 09/18/2023   MPG 114.02 09/18/2023   No results found for: "PROLACTIN" Lab Results  Component Value Date   CHOL 156 09/18/2023   TRIG 154 (H) 09/18/2023   HDL 54 09/18/2023   CHOLHDL 2.9 09/18/2023   VLDL 31 09/18/2023   LDLCALC 71 09/18/2023    Current Medications: Current Facility-Administered Medications  Medication Dose Route Frequency Provider Last Rate Last Admin   acetaminophen (TYLENOL) tablet 650 mg  650 mg Oral Q6H PRN White, Patrice L, NP       albuterol (VENTOLIN HFA) 108 (90 Base) MCG/ACT inhaler 1-2 puff  1-2 puff Inhalation Q6H PRN White, Patrice L, NP       alum & mag hydroxide-simeth (MAALOX/MYLANTA) 200-200-20 MG/5ML suspension 30 mL  30 mL Oral Q4H PRN White, Patrice L, NP       buprenorphine-naloxone (SUBOXONE) 8-2 mg per SL tablet 1 tablet  1 tablet Sublingual BID Izediuno, Vincent A, MD   1 tablet at 09/19/23 1655   haloperidol (HALDOL) tablet 5 mg  5 mg Oral TID PRN  White, Patrice L, NP       And   diphenhydrAMINE (BENADRYL) capsule 50 mg  50 mg Oral TID PRN White, Patrice L, NP       haloperidol  lactate (HALDOL) injection 5 mg  5 mg Intramuscular TID PRN White, Patrice L, NP       And   diphenhydrAMINE (BENADRYL) injection 50 mg  50 mg Intramuscular TID PRN White, Patrice L, NP       haloperidol lactate (HALDOL) injection 10 mg  10 mg Intramuscular TID PRN White, Patrice L, NP       And   diphenhydrAMINE (BENADRYL) injection 50 mg  50 mg Intramuscular TID PRN White, Patrice L, NP       And   LORazepam (ATIVAN) injection 2 mg  2 mg Intramuscular TID PRN White, Patrice L, NP       hydrOXYzine (ATARAX) tablet 25 mg  25 mg Oral Q6H PRN White, Patrice L, NP       loperamide (IMODIUM) capsule 2-4 mg  2-4 mg Oral PRN White, Patrice L, NP       magnesium hydroxide (MILK OF MAGNESIA) suspension 30 mL  30 mL Oral Daily PRN White, Patrice L, NP       multivitamin with minerals tablet 1 tablet  1 tablet Oral Daily White, Patrice L, NP   1 tablet at 09/19/23 1030   nicotine (NICODERM CQ - dosed in mg/24 hours) patch 14 mg  14 mg Transdermal Daily Eilah Common H, NP   14 mg at 09/19/23 1410   nicotine polacrilex (NICORETTE) gum 2 mg  2 mg Oral PRN Miguel Rota, MD   2 mg at 09/19/23 1559   ondansetron (ZOFRAN-ODT) disintegrating tablet 4 mg  4 mg Oral Q6H PRN White, Patrice L, NP       pantoprazole (PROTONIX) EC tablet 40 mg  40 mg Oral Daily White, Patrice L, NP   40 mg at 09/19/23 1030   traZODone (DESYREL) tablet 50 mg  50 mg Oral QHS PRN White, Patrice L, NP       PTA Medications: Medications Prior to Admission  Medication Sig Dispense Refill Last Dose/Taking   albuterol (VENTOLIN HFA) 108 (90 Base) MCG/ACT inhaler Inhale 1-2 puffs into the lungs every 6 (six) hours as needed for wheezing or shortness of breath. 6.7 g 0    buprenorphine-naloxone (SUBOXONE) 8-2 mg SUBL SL tablet Place 1 tablet under the tongue 3 (three) times daily.      omeprazole  (PRILOSEC) 20 MG capsule Take 1 capsule (20 mg total) by mouth daily. 30 capsule 0     Musculoskeletal: Strength & Muscle Tone: within normal limits Gait & Station: normal Patient leans: N/A            Psychiatric Specialty Exam:  Presentation  General Appearance:  Casual  Eye Contact: Good  Speech: Clear and Coherent; Normal Rate  Speech Volume: Normal  Handedness: Right   Mood and Affect  Mood: Euthymic  Affect: Congruent   Thought Process  Thought Processes: Coherent; Goal Directed  Duration of Psychotic Symptoms:N/A Past Diagnosis of Schizophrenia or Psychoactive disorder: No  Descriptions of Associations:Intact  Orientation:Full (Time, Place and Person)  Thought Content:Logical  Hallucinations:Hallucinations: None  Ideas of Reference:None  Suicidal Thoughts:Suicidal Thoughts: No  Homicidal Thoughts:Homicidal Thoughts: No   Sensorium  Memory: Immediate Fair; Remote Fair  Judgment: Fair  Insight: Fair   Art therapist  Concentration: Fair  Attention Span: Fair  Recall: [employer] of Knowledge: Fair  Language: Fair   Psychomotor Activity  Psychomotor Activity: Psychomotor Activity: Normal   Assets  Assets: Resilience   Sleep  Sleep: Sleep: Fair    Physical Exam: Physical Exam Vitals  and nursing note reviewed.  Constitutional:      General: He is not in acute distress.    Appearance: Normal appearance. He is normal weight. He is not ill-appearing.  Cardiovascular:     Rate and Rhythm: Normal rate.     Pulses: Normal pulses.  Pulmonary:     Effort: No respiratory distress.  Neurological:     General: No focal deficit present.     Mental Status: He is alert and oriented to person, place, and time.    Review of Systems  Constitutional: Negative.   Respiratory:  Negative for shortness of breath.   Cardiovascular:  Negative for chest pain and palpitations.  Gastrointestinal:  Negative  for constipation, diarrhea, nausea and vomiting.  Genitourinary:  Negative for dysuria, frequency and urgency.  Musculoskeletal:  Negative for falls.  Skin: Negative.   Neurological:  Negative for dizziness, tingling, tremors, seizures and headaches.  Psychiatric/Behavioral:  Positive for substance abuse. Negative for depression, hallucinations and suicidal ideas. The patient is not nervous/anxious and does not have insomnia.    Blood pressure 115/78, pulse 75, temperature 97.6 F (36.4 C), temperature source Oral, resp. rate 16, height 5\' 9"  (1.753 m), weight 88 kg, SpO2 96%. Body mass index is 28.65 kg/m.  Treatment Plan Summary: Daily contact with patient to assess and evaluate symptoms and progress in treatment and Medication management   ASSESSMENT: 38 year old male patient presenting with a history of substance use, including alcohol, cocaine, and opioids. He reports consuming three to four beers daily and using cocaine three to four times per week, with the onset of these behaviors following the passing of his mother in February 2021. He is currently on Suboxone for opioid use disorder, which he obtains through the [facility] Stop MAT program. He also has a history of COPD, managed with an albuterol inhaler as needed, and acid reflux. He denies any other illicit substance use, withdrawal symptoms, or psychiatric diagnoses. Sleep disturbances are noted but attributed to his work schedule.  Psychologically, the patient denies symptoms of depression, anxiety, PTSD, or psychosis. He has not engaged in therapy previously and expresses no interest in medication for mental health at this time. He identifies occasional disturbed sleep but does not report significant mood disturbances. His coping mechanisms appear limited, with substance use being a primary method of managing stress. He denies a history of trauma or self-harming behaviors.   Diagnoses / Active Problems: Principal Problem:   Polysubstance  abuse (HCC) Active Problems:   COPD (chronic obstructive pulmonary disease) (HCC)   Alcohol use disorder   Cocaine abuse (HCC)   Opioid use disorder   GERD (gastroesophageal reflux disease)   Tobacco use disorder   PLAN: Safety and Monitoring:             --  Voluntary admission to inpatient psychiatric unit for safety, stabilization and treatment             -- Daily contact with patient to assess and evaluate symptoms and progress in treatment             -- Patient's case to be discussed in multi-disciplinary team meeting             -- Observation Level : q15 minute checks             -- Vital signs:  q12 hours             -- Precautions: suicide, elopement, and assault   2. Psychiatric Diagnoses and Treatment:        #  Alcohol Use   Polysubstance Abuse   -- Resume Suboxone 8-2 mg per SL tablet, 1 tablet, sublingual, 2 times daily -- Continue CIWA assessment   -- Continue vitamin with minerals, 1 tablet, oral daily -- Continue hydroxyzine 25 mg, oral, every 6 hours as needed, anxiety, or CIWA score </=10 -- Continue Zofran disintegrating tablet 4 mg, oral, every 6 hours as needed, nausea, vomiting -- Continue Imodium 2-4 mg, oral, as needed, diarrhea or loose stools    --  The risks/benefits/side-effects/alternatives to this medication were discussed in detail with the patient and time was given for questions. The patient consents to medication trial.  -- FDA -- Metabolic profile and EKG monitoring obtained while on an atypical antipsychotic (BMI: Lipid Panel: HbgA1c: QTc:)    #Continue As Needed Medications --Trazodone 50 mg, oral, daily at bedtime as needed, sleep   #Continue BH Agitation Protocol --Haldol 5 mg, oral, 3 times daily as needed, mild agitation --Benadryl 50 mg, oral, 3 times daily as needed, mild agitation                                     OR  --Haldol injection 5 mg, IM, 3 times daily as needed, moderate agitation --Benadryl injection 50 mg, IM, 3 times  daily as needed, moderate  agitation --Ativan injection 2 mg, IM, 3 times daily as needed, moderate  agitation                                     OR --Haldol injection 10 mg, IM, 3 times daily as needed, severe agitation --Benadryl injection 50 mg, IM, 3 times daily as needed, severe  agitation --Ativan injection 2 mg, IM, 3 times daily as needed, severe agitation     -- Encouraged patient to participate in unit milieu and in scheduled  group therapies  -- Short Term Goals: Ability to identify changes in lifestyle to reduce recurrence of condition will improve, Ability to verbalize feelings will improve, Ability to disclose and discuss suicidal ideas, Ability to demonstrate self-control will improve, Ability to identify and develop effective coping behaviors will improve, Ability to maintain clinical measurements within normal limits will improve, Compliance with prescribed medications will improve, and Ability to identify triggers associated with substance abuse/mental health issues will improve -- Long Term Goals: Improvement in symptoms so as ready for discharge                3. Medical Issues Being Addressed:               #COPD -- Continue albuterol 108 MCG/ACT inhaler 1-2 puff, inhalation, every 6 hours as needed  #GERD -- Continue Protonix EC 40 mg, oral, Daily  #Tobacco Use Disorder             -- Continue Nicorette gum 2 mg as needed  -- Initiate nicotine patch 14 mg transdermal, daily             -- Smoking cessation encouraged    #Continue As Needed Medications --Tylenol 650 mg every 6 hours, as needed, mild pain, fever --Maalox/Mylanta 30 mL oral every 4 hours as needed, indigestion --Milk of Magnesia 30 mL oral daily as needed, mild constipation   4. Labs    CMP: BUN 5?, otherwise within defined range CBC: MCV 76.7?, MCH 24.2?, RDW 16.7^, otherwise  within defined range TSH : Within range Hemoglobin A1c: Within range Lipid Panel: Triglycerides 154^ Ethanol:  11^   UDS: + Buprenorphine, Cocaine    EKG: Normal sinus rhythm  QT/QTcB 392/429     5. Discharge Planning:              -- Social work and case management to assist with discharge planning and identification of hospital follow-up needs prior to discharge             -- Estimated LOS: 5-7 days             -- Discharge Concerns: Need to establish a safety plan; Medication compliance and effectiveness             -- Discharge Goals: Return home with outpatient referrals for mental health follow-up including medication management/psychotherapy      I certify that inpatient services furnished can reasonably be expected to improve the patient's condition.   I certify that inpatient services furnished can reasonably be expected to improve the patient's condition.    Norma Fredrickson, NP 4/11/20255:30 PM

## 2023-09-19 NOTE — Group Note (Signed)
 Date:  09/19/2023 Time:  8:35 AM  Group Topic/Focus:  Emotional Education:   The focus of this group is to discuss what feelings/emotions are, and how they are experienced.    Participation Level:  Did Not Attend   Erasmo Score 09/19/2023, 8:35 AM

## 2023-09-19 NOTE — Group Note (Signed)
 Recreation Therapy Group Note   Group Topic:Leisure Education  Group Date: 09/19/2023 Start Time: 0930 End Time: 0955 Facilitators: Lindsy Cerullo-McCall, LRT,CTRS Location: [address] Dayroom   Group Topic: Leisure Education  Goal Area(s) Addresses:  Patient will identify positive leisure activities for use post discharge. Patient will identify at least one positive benefit of participation in leisure activities.  Patient will work effectively work with peers in making sure activity goes as planned.  Intervention: [facility]   Activity: Patients were placed in a circle in the dayroom and given a beach ball. Patients were to play a game where they were to keep the ball moving at all times. LRT would time the group as they play the game. If the ball were to come to a stop, the time would start over.  Education:  Leisure Scientist, physiological, Special educational needs teacher, Teamwork, Discharge Planning  Education Outcome: Acknowledges education/In group clarification offered/Needs additional education.    Affect/Mood: N/A   Participation Level: Did not attend    Clinical Observations/Individualized Feedback:     Plan: Continue to engage patient in RT group sessions 2-3x/week.   Longino Trefz-McCall, LRT,CTRS 09/19/2023 11:10 AM

## 2023-09-19 NOTE — Progress Notes (Signed)
   09/19/23 0930  Psych Admission Type (Psych Patients Only)  Admission Status Voluntary/72 hour document signed  Psychosocial Assessment  Patient Complaints Substance abuse  Eye Contact Brief  Facial Expression Flat  Affect Appropriate to circumstance  Speech Logical/coherent  Interaction Assertive  Motor Activity Slow  Appearance/Hygiene Unremarkable  Behavior Characteristics Calm  Mood Pleasant  Thought Process  Coherency WDL  Content WDL  Delusions None reported or observed  Perception WDL  Hallucination None reported or observed  Judgment WDL  Confusion None  Danger to Self  Current suicidal ideation? Denies  Agreement Not to Harm Self Yes  Description of Agreement verbal  Danger to Others  Danger to Others None reported or observed

## 2023-09-19 NOTE — [facility] Suicide Risk Assessment (Signed)
 Suicide Risk Assessment  Admission Assessment    [facility] Admission Suicide Risk Assessment   Nursing information obtained from:  Patient Demographic factors:  Male Current Mental Status:  NA Loss Factors:  Legal issues Historical Factors:  NA Risk Reduction Factors:  Employed, Living with another person, especially a relative, Sense of responsibility to family  Total Time spent with patient: 30 minutes Principal Problem: Polysubstance abuse (HCC) Diagnosis:  Principal Problem:   Polysubstance abuse (HCC)  Subjective Data: Robert Eaton, 38 year old male [facility], seeking treatment for alcohol and cocaine use.  He has a documented psychiatric history of opioid use disorder and reported taking Suboxone daily.  A PHQ-9 screening was completed, yielding a total score of 7, consistent with mild depression.  The patient was subsequently admitted to the [facility] later that day for substance use treatment and medication management.  Continued Clinical Symptoms:  Alcohol Use Disorder Identification Test Final Score (AUDIT): 7 The "Alcohol Use Disorders Identification Test", Guidelines for Use in Primary Care, Second Edition.  World Science writer [facility]). Score between 0-7:  no or low risk or alcohol related problems. Score between 8-15:  moderate risk of alcohol related problems. Score between 16-19:  high risk of alcohol related problems. Score 20 or above:  warrants further diagnostic evaluation for alcohol dependence and treatment.   CLINICAL FACTORS:   Alcohol/Substance Abuse/Dependencies   Musculoskeletal: Strength & Muscle Tone: within normal limits Gait & Station: normal Patient leans: N/A  Psychiatric Specialty Exam:  Presentation  General Appearance:  Casual  Eye Contact: Good  Speech: Clear and Coherent; Normal Rate  Speech Volume: Normal  Handedness: Right   Mood and  Affect  Mood: Euthymic  Affect: Congruent   Thought Process  Thought Processes: Coherent; Goal Directed  Descriptions of Associations:Intact  Orientation:Full (Time, Place and Person)  Thought Content:Logical  History of Schizophrenia/Schizoaffective disorder:No  Duration of Psychotic Symptoms:No data recorded Hallucinations:Hallucinations: None  Ideas of Reference:None  Suicidal Thoughts:Suicidal Thoughts: No  Homicidal Thoughts:Homicidal Thoughts: No   Sensorium  Memory: Immediate Fair; Remote Fair  Judgment: Fair  Insight: Fair   Art therapist  Concentration: Fair  Attention Span: Fair  Recall: Fair  Fund of Knowledge: Fair  Language: Fair   Psychomotor Activity  Psychomotor Activity: Psychomotor Activity: Normal   Assets  Assets: Resilience   Sleep  Sleep: Sleep: Fair    Physical Exam: Physical Exam See H&P ROS See H&P Blood pressure 115/78, pulse 62, temperature 97.6 F (36.4 C), temperature source Oral, resp. rate 16, height 5\' 9"  (1.753 m), weight 88 kg, SpO2 96%. Body mass index is 28.65 kg/m.   COGNITIVE FEATURES THAT CONTRIBUTE TO RISK:  None    SUICIDE RISK:   Minimal: No identifiable suicidal ideation.  Patients presenting with no risk factors but with morbid ruminations; may be classified as minimal risk based on the severity of the depressive symptoms  PLAN OF CARE: See H&P  I certify that inpatient services furnished can reasonably be expected to improve the patient's condition.   Norma Fredrickson, NP 09/19/2023, 2:17 PM

## 2023-09-19 NOTE — Progress Notes (Signed)
Pt attended AA group and actively participated.  

## 2023-09-19 NOTE — BH IP Treatment Plan (Signed)
 Interdisciplinary Treatment and Diagnostic Plan Update  09/19/2023 Time of Session: 11:20 AM Robert Eaton MRN: [id number]  Principal Diagnosis: Polysubstance abuse (HCC)  Secondary Diagnoses: Principal Problem:   Polysubstance abuse (HCC) Active Problems:   COPD (chronic obstructive pulmonary disease) (HCC)   Current Medications:  Current Facility-Administered Medications  Medication Dose Route Frequency Provider Last Rate Last Admin   acetaminophen (TYLENOL) tablet 650 mg  650 mg Oral Q6H PRN White, Patrice L, NP       albuterol (VENTOLIN HFA) 108 (90 Base) MCG/ACT inhaler 1-2 puff  1-2 puff Inhalation Q6H PRN White, Patrice L, NP       alum & mag hydroxide-simeth (MAALOX/MYLANTA) 200-200-20 MG/5ML suspension 30 mL  30 mL Oral Q4H PRN White, Patrice L, NP       buprenorphine-naloxone (SUBOXONE) 8-2 mg per SL tablet 1 tablet  1 tablet Sublingual BID Izediuno, Delight Ovens, MD   1 tablet at 09/19/23 1655   haloperidol (HALDOL) tablet 5 mg  5 mg Oral TID PRN White, Patrice L, NP       And   diphenhydrAMINE (BENADRYL) capsule 50 mg  50 mg Oral TID PRN White, Patrice L, NP       haloperidol lactate (HALDOL) injection 5 mg  5 mg Intramuscular TID PRN White, Patrice L, NP       And   diphenhydrAMINE (BENADRYL) injection 50 mg  50 mg Intramuscular TID PRN White, Patrice L, NP       haloperidol lactate (HALDOL) injection 10 mg  10 mg Intramuscular TID PRN White, Patrice L, NP       And   diphenhydrAMINE (BENADRYL) injection 50 mg  50 mg Intramuscular TID PRN White, Patrice L, NP       And   LORazepam (ATIVAN) injection 2 mg  2 mg Intramuscular TID PRN White, Patrice L, NP       hydrOXYzine (ATARAX) tablet 25 mg  25 mg Oral Q6H PRN White, Patrice L, NP       loperamide (IMODIUM) capsule 2-4 mg  2-4 mg Oral PRN White, Patrice L, NP       magnesium hydroxide (MILK OF MAGNESIA) suspension 30 mL  30 mL Oral Daily PRN White, Patrice L, NP       multivitamin with minerals tablet 1 tablet  1 tablet  Oral Daily White, Patrice L, NP   1 tablet at 09/19/23 1030   nicotine (NICODERM CQ - dosed in mg/24 hours) patch 14 mg  14 mg Transdermal Daily Bennett, Christal H, NP   14 mg at 09/19/23 1410   nicotine polacrilex (NICORETTE) gum 2 mg  2 mg Oral PRN Miguel Rota, MD   2 mg at 09/19/23 1559   ondansetron (ZOFRAN-ODT) disintegrating tablet 4 mg  4 mg Oral Q6H PRN White, Patrice L, NP       pantoprazole (PROTONIX) EC tablet 40 mg  40 mg Oral Daily White, Patrice L, NP   40 mg at 09/19/23 1030   traZODone (DESYREL) tablet 50 mg  50 mg Oral QHS PRN White, Patrice L, NP       PTA Medications: Medications Prior to Admission  Medication Sig Dispense Refill Last Dose/Taking   albuterol (VENTOLIN HFA) 108 (90 Base) MCG/ACT inhaler Inhale 1-2 puffs into the lungs every 6 (six) hours as needed for wheezing or shortness of breath. 6.7 g 0    buprenorphine-naloxone (SUBOXONE) 8-2 mg SUBL SL tablet Place 1 tablet under the tongue 3 (three) times daily.  omeprazole (PRILOSEC) 20 MG capsule Take 1 capsule (20 mg total) by mouth daily. 30 capsule 0     Patient Stressors: Neurosurgeon issue   Substance abuse    Patient Strengths: Forensic psychologist fund of knowledge  Supportive family/friends   Treatment Modalities: Medication Management, Group therapy, Case management,  1 to 1 session with clinician, Psychoeducation, Recreational therapy.   Physician Treatment Plan for Primary Diagnosis: Polysubstance abuse (HCC) Long Term Goal(s):     Short Term Goals:    Medication Management: Evaluate patient's response, side effects, and tolerance of medication regimen.  Therapeutic Interventions: 1 to 1 sessions, Unit Group sessions and Medication administration.  Evaluation of Outcomes: Not Progressing  Physician Treatment Plan for Secondary Diagnosis: Principal Problem:   Polysubstance abuse (HCC) Active Problems:   COPD (chronic obstructive pulmonary disease)  (HCC)  Long Term Goal(s):     Short Term Goals:       Medication Management: Evaluate patient's response, side effects, and tolerance of medication regimen.  Therapeutic Interventions: 1 to 1 sessions, Unit Group sessions and Medication administration.  Evaluation of Outcomes: Not Progressing   RN Treatment Plan for Primary Diagnosis: Polysubstance abuse (HCC) Long Term Goal(s): Knowledge of disease and therapeutic regimen to maintain health will improve  Short Term Goals: Ability to remain free from injury will improve, Ability to verbalize frustration and anger appropriately will improve, Ability to demonstrate self-control, Ability to participate in decision making will improve, Ability to verbalize feelings will improve, Ability to disclose and discuss suicidal ideas, Ability to identify and develop effective coping behaviors will improve, and Compliance with prescribed medications will improve  Medication Management: RN will administer medications as ordered by provider, will assess and evaluate patient's response and provide education to patient for prescribed medication. RN will report any adverse and/or side effects to prescribing provider.  Therapeutic Interventions: 1 on 1 counseling sessions, Psychoeducation, Medication administration, Evaluate responses to treatment, Monitor vital signs and CBGs as ordered, Perform/monitor CIWA, COWS, AIMS and Fall Risk screenings as ordered, Perform wound care treatments as ordered.  Evaluation of Outcomes: Not Progressing   LCSW Treatment Plan for Primary Diagnosis: Polysubstance abuse (HCC) Long Term Goal(s): Safe transition to appropriate next level of care at discharge, Engage patient in therapeutic group addressing interpersonal concerns.  Short Term Goals: Engage patient in aftercare planning with referrals and resources, Increase social support, Increase ability to appropriately verbalize feelings, Increase emotional regulation,  Facilitate acceptance of mental health diagnosis and concerns, Facilitate patient progression through stages of change regarding substance use diagnoses and concerns, and Identify triggers associated with mental health/substance abuse issues  Therapeutic Interventions: Assess for all discharge needs, 1 to 1 time with Social worker, Explore available resources and support systems, Assess for adequacy in community support network, Educate family and significant other(s) on suicide prevention, Complete Psychosocial Assessment, Interpersonal group therapy.  Evaluation of Outcomes: Not Progressing   Progress in Treatment: Attending groups: No. Participating in groups: No. Taking medication as prescribed: Yes. Toleration medication: Yes. Family/Significant other contact made: constents are pending Patient understands diagnosis: Yes. Discussing patient identified problems/goals with staff: Yes. Medical problems stabilized or resolved: Yes. Denies suicidal/homicidal ideation: Yes. Issues/concerns per patient self-inventory: No.  New problem(s) identified:  No  New Short Term/Long Term Goal(s):    medication stabilization, elimination of SI thoughts, development of comprehensive mental wellness plan.   Patient Goals:  "I wanted to get away from everyone and clear my thoughts."  Patient admitted to  drinking alcohol excessively and using cocaine but wasn't interested in rehab at this time.  Discharge Plan or Barriers:  Patient recently admitted. CSW will continue to follow and assess for appropriate referrals and possible discharge planning.    Reason for Continuation of Hospitalization: Medication stabilization  Estimated Length of Stay:  5 - 7 days  Last 3 [place] Suicide Severity Risk Score: Flowsheet Row [facility]Current[facility] Most recent reading at 09/18/2023  4:00 PM ED from 09/18/2023 in [facility] Most recent reading at 09/18/2023  4:26 AM ED from 01/11/2022 in [facility] at [facility] Most recent reading at 01/11/2022  2:35 AM  C-SSRS RISK CATEGORY No Risk No Risk No Risk       Last PHQ 2/9 Scores:    09/18/2023    3:44 AM  Depression screen PHQ 2/9  Decreased Interest 1  Down, Depressed, Hopeless 1  PHQ - 2 Score 2  Altered sleeping 2  Tired, decreased energy 1  Change in appetite 0  Feeling bad or failure about yourself  1  Trouble concentrating 1  Moving slowly or fidgety/restless 0  Suicidal thoughts 0  PHQ-9 Score 7  Difficult doing work/chores Somewhat difficult    Scribe for Treatment Team: Alla Feeling, LCSWA 09/19/2023 5:20 PM

## 2023-09-19 NOTE — Plan of Care (Signed)
   Problem: Education: Goal: Knowledge of [facility] Education information/materials will improve Outcome: Progressing Goal: Emotional status will improve Outcome: Progressing Goal: Mental status will improve Outcome: Progressing Goal: Verbalization of understanding the information provided will improve Outcome: Progressing

## 2023-09-20 DIAGNOSIS — F1910 Other psychoactive substance abuse, uncomplicated: Secondary | ICD-10-CM | POA: Diagnosis not present

## 2023-09-20 DIAGNOSIS — Z419 Encounter for procedure for purposes other than remedying health state, unspecified: Secondary | ICD-10-CM | POA: Diagnosis not present

## 2023-09-20 NOTE — Plan of Care (Signed)
 ?  Problem: Activity: ?Goal: Interest or engagement in activities will improve ?Outcome: Progressing ?Goal: Sleeping patterns will improve ?Outcome: Progressing ?  ?Problem: Coping: ?Goal: Ability to verbalize frustrations and anger appropriately will improve ?Outcome: Progressing ?Goal: Ability to demonstrate self-control will improve ?Outcome: Progressing ?  ?Problem: Safety: ?Goal: Periods of time without injury will increase ?Outcome: Progressing ?  ?

## 2023-09-20 NOTE — Group Note (Signed)
 Date:  09/20/2023 Time:  9:17 AM  Group Topic/Focus:  Overcoming Stress:   The focus of this group is to define stress and help patients assess their triggers.    Participation Level:  Did Not Attend  Robert Eaton 09/20/2023, 9:17 AM

## 2023-09-20 NOTE — Plan of Care (Signed)
   Problem: Education: Goal: Knowledge of [facility] Education information/materials will improve Outcome: Progressing Goal: Emotional status will improve Outcome: Progressing Goal: Mental status will improve Outcome: Progressing Goal: Verbalization of understanding the information provided will improve Outcome: Progressing

## 2023-09-20 NOTE — Progress Notes (Signed)
   09/20/23 0900  Psych Admission Type (Psych Patients Only)  Admission Status Voluntary/72 hour document signed  Psychosocial Assessment  Patient Complaints Substance abuse  Eye Contact Fair  Facial Expression Animated  Affect Appropriate to circumstance  Speech Logical/coherent  Interaction Assertive  Motor Activity Slow  Appearance/Hygiene Unremarkable  Behavior Characteristics Appropriate to situation  Mood Anxious;Pleasant  Thought Process  Coherency WDL  Content WDL  Delusions None reported or observed  Perception WDL  Hallucination None reported or observed  Judgment WDL  Confusion None  Danger to Self  Current suicidal ideation? Denies  Agreement Not to Harm Self Yes  Description of Agreement Verbal  Danger to Others  Danger to Others None reported or observed

## 2023-09-20 NOTE — Group Note (Signed)
 Date:  09/20/2023 Time:  2:01 PM  Group Topic/Focus:  Wellness Toolbox:   The focus of this group is to discuss various aspects of wellness, balancing those aspects and exploring ways to increase the ability to experience wellness.  Patients will create a wellness toolbox for use upon discharge.    Participation Level:  Active  Participation Quality:  Appropriate  Affect:  Appropriate  Robert Eaton 09/20/2023, 2:01 PM

## 2023-09-20 NOTE — [facility] Counselor (Signed)
 Adult Comprehensive Assessment  Patient ID: Robert Eaton, male   DOB: Mar 22, 1986, 38 y.o.   MRN: [id number]  Information Source: Information source: Patient  Current Stressors:  Patient states their primary concerns and needs for treatment are:: The patient stated that it was for drug use Patient states their goals for this hospitilization and ongoing recovery are:: The patient share that he wants to get better and making better decision Educational / Learning stressors: none reported Employment / Job issues: None reported Family Relationships: none reported Surveyor, quantity / Lack of resources (include bankruptcy): none reported Housing / Lack of housing: none reported Physical health (include injuries & life threatening diseases): none reported Social relationships: none repoirted Substance abuse: none reported Bereavement / Loss: none reportred  Living/Environment/Situation:  Living Arrangements: Parent, Other relatives Living conditions (as described by patient or guardian): The patient share that its good Who else lives in the home?: The patient share that its his father and brother How long has patient lived in current situation?: 4 months What is atmosphere in current home: Comfortable, Paramedic, Supportive  Family History:  Marital status: Single Are you sexually active?: No What is your sexual orientation?: heterosexual Has your sexual activity been affected by drugs, alcohol, medication, or emotional stress?: none reported Does patient have children?: No  Childhood History:  By whom was/is the patient raised?: Father Additional childhood history information: none reported Description of patient's relationship with caregiver when they were a child: The patient share that its good Patient's description of current relationship with people who raised him/her: The patient share that its good How were you disciplined when you got in trouble as a child/adolescent?: The patient share  that he gets a whooping Does patient have siblings?: Yes Number of Siblings: 1 Description of patient's current relationship with siblings: The patient share that its goof Did patient suffer any verbal/emotional/physical/sexual abuse as a child?: No Did patient suffer from severe childhood neglect?: No Has patient ever been sexually abused/assaulted/raped as an adolescent or adult?: No Was the patient ever a victim of a crime or a disaster?: No Has patient been affected by domestic violence as an adult?: No  Education:  Highest grade of school patient has completed: 10th grade Currently a student?: No Learning disability?: No  Employment/Work Situation:   Employment Situation: Employed Where is Patient Currently Employed?: 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?: No  Financial Resources:   Financial resources: Income from employment Does patient have a representative payee or guardian?: No  Alcohol/Substance Abuse:   What has been your use of drugs/alcohol within the last 12 months?: The patient share a little bit of cocaine and drinks If attempted suicide, did drugs/alcohol play a role in this?: No Alcohol/Substance Abuse Treatment Hx: Denies past history If yes, describe treatment: none reported Has alcohol/substance abuse ever caused legal problems?: Yes  Social Support System:   Patient's Community Support System: Fair Museum/gallery exhibitions officer System: The patient stated that his father and friends Type of faith/religion: chirstian How does patient's faith help to cope with current illness?: The patient share that he wakes him up  everyday  Leisure/Recreation:   Do  You Have Hobbies?: Yes Leisure and Hobbies: shoot pool, watch a movie  Strengths/Needs:   What is the patient's perception of their strengths?: The patient share that he is a good motivator and listening Patient states they can use these personal strengths during their treatment to contribute to their recovery: The patient share that he can make good decision that will better him Patient states these barriers may affect/interfere with their treatment: The patient share tha is he Patient states these barriers may affect their return to the community: The patient stated that the barrier would be not being discharge on time and he will missed his parole officer meeting Other important information patient would like considered in planning for their treatment: NA  Discharge Plan:   Currently receiving community mental health services: No Patient states concerns and preferences for aftercare planning are: The patient stated that he does not know Patient states they will know when they are safe and ready for discharge when: The patient share that he will safe to discharge tomorrow. Does patient have access to transportation?: Yes Does patient have financial barriers related to discharge medications?: No Patient description of barriers related to discharge medications: The patient share that he is good Will patient be returning to same living situation after discharge?: Yes  Summary/Recommendations:   Summary and Recommendations (to be completed by the evaluator): Robert Eaton 38 year old Caucasian male. The patient voluntary [facility] and signed seventy-two hours request for voluntary committed. The client was later transfer to [facility] for alcohol, cocaine, opioid and tobacco use disorder. The patient denies SI, HI and AVH at the current time. The patient endorses cocaine and alcohol use. The patient stated that he has a history of drug abuse, and it  has caused him to have legal issue. The patient share that he was release from prison four months ago and is currently living with his father and his brother. The client that reports that he has an ankle bracelet and has to "be discharge 09/21/2023." The patient shared that is "trying to get his life straight." The patient stated that he has to check in with his parole office on Monday morning that is also one of the reasons that he needs to be discharged on 09/21/2023. The patient share he has been experiencing urges to use and that he would go back to doing his drug that was why he check himself in to [facility].  Patient will benefit from crisis stabilization, medication evaluation, group therapy and psychoeducation, in addition to case management for discharge planning. At discharge it is recommended that Patient adhere to the established discharge plan and continue in treatment  Robert Eaton. 09/20/2023

## 2023-09-20 NOTE — Group Note (Signed)
 Date:  09/20/2023 Time:  8:57 PM  Group Topic/Focus:  Wrap-Up Group:   The focus of this group is to help patients review their daily goal of treatment and discuss progress on daily workbooks.    Participation Level:  Active  Participation Quality:  Appropriate  Affect:  Appropriate  Cognitive:  Appropriate  Insight: Appropriate  Engagement in Group:  Engaged  Modes of Intervention:  Socialization  Additional Comments:  Pt attended the evening wrap-up group. Tech introduced the staff for the evening, reminded group of the evening schedule and reminded them to ask for anything they need. PT played a game to support memory and socialization. Pt socialized well and encouraged other pts.  Carmon Christen 09/20/2023, 8:57 PM

## 2023-09-20 NOTE — [facility] Suicide Risk Assessment (Signed)
 [facility] INPATIENT:  Family/Significant Other Suicide Prevention Education  Suicide Prevention Education:  Education Completed; Brave Dack,  (the patient ) has been identified by the patient as the family member/significant other with whom the patient will be residing, and identified as the person(s) who will aid the patient in the event of a mental health crisis (suicidal ideations/suicide attempt).  With written consent from the patient, the family member/significant other has been provided the following suicide prevention education, prior to the and/or following the discharge of the patient.  The suicide prevention education provided includes the following: Suicide risk factors Suicide prevention and interventions National Suicide Hotline telephone number [facility] assessment telephone number [facility] Emergency Assistance 911 [facility] and/or Residential Mobile Crisis Unit telephone number  Request made of family/significant other to: Remove weapons (e.g., guns, rifles, knives), all items previously/currently identified as safety concern.   Remove drugs/medications (over-the-counter, prescriptions, illicit drugs), all items previously/currently identified as a safety concern.  The family member/significant other verbalizes understanding of the suicide prevention education information provided.  The family member/significant other agrees to remove the items of safety concern listed above.  Justyce Baby O Awais Cobarrubias 09/20/2023, 4:18 PM

## 2023-09-20 NOTE — Progress Notes (Signed)
 Psychiatric Admission Assessment Adult  Patient Identification: Robert Eaton MRN:  [id number] Date of Evaluation:  09/20/2023 Chief Complaint:  Polysubstance abuse (HCC) [F19.10] Principal Diagnosis: Polysubstance abuse (HCC) Diagnosis:  Principal Problem:   Polysubstance abuse (HCC) Active Problems:   COPD (chronic obstructive pulmonary disease) (HCC)   Alcohol use disorder   Cocaine abuse (HCC)   Opioid use disorder   GERD (gastroesophageal reflux disease)   Tobacco use disorder  History of Present Illness: Robert Eaton, 38 year old male [facility], [facility], seeking treatment for alcohol and cocaine use.  He has a documented psychiatric history of opioid use disorder and reported taking Suboxone daily.  A PHQ-9 screening was completed, yielding a total score of 7, consistent with mild depression.  The patient was subsequently admitted to the [facility] later that day for substance use treatment and medication management.  The nursing staff reported that the patient signed a seventy-two-hour request upon admission to the [facility], indicating his intention to stay for only seventy-two hours. This form was signed on September 18, 2023, at 16:30.  Yesterday the psychiatry team made the following recommendations: Continue Suboxone 8-2 mg per SL tablet, 1 tablet, sublingual, 2 times daily -- Continue CIWA assessment   -- Continue vitamin with minerals, 1 tablet, oral daily -- Continue hydroxyzine 25 mg, oral, every 6 hours as needed, anxiety, or CIWA score </=10 -- Continue Zofran disintegrating tablet 4 mg, oral, every 6 hours as needed, nausea, vomiting -- Continue Imodium 2-4 mg, oral, as needed, diarrhea or loose stools  Today's assessment notes: On assessment today, the pt reports that his mood is euthymic, improved since admission, and stable. Denies feeling down, depressed, or sad.  Patient  states,"I signed a 72-hour for discharge on 09/18/23 at 4:30 PM, and I will like to be discharged tomorrow.  I feel fine and more stable now."  He presents alert, calm, cooperative, and oriented to person, time, place, and situation.  Speech clear, coherent with normal volume and pattern.  No alcohol or drug withdrawal symptoms delirium tremens, withdrawal seizures, or withdrawal symptoms observed.  He denies delusional thinking or paranoia.  He further denies SI, HI, or AVH.   Reports that anxiety symptoms are at manageable level.  Sleep is stable. Appetite is stable.  Concentration is without complaint.  Energy level is adequate. Denies having any suicidal thoughts. Denies having any suicidal intent and plan.  Denies having any HI.  Denies having psychotic symptoms.   Denies having side effects to current psychiatric medications.   Discussed discharge planning:  How to identify the signs of impending crisis, use of internal coping strategies, reaching out to friends and family that can help navigate a crisis, and a list of mental health professionals and agencies to call. Further to follow up on her mental health appointments and her PCP appointments.       He reported legal issues, including being on parole with six months remaining of a nine-month sentence for obtaining property by false pretenses. Patient reported he was recently released from prison in January 2025.  The patient reported his medical history includes COPD, for which he uses an albuterol inhaler as needed, and a history of acid reflux and opioid abuse. He reported an injury in 2015 that led to opioid use.   The case was reviewed with the attending psychiatrist, V. Izediuno, The patient is currently on Suboxone, which is managed through the [facility] Stop MAT program.  The plan includes maintaining the current Suboxone regimen.  Associated Signs/Symptoms: Depression Symptoms:   Denies  (Hypo) Manic Symptoms:   Denies  Anxiety  Symptoms:   Denies  Psychotic Symptoms:   Denies  PTSD Symptoms: Denies NA Total Time spent with patient: 1 hour  Past Psychiatric History: Polysubstance abuse, Alcohol use  - Denies any past psychiatric diagnoses. - Denies any history of psychiatric medications. - Denies any history of psychotherapy or psychiatric hospitalizations. - Reports participation in the [facility] Stop MAT program since January for opioid abuse. - Reports a history of substance detox treatment. - Denies any history of self-harm or suicidal attempts. - Denies any history of trauma. - Denies symptoms of mania, PTSD, psychosis, anxiety, or depression, except occasional disturbed sleep attributed to work schedule.  Current Medication:  - Suboxone daily. - Albuterol inhaler as needed.  Substance Use  - Tobacco: Smokes a pack and a half of cigarettes per day. - Alcohol: Consumes three to four 12-ounce beers daily, started in February 2021 after his mother passed. - Other Drugs: Uses cocaine three to four times per week, started in February 2021 after his mother passed. Denies any other illicit substance use.  Social History The patient resides in [place] with his father and oldest brother. He has never been married and has three adult children. His highest level of education is tenth grade. 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 His support system includes his father and brother. He does not affiliate with any religion and identifies his hobbies as watching movies. He is straight and identifies as male. There is no military background or history of violence, but he is currently on parole, having been released from prison in January after serving time for obtaining property by false pretenses. He has six months remaining on his nine-month parole. The patient smokes a pack and a half of cigarettes per day.  Is the patient at risk to self? No.  Has the patient been a risk to self in the past 6 months? No.   Has the patient been a risk to self within the distant past? No.  Is the patient a risk to others? No.  Has the patient been a risk to others in the past 6 months? No.  Has the patient been a risk to others within the distant past? No.   [place] Scale:  Flowsheet Row [facility] (Current[facility] Most recent reading at 09/18/2023  4:00 PM ED from 09/18/2023 in [facility] Most recent reading at 09/18/2023  4:26 AM ED from 01/11/2022 in [facility] Emergency Department at [facility] Most recent reading at 01/11/2022  2:35 AM  C-SSRS RISK CATEGORY No Risk No Risk No Risk      Prior Inpatient Therapy: No. Denies   Prior Outpatient Therapy: No. Denies    Alcohol Screening: 1. How often do you have a drink containing alcohol?: 4 or more times a week 2. How many drinks containing alcohol do you have on a typical day when you are drinking?: 3 or 4 3. How often do you have six or more drinks on one occasion?: Less than monthly AUDIT-C Score: 6 4. How often during the last year have you found that you were not able to stop drinking once you had started?: Less than monthly 5. How often during the last year have you failed to do what was normally expected from you because of drinking?: Never  6. How often during the last year have you needed a first drink in the morning to get yourself going after a heavy drinking session?: Never 7. How often during the last year have you had a feeling of guilt of remorse after drinking?: Never (0) 8. How often during the last year have you been unable to remember what happened the night before because you had been drinking?: Never 9. Have you or someone else been injured as a result of your drinking?: No 10. Has a relative or friend or a doctor or another health worker been concerned about your drinking or suggested you cut down?: No Alcohol Use Disorder Identification Test Final  Score (AUDIT): 7 Alcohol Brief Interventions/Follow-up: Alcohol education/Brief advice Substance Abuse History in the last 12 months:  Yes.   Consequences of Substance Abuse: Legal Consequences:  As listed above  Previous Psychotropic Medications: No  Psychological Evaluations: No  Past Medical History:  Past Medical History:  Diagnosis Date   COPD (chronic obstructive pulmonary disease) (HCC)    Irregular heartbeat    History reviewed. No pertinent surgical history. Family History: History reviewed. No pertinent family history. Family Psychiatric  History: Denies  Tobacco Screening:  Social History   Tobacco Use  Smoking Status Every Day   Current packs/day: 1.50   Types: Cigarettes  Smokeless Tobacco Never    BH Tobacco Counseling     Are you interested in Tobacco Cessation Medications?  No value filed. Counseled patient on smoking cessation:  No value filed. Reason Tobacco Screening Not Completed: No value filed.    Social History:  Social History   Substance and Sexual Activity  Alcohol Use Yes   Comment: several beers per day     Social History   Substance and Sexual Activity  Drug Use Yes   Types: Marijuana    Additional Social History: Marital status: Single Are you sexually active?: No What is your sexual orientation?: heterosexual Has your sexual activity been affected by drugs, alcohol, medication, or emotional stress?: none reported Does patient have children?: No    Allergies:  No Known Allergies Lab Results:  No results found for this or any previous visit (from the past 48 hours).  Blood Alcohol level:  Lab Results  Component Value Date   ETH 11 (H) 09/18/2023   ETH <10 12/09/2020    Metabolic Disorder Labs:  Lab Results  Component Value Date   HGBA1C 5.6 09/18/2023   MPG 114.02 09/18/2023   No results found for: "PROLACTIN" Lab Results  Component Value Date   CHOL 156 09/18/2023   TRIG 154 (H) 09/18/2023   HDL 54 09/18/2023    CHOLHDL 2.9 09/18/2023   VLDL 31 09/18/2023   LDLCALC 71 09/18/2023    Current Medications: Current Facility-Administered Medications  Medication Dose Route Frequency Provider Last Rate Last Admin   acetaminophen (TYLENOL) tablet 650 mg  650 mg Oral Q6H PRN White, Patrice L, NP       albuterol (VENTOLIN HFA) 108 (90 Base) MCG/ACT inhaler 1-2 puff  1-2 puff Inhalation Q6H PRN White, Patrice L, NP       alum & mag hydroxide-simeth (MAALOX/MYLANTA) 200-200-20 MG/5ML suspension 30 mL  30 mL Oral Q4H PRN White, Patrice L, NP       buprenorphine-naloxone (SUBOXONE) 8-2 mg per SL tablet 1 tablet  1 tablet Sublingual BID Izediuno, Iline Mallory, MD   1 tablet at 09/20/23 0821   haloperidol (HALDOL) tablet 5 mg  5 mg Oral TID PRN White,  Patrice L, NP       And   diphenhydrAMINE (BENADRYL) capsule 50 mg  50 mg Oral TID PRN White, Patrice L, NP       haloperidol lactate (HALDOL) injection 5 mg  5 mg Intramuscular TID PRN White, Patrice L, NP       And   diphenhydrAMINE (BENADRYL) injection 50 mg  50 mg Intramuscular TID PRN White, Patrice L, NP       haloperidol lactate (HALDOL) injection 10 mg  10 mg Intramuscular TID PRN White, Patrice L, NP       And   diphenhydrAMINE (BENADRYL) injection 50 mg  50 mg Intramuscular TID PRN White, Patrice L, NP       And   LORazepam (ATIVAN) injection 2 mg  2 mg Intramuscular TID PRN White, Patrice L, NP       hydrOXYzine (ATARAX) tablet 25 mg  25 mg Oral Q6H PRN White, Patrice L, NP       loperamide (IMODIUM) capsule 2-4 mg  2-4 mg Oral PRN White, Patrice L, NP       magnesium hydroxide (MILK OF MAGNESIA) suspension 30 mL  30 mL Oral Daily PRN White, Patrice L, NP       multivitamin with minerals tablet 1 tablet  1 tablet Oral Daily White, Patrice L, NP   1 tablet at 09/20/23 0821   nicotine (NICODERM CQ - dosed in mg/24 hours) patch 14 mg  14 mg Transdermal Daily Bennett, Christal H, NP   14 mg at 09/20/23 0821   nicotine polacrilex (NICORETTE) gum 2 mg  2 mg Oral  PRN Zouev, Dmitri, MD   2 mg at 09/20/23 1034   ondansetron (ZOFRAN-ODT) disintegrating tablet 4 mg  4 mg Oral Q6H PRN White, Patrice L, NP       pantoprazole (PROTONIX) EC tablet 40 mg  40 mg Oral Daily White, Patrice L, NP   40 mg at 09/20/23 0821   traZODone (DESYREL) tablet 50 mg  50 mg Oral QHS PRN White, Patrice L, NP   50 mg at 09/19/23 2139   PTA Medications: Medications Prior to Admission  Medication Sig Dispense Refill Last Dose/Taking   albuterol (VENTOLIN HFA) 108 (90 Base) MCG/ACT inhaler Inhale 1-2 puffs into the lungs every 6 (six) hours as needed for wheezing or shortness of breath. 6.7 g 0    buprenorphine-naloxone (SUBOXONE) 8-2 mg SUBL SL tablet Place 1 tablet under the tongue 3 (three) times daily.      omeprazole (PRILOSEC) 20 MG capsule Take 1 capsule (20 mg total) by mouth daily. 30 capsule 0    Musculoskeletal: Strength & Muscle Tone: within normal limits Gait & Station: normal Patient leans: N/A  Psychiatric Specialty Exam:  Presentation  General Appearance:  Appropriate for Environment; Casual  Eye Contact: Good  Speech: Clear and Coherent; Normal Rate  Speech Volume: Normal  Handedness: Right  Mood and Affect  Mood: Euthymic  Affect: Appropriate; Congruent  Thought Process  Thought Processes: Coherent  Duration of Psychotic Symptoms:N/A Past Diagnosis of Schizophrenia or Psychoactive disorder: No  Descriptions of Associations:Intact  Orientation:Full (Time, Place and Person)  Thought Content:Logical  Hallucinations:Hallucinations: None  Ideas of Reference:None  Suicidal Thoughts:Suicidal Thoughts: No  Homicidal Thoughts:Homicidal Thoughts: No  Sensorium  Memory: Immediate Good; Recent Good  Judgment: Fair  Insight: Fair  Executive Functions  Concentration: Good  Attention Span: Good  Recall: Good  Fund of Knowledge: Fair  Language: Good  Psychomotor Activity  Psychomotor Activity: Psychomotor  Activity: Normal  Assets  Assets: Communication Skills; Physical Health; Resilience   Sleep  Sleep: Sleep: Good Number of Hours of Sleep: 7.5  Physical Exam: Physical Exam Vitals and nursing note reviewed.  Constitutional:      General: He is not in acute distress.    Appearance: Normal appearance. He is normal weight. He is not ill-appearing.  HENT:     Head: Normocephalic.     Right Ear: External ear normal.     Nose: Nose normal.     Mouth/Throat:     Mouth: Mucous membranes are moist.     Pharynx: Oropharynx is clear.  Eyes:     Extraocular Movements: Extraocular movements intact.  Cardiovascular:     Rate and Rhythm: Normal rate.     Pulses: Normal pulses.  Pulmonary:     Effort: No respiratory distress.  Abdominal:     Comments: Deferred  Genitourinary:    Comments: Deferred Musculoskeletal:        General: Normal range of motion.  Neurological:     General: No focal deficit present.     Mental Status: He is alert and oriented to person, place, and time.  Psychiatric:        Mood and Affect: Mood normal.        Behavior: Behavior normal.    Review of Systems  Constitutional: Negative.   Respiratory:  Negative for shortness of breath.   Cardiovascular:  Negative for chest pain and palpitations.  Gastrointestinal:  Negative for constipation, diarrhea, nausea and vomiting.  Genitourinary:  Negative for dysuria, frequency and urgency.  Musculoskeletal:  Negative for falls.  Skin: Negative.   Neurological:  Negative for dizziness, tingling, tremors, seizures and headaches.  Psychiatric/Behavioral:  Positive for substance abuse. Negative for depression, hallucinations and suicidal ideas. The patient is not nervous/anxious and does not have insomnia.    Blood pressure 109/73, pulse 79, temperature 98.4 F (36.9 C), temperature source Oral, resp. rate 16, height 5\' 9"  (1.753 m), weight 88 kg, SpO2 97%. Body mass index is 28.65 kg/m.  Treatment Plan  Summary: Daily contact with patient to assess and evaluate symptoms and progress in treatment and Medication management ASSESSMENT:`  38 year old male patient presenting with a history of substance use, including alcohol, cocaine, and opioids. He reports consuming three to four beers daily and using cocaine three to four times per week, with the onset of these behaviors following the passing of his mother in February 2021. He is currently on Suboxone for opioid use disorder, which he obtains through the [facility] Stop MAT program. He also has a history of COPD, managed with an albuterol inhaler as needed, and acid reflux. He denies any other illicit substance use, withdrawal symptoms, or psychiatric diagnoses. Sleep disturbances are noted but attributed to his work schedule.  Psychologically, the patient denies symptoms of depression, anxiety, PTSD, or psychosis. He has not engaged in therapy previously and expresses no interest in medication for mental health at this time. He identifies occasional disturbed sleep but does not report significant mood disturbances. His coping mechanisms appear limited, with substance use being a primary method of managing stress. He denies a history of trauma or self-harming behaviors.   Diagnoses / Active Problems: Principal Problem:   Polysubstance abuse (HCC) Active Problems:   COPD (chronic obstructive pulmonary disease) (HCC)   Alcohol use disorder   Cocaine abuse (HCC)   Opioid use disorder   GERD (gastroesophageal reflux disease)   Tobacco use disorder   PLAN: Safety  and Monitoring:             --  Voluntary admission to inpatient psychiatric unit for safety, stabilization and treatment             -- Daily contact with patient to assess and evaluate symptoms and progress in treatment             -- Patient's case to be discussed in multi-disciplinary team meeting             -- Observation Level : q15 minute checks             -- Vital signs:  q12 hours              -- Precautions: suicide, elopement, and assault   2. Psychiatric Diagnoses and Treatment:        #Alcohol Use   Polysubstance Abuse   -- Continue Suboxone 8-2 mg per SL tablet, 1 tablet, sublingual, 2 times daily -- Continue CIWA assessment   -- Continue vitamin with minerals, 1 tablet, oral daily -- Continue hydroxyzine 25 mg, oral, every 6 hours as needed, anxiety, or CIWA score </=10 -- Continue Zofran disintegrating tablet 4 mg, oral, every 6 hours as needed, nausea, vomiting -- Continue Imodium 2-4 mg, oral, as needed, diarrhea or loose stools  --  The risks/benefits/side-effects/alternatives to this medication were discussed in detail with the patient and time was given for questions. The patient consents to medication trial.  -- FDA -- Metabolic profile and EKG monitoring obtained while on an atypical antipsychotic (BMI: Lipid Panel: HbgA1c: QTc:)    #Continue As Needed Medications --Trazodone 50 mg, oral, daily at bedtime as needed, sleep   #Continue BH Agitation Protocol --Haldol 5 mg, oral, 3 times daily as needed, mild agitation --Benadryl 50 mg, oral, 3 times daily as needed, mild agitation                                     OR  --Haldol injection 5 mg, IM, 3 times daily as needed, moderate agitation --Benadryl injection 50 mg, IM, 3 times daily as needed, moderate  agitation --Ativan injection 2 mg, IM, 3 times daily as needed, moderate  agitation                                     OR --Haldol injection 10 mg, IM, 3 times daily as needed, severe agitation --Benadryl injection 50 mg, IM, 3 times daily as needed, severe  agitation --Ativan injection 2 mg, IM, 3 times daily as needed, severe agitation     -- Encouraged patient to participate in unit milieu and in scheduled  group therapies  -- Short Term Goals: Ability to identify changes in lifestyle to reduce recurrence of condition will improve, Ability to verbalize feelings will improve, Ability to  disclose and discuss suicidal ideas, Ability to demonstrate self-control will improve, Ability to identify and develop effective coping behaviors will improve, Ability to maintain clinical measurements within normal limits will improve, Compliance with prescribed medications will improve, and Ability to identify triggers associated with substance abuse/mental health issues will improve -- Long Term Goals: Improvement in symptoms so as ready for discharge                3. Medical Issues Being Addressed:               #  COPD -- Continue albuterol 108 MCG/ACT inhaler 1-2 puff, inhalation, every 6 hours as needed  #GERD -- Continue Protonix EC 40 mg, oral, Daily  #Tobacco Use Disorder             -- Continue Nicorette gum 2 mg as needed  -- Continue nicotine patch 14 mg transdermal, daily             -- Smoking cessation encouraged    #Continue As Needed Medications --Tylenol 650 mg every 6 hours, as needed, mild pain, fever --Maalox/Mylanta 30 mL oral every 4 hours as needed, indigestion --Milk of Magnesia 30 mL oral daily as needed, mild constipation   4. Labs    CMP: BUN 5?, otherwise within defined range CBC: MCV 76.7?, MCH 24.2?, RDW 16.7^, otherwise within defined range TSH : Within range Hemoglobin A1c: Within range Lipid Panel: Triglycerides 154^ Ethanol: 11^   UDS: + Buprenorphine, Cocaine    EKG: Normal sinus rhythm  QT/QTcB 392/429   5. Discharge Planning:              -- Social work and case management to assist with discharge planning and identification of hospital follow-up needs prior to discharge             -- Estimated LOS: 5-7 days             -- Discharge Concerns: Need to establish a safety plan; Medication compliance and effectiveness             -- Discharge Goals: Return home with outpatient referrals for mental health follow-up including medication management/psychotherapy    I certify that inpatient services furnished can reasonably be expected to  improve the patient's condition.   I certify that inpatient services furnished can reasonably be expected to improve the patient's condition.    Laurence Pons, FNP 4/12/202511:56 AM Patient ID: Rachell Budge, male   DOB: Aug 29, 1985, 38 y.o.   MRN: [id number]

## 2023-09-20 NOTE — Plan of Care (Signed)
  Problem: Education: Goal: Knowledge of [facility] Education information/materials will improve Outcome: Progressing Goal: Emotional status will improve Outcome: Progressing   Problem: Activity: Goal: Interest or engagement in activities will improve Outcome: Progressing   Problem: Coping: Goal: Ability to verbalize frustrations and anger appropriately will improve Outcome: Progressing   Problem: Health Behavior/Discharge Planning: Goal: Identification of resources available to assist in meeting health care needs will improve Outcome: Progressing   Problem: Physical Regulation: Goal: Ability to maintain clinical measurements within normal limits will improve Outcome: Progressing   Problem: Safety: Goal: Periods of time without injury will increase Outcome: Progressing

## 2023-09-21 DIAGNOSIS — F1910 Other psychoactive substance abuse, uncomplicated: Secondary | ICD-10-CM | POA: Diagnosis not present

## 2023-09-21 MED ORDER — PANTOPRAZOLE SODIUM 40 MG PO TBEC: 40.0000 mg | DELAYED_RELEASE_TABLET | Freq: Every day | ORAL | 0 refills | Status: DC

## 2023-09-21 MED ORDER — BUPRENORPHINE HCL-NALOXONE HCL 8-2 MG SL SUBL: 1.0000 | SUBLINGUAL_TABLET | Freq: Two times a day (BID) | SUBLINGUAL | 0 refills | Status: AC

## 2023-09-21 MED ORDER — NICOTINE POLACRILEX 2 MG MT GUM: 2.0000 mg | CHEWING_GUM | OROMUCOSAL | 0 refills | Status: DC | PRN

## 2023-09-21 MED ORDER — TRAZODONE HCL 50 MG PO TABS: 50.0000 mg | ORAL_TABLET | Freq: Every evening | ORAL | 0 refills | Status: DC | PRN

## 2023-09-21 MED ORDER — NICOTINE 14 MG/24HR TD PT24: 14.0000 mg | MEDICATED_PATCH | Freq: Every day | TRANSDERMAL | 0 refills | Status: DC

## 2023-09-21 NOTE — Group Note (Signed)
 Date:  09/21/2023 Time:  8:46 AM  Group Topic/Focus:  Goals Group:   The focus of this group is to help patients establish daily goals to achieve during treatment and discuss how the patient can incorporate goal setting into their daily lives to aide in recovery. Orientation:   The focus of this group is to educate the patient on the purpose and policies of crisis stabilization and provide a format to answer questions about their admission.  The group details unit policies and expectations of patients while admitted.    Participation Level:  Minimal  Participation Quality:  Appropriate  Affect:  Appropriate  Cognitive:  Appropriate  Insight: Appropriate  Engagement in Group:  Limited  Modes of Intervention:  Discussion and Orientation  Additional Comments:    Violette Grief 09/21/2023, 8:46 AM

## 2023-09-21 NOTE — Progress Notes (Signed)
  [facility] Adult Case Management Discharge Plan :  Will you be returning to the same living situation after discharge:  Yes,  The patient is going home to live with his father At discharge, do you have transportation home?: Yes,  The patient's brother will be pick him up Do you have the ability to pay for your medications: Yes,  The patient stated that he has insurance  Release of information consent forms completed and in the chart;  Patient's signature needed at discharge.  Patient to Follow up at:  Follow-up Information     [facility] [employer]. Go on 09/23/2023.   Why: Please go to this provider on 09/23/23 at 9:00 am for an assessment, to obtain therapy services.  You may also go on Monday through Friday, from 9 am to 1 pm. Contact information: [address] [place] [address] [phone]         [facility]. Go on 10/21/2023.   Specialty: Behavioral Health Why: You have an appointment for medication management services on 10/21/23 at 1 pm, in person. Contact information: [address] [place]  [address] [phone]                Next level of care provider has access to [facility] Link:no  Safety Planning and Suicide Prevention discussed: Yes,  CSW spoke to client at assessment due to client decline family.      Has patient been referred to the Quitline?: Patient refused referral for treatment  Patient has been referred for addiction treatment: Patient refused referral for treatment.  Tana Trefry O Auna Mikkelsen, LCSWA 09/21/2023, 2:17 PM

## 2023-09-21 NOTE — BH Assessment (Signed)
 Patient pleasant and cooperative, denies any complaints. When ask about anxiety and depression, pt states "No, none of that, I came here to detox, I signed a 72hr hold paper and should be going home." I have to go to the police station and get an ankle bracelet, so I have to get out of here tomorrow. I just got out of jail". Pt friendly and smiling. Denies SI/HI, and AVH, will continue 15 min checks

## 2023-09-21 NOTE — Progress Notes (Signed)
 Patient's brother confirmed for transportation. Patient acknowledged all discharged information provided. Patient appears in pleasant and happy mood, denies SI/HI. All belonging accounted for.

## 2023-09-21 NOTE — Discharge Summary (Signed)
 Physician Discharge Summary Note  Patient:  Robert Eaton is an 38 y.o., male MRN:  [id number] DOB:  04/29/1986 Patient phone:  [phone] (home)  Patient address:   [address] [place] [address],   Total Time spent with patient: 45 minutes  Date of Admission:  09/18/2023 Date of Discharge:   09/21/2023  Reason for Admission:   Robert Eaton, 38 year old male voluntarily presented to the [facility]al [facility] Ur[facility] center on September 18, 2023, seeking treatment for alcohol and cocaine use.  He has a documented psychiatric history of opioid use disorder and reported taking Suboxone daily.  A PHQ-9 screening was completed, yielding a total score of 7, consistent with mild depression.  The patient was subsequently admitted to the [facility] later that day for substance use treatment and medication management.   Principal Problem: Polysubstance abuse [facility]) Discharge Diagnoses: Principal Problem:   Polysubstance abuse (HCC) Active Problems:   COPD (chronic obstructive pulmonary disease) (HCC)   Alcohol use disorder   Cocaine abuse (HCC)   Opioid use disorder   GERD (gastroesophageal reflux disease)   Tobacco use disorder  Past Psychiatric History: Polysubstance abuse, Alcohol use  - Denies any past psychiatric diagnoses. - Denies any history of psychiatric medications. - Denies any history of psychotherapy or psychiatric hospitalizations. - Reports participation in the [facility] Stop MAT program since January for opioid abuse. - Reports a history of substance detox treatment. - Denies any history of self-harm or suicidal attempts. - Denies any history of trauma. - Denies symptoms of mania, PTSD, psychosis, anxiety, or depression, except occasional disturbed sleep attributed to work schedule.  Past Medical History:  Past Medical History:  Diagnosis Date   COPD (chronic obstructive pulmonary disease) (HCC)    Irregular heartbeat    History reviewed. No pertinent  surgical history. Family History: History reviewed. No pertinent family history. Family Psychiatric  History: See H&P Social History:  Social History   Substance and Sexual Activity  Alcohol Use Yes   Comment: several beers per day     Social History   Substance and Sexual Activity  Drug Use Yes   Types: Marijuana    Social History   Socioeconomic History   Marital status: Single    Spouse name: Not on file   Number of children: Not on file   Years of education: Not on file   Highest education level: Not on file  Occupational History   Not on file  Tobacco Use   Smoking status: Every Day    Current packs/day: 1.50    Types: Cigarettes   Smokeless tobacco: Never  Substance and Sexual Activity   Alcohol use: Yes    Comment: several beers per day   Drug use: Yes    Types: Marijuana   Sexual activity: Not on file  Other Topics Concern   Not on file  Social History Narrative   Not on file   Social Drivers of Health   Financial Resource Strain: Not on file  Food Insecurity: No Food Insecurity (09/18/2023)   Hunger Vital Sign    Worried About Running Out of Food in the Last Year: Never true    Ran Out of Food in the Last Year: Never true  Transportation Needs: No Transportation Needs (09/18/2023)   PRAPARE - Administrator, Civil Service (Medical): No    Lack of Transportation (Non-Medical): No  Physical Activity: Not on file  Stress: Not on file  Social Connections: Not on file  Hospital Course: During the patient's hospitalization, patient had extensive initial psychiatric evaluation, and follow-up psychiatric evaluations every day.  Psychiatric diagnoses provided upon initial assessment:  Diagnosis:  Principal Problem:   Polysubstance abuse (HCC) Active Problems:    Alcohol use disorder   Cocaine abuse (HCC)   Opioid use disorder   Tobacco use disorder  Patient's psychiatric medications were adjusted on admission:  - Resume Suboxone 8-2 mg  per SL tablet, 1 tablet, sublingual, 2 times daily -- Continue CIWA assessment -- Continue hydroxyzine 25 mg, oral, every 6 hours as needed, anxiety, or CIWA score </=10  --Trazodone 50 mg, oral, daily at bedtime as needed, sleep --Continue BH Agitation Protocol  During the hospitalization, other adjustments were made to the patient's psychiatric medication regimen: None  Patient's care was discussed during the interdisciplinary team meeting every day during the hospitalization.  The patient denies having side effects to prescribed psychiatric medication.  Gradually, patient started adjusting to milieu. The patient was evaluated each day by a clinical provider to ascertain response to treatment. Improvement was noted by the patient's report of decreasing symptoms, improved sleep and appetite, affect, medication tolerance, behavior, and participation in unit programming.  Patient was asked each day to complete a self inventory noting mood, mental status, pain, new symptoms, anxiety and concerns.    Symptoms were reported as significantly decreased or resolved completely by discharge.   On day of discharge, the patient reports that their mood is stable. The patient denied having suicidal thoughts for more than 48 hours prior to discharge.  Patient denies having homicidal thoughts.  Patient denies having auditory hallucinations.  Patient denies any visual hallucinations or other symptoms of psychosis. The patient was motivated to continue taking medication with a goal of continued improvement in mental health.   The patient reports their target psychiatric symptoms of mood disorder responded well to the psychiatric medications, and the patient reports overall benefit other psychiatric hospitalization. Supportive psychotherapy was provided to the patient. The patient also participated in regular group therapy while hospitalized. Coping skills, problem solving as well as relaxation therapies were also  part of the unit programming.  Labs were reviewed with the patient, and abnormal results were discussed with the patient.  The patient is able to verbalize their individual safety plan to this provider.  # It is recommended to the patient to continue psychiatric medications as prescribed, after discharge from the hospital.    # It is recommended to the patient to follow up with your outpatient psychiatric provider and PCP.  # It was discussed with the patient, the impact of alcohol, drugs, tobacco have been there overall psychiatric and medical wellbeing, and total abstinence from substance use was recommended the patient.ed.  # Prescriptions provided or sent directly to preferred pharmacy at discharge. Patient agreeable to plan. Given opportunity to ask questions. Appears to feel comfortable with discharge.    # In the event of worsening symptoms, the patient is instructed to call the crisis hotline, 911 and or go to the nearest ED for appropriate evaluation and treatment of symptoms. To follow-up with primary care provider for other medical issues, concerns and or health care needs  # Patient was discharged home with a plan to follow up as noted below.  Physical Findings: AIMS:  , ,  ,  ,    CIWA:  CIWA-Ar Total: 0 COWS:     Musculoskeletal: Strength & Muscle Tone: within normal limits Gait & Station: normal Patient leans: N/A  Psychiatric  Specialty Exam:  Presentation  General Appearance:  Appropriate for Environment; Casual; Fairly Groomed  Eye Contact: Good  Speech: Clear and Coherent; Normal Rate  Speech Volume: Normal  Handedness: Right  Mood and Affect  Mood: Euthymic  Affect: Appropriate; Congruent  Thought Process  Thought Processes: Coherent; Goal Directed  Descriptions of Associations:Intact  Orientation:Full (Time, Place and Person)  Thought Content:Logical  History of Schizophrenia/Schizoaffective disorder:No  Duration of Psychotic  Symptoms:No data recorded Hallucinations:Hallucinations: None  Ideas of Reference:None  Suicidal Thoughts:Suicidal Thoughts: No  Homicidal Thoughts:Homicidal Thoughts: No  Sensorium  Memory: Immediate Good; Recent Good  Judgment: Fair  Insight: Fair  Art therapist  Concentration: Good  Attention Span: Good  Recall: Fair  Fund of Knowledge: Fair  Language: Good  Psychomotor Activity  Psychomotor Activity: Psychomotor Activity: Normal  Assets  Assets: Communication Skills; Desire for Improvement; Physical Health; Resilience  Sleep  Sleep: Sleep: Good Number of Hours of Sleep: 5.75  Physical Exam: Physical Exam Vitals and nursing note reviewed.  Constitutional:      General: He is not in acute distress.    Appearance: He is not toxic-appearing.  HENT:     Head: Normocephalic.     Right Ear: External ear normal.     Left Ear: External ear normal.     Nose: Nose normal.     Mouth/Throat:     Mouth: Mucous membranes are moist.     Pharynx: Oropharynx is clear.  Eyes:     Extraocular Movements: Extraocular movements intact.  Cardiovascular:     Rate and Rhythm: Normal rate.     Pulses: Normal pulses.  Pulmonary:     Effort: Pulmonary effort is normal.  Abdominal:     Comments: Deferred  Genitourinary:    Comments: Deferred Musculoskeletal:        General: Normal range of motion.     Cervical back: Normal range of motion.  Skin:    General: Skin is warm.  Neurological:     General: No focal deficit present.     Mental Status: He is alert and oriented to person, place, and time.     Motor: No weakness.     Gait: Gait normal.  Psychiatric:        Mood and Affect: Mood normal.        Behavior: Behavior normal.        Thought Content: Thought content normal.    Review of Systems  Constitutional:  Negative for chills and fever.  HENT:  Negative for sore throat.   Eyes:  Negative for blurred vision.  Respiratory:  Negative for  cough, sputum production, shortness of breath and wheezing.   Cardiovascular:  Negative for chest pain and palpitations.  Gastrointestinal:  Negative for abdominal pain, constipation, diarrhea, heartburn, nausea and vomiting.  Genitourinary:  Negative for dysuria, frequency and urgency.  Musculoskeletal:  Negative for myalgias.  Skin:  Negative for itching and rash.  Neurological:  Negative for dizziness and headaches.  Endo/Heme/Allergies:        See allergy Listing  Psychiatric/Behavioral:  Negative for depression, hallucinations, memory loss, substance abuse and suicidal ideas. The patient is not nervous/anxious and does not have insomnia.    Blood pressure 112/70, pulse 86, temperature 98.4 F (36.9 C), temperature source Oral, resp. rate 16, height 5\' 9"  (1.753 m), weight 88 kg, SpO2 97%. Body mass index is 28.65 kg/m.  Social History   Tobacco Use  Smoking Status Every Day   Current packs/day: 1.50  Types: Cigarettes  Smokeless Tobacco Never   Tobacco Cessation:  A prescription for an FDA-approved tobacco cessation medication provided at discharge  Blood Alcohol level:  Lab Results  Component Value Date   ETH 11 (H) 09/18/2023   ETH <10 12/09/2020   Metabolic Disorder Labs:  Lab Results  Component Value Date   HGBA1C 5.6 09/18/2023   MPG 114.02 09/18/2023   No results found for: "PROLACTIN" Lab Results  Component Value Date   CHOL 156 09/18/2023   TRIG 154 (H) 09/18/2023   HDL 54 09/18/2023   CHOLHDL 2.9 09/18/2023   VLDL 31 09/18/2023   LDLCALC 71 09/18/2023   See Psychiatric Specialty Exam and Suicide Risk Assessment completed by Attending Physician prior to discharge.  Discharge destination:  Home Is patient on multiple antipsychotic therapies at discharge:  No   Has Patient had three or more failed trials of antipsychotic monotherapy by history:  No  Recommended Plan for Multiple Antipsychotic Therapies: NA  Discharge Instructions     Increase  activity slowly   Complete by: As directed       Allergies as of 09/21/2023   No Known Allergies      Medication List     STOP taking these medications    omeprazole 20 MG capsule Commonly known as: PRILOSEC Replaced by: pantoprazole 40 MG tablet       TAKE these medications      Indication  albuterol 108 (90 Base) MCG/ACT inhaler Commonly known as: VENTOLIN HFA Inhale 1-2 puffs into the lungs every 6 (six) hours as needed for wheezing or shortness of breath.  Indication: Asthma   buprenorphine-naloxone 8-2 mg Subl SL tablet Commonly known as: SUBOXONE Place 1 tablet under the tongue 2 (two) times daily for 3 days. What changed: when to take this  Indication: Opioid Dependence   nicotine 14 mg/24hr patch Commonly known as: NICODERM CQ - dosed in mg/24 hours Place 1 patch (14 mg total) onto the skin daily. Start taking on: September 22, 2023  Indication: Nicotine Addiction   nicotine polacrilex 2 MG gum Commonly known as: NICORETTE Take 1 each (2 mg total) by mouth as needed for smoking cessation.  Indication: Nicotine Addiction   pantoprazole 40 MG tablet Commonly known as: PROTONIX Take 1 tablet (40 mg total) by mouth daily. Start taking on: September 22, 2023 Replaces: omeprazole 20 MG capsule  Indication: Gastroesophageal Reflux Disease   traZODone 50 MG tablet Commonly known as: DESYREL Take 1 tablet (50 mg total) by mouth at bedtime as needed for sleep.  Indication: Trouble Sleeping        Follow-up Information     [facility] [employer]. Go on 09/23/2023.   Why: Please go to this provider on 09/23/23 at 9:00 am for an assessment, to obtain therapy services.  You may also go on Monday through Friday, from 9 am to 1 pm. Contact information: [address] [place] [address] [phone]         [facility]. Go on 10/21/2023.   Specialty: Behavioral Health Why: You have an appointment for medication  management services on 10/21/23 at 1 pm, in person. Contact information: [address]   [phone]               Follow-up recommendations:   Discharge Recommendations:   The patient is being discharged to home.  Patient is to take his discharge medications as ordered. ?See follow up above.  We recommend that he participates in individual therapy to target uncontrollable agitation and substance abuse.   We recommend that he participates in therapy to target personal conflict, to improve communication skills and conflict resolution skills. Patient is to initiate/implement a contingency based behavioral model to address his behavior.  We recommend that he gets AIMS scale, height, weight, blood pressure, fasting lipid panel, fasting blood sugar in three months from discharge if he's on atypical antipsychotics.   Patient will benefit from monitoring of recurrent suicidal ideation since patient is on antidepressant medication.  The patient should abstain from all illicit substances and alcohol.  If the patient's symptoms worsen or do not continue to improve or if the patient becomes actively suicidal or homicidal then it is recommended that the patient return to the closest hospital emergency room or call 911 for further evaluation and treatment. National Suicide Prevention Lifeline 1800-SUICIDE or (956) 744-7724.  Please follow up with your primary medical doctor for all other medical needs.   The patient has been educated on the possible side effects to medications and she/her guardian is to contact a medical professional and inform outpatient provider of any new side effects of medication.  He is to take regular diet and activity as tolerated. ?Will benefit from moderate daily exercise.  Patient and Family was educated about removing/locking any firearms, medications or dangerous products from the home.   Activity:  As tolerated  Diet:  Regular Diet   Signed:  Laurence Pons, FNP 09/21/2023, 1:03 PM

## 2023-09-21 NOTE — [facility] Suicide Risk Assessment (Signed)
 Suicide Risk Assessment  Discharge Assessment    [facility] Discharge Suicide Risk Assessment   Principal Problem: Polysubstance abuse [facility]) Discharge Diagnoses: Principal Problem:   Polysubstance abuse (HCC) Active Problems:   COPD (chronic obstructive pulmonary disease) (HCC)   Alcohol use disorder   Cocaine abuse (HCC)   Opioid use disorder   GERD (gastroesophageal reflux disease)   Tobacco use disorder  Reason for Admission:  Robert Eaton, 38 year old male [facility], [facility], seeking treatment for alcohol and cocaine use.  He has a documented psychiatric history of opioid use disorder and reported taking Suboxone daily.  A PHQ-9 screening was completed, yielding a total score of 7, consistent with mild depression.  The patient was subsequently admitted to the [facility] later that day for substance use treatment and medication management.   Total Time spent with patient: 45 minutes  Musculoskeletal: Strength & Muscle Tone: within normal limits Gait & Station: normal Patient leans: N/A  Psychiatric Specialty Exam  Presentation  General Appearance:  Appropriate for Environment; Casual  Eye Contact: Good  Speech: Clear and Coherent; Normal Rate  Speech Volume: Normal  Handedness: Right  Mood and Affect  Mood: Euthymic  Duration of Depression Symptoms: No data recorded Affect: Appropriate; Congruent  Thought Process  Thought Processes: Coherent  Descriptions of Associations:Intact  Orientation:Full (Time, Place and Person)  Thought Content:Logical  History of Schizophrenia/Schizoaffective disorder:No  Duration of Psychotic Symptoms:No data recorded Hallucinations:Hallucinations: None  Ideas of Reference:None  Suicidal Thoughts:Suicidal Thoughts: No  Homicidal Thoughts:Homicidal Thoughts: No  Sensorium  Memory: Immediate Good; Recent  Good  Judgment: Fair  Insight: Fair  Art therapist  Concentration: Good  Attention Span: Good  Recall: Good  Fund of Knowledge: Fair  Language: Good  Psychomotor Activity  Psychomotor Activity: Psychomotor Activity: Normal  Assets  Assets: Communication Skills; Physical Health; Resilience  Sleep  Sleep: Sleep: Good Number of Hours of Sleep: 7.5  Physical Exam: Physical Exam Vitals and nursing note reviewed.  Constitutional:      Appearance: He is normal weight.  HENT:     Head: Normocephalic.     Right Ear: External ear normal.     Left Ear: External ear normal.     Nose: Nose normal. No congestion.     Mouth/Throat:     Mouth: Mucous membranes are moist.     Pharynx: Oropharynx is clear.  Eyes:     Extraocular Movements: Extraocular movements intact.  Cardiovascular:     Rate and Rhythm: Normal rate.     Pulses: Normal pulses.  Pulmonary:     Effort: Pulmonary effort is normal.  Abdominal:     Comments: Deferred  Genitourinary:    Comments: Deferred  Musculoskeletal:        General: Normal range of motion.     Cervical back: Normal range of motion.  Skin:    General: Skin is warm.  Neurological:     General: No focal deficit present.     Mental Status: He is alert and oriented to person, place, and time.  Psychiatric:        Mood and Affect: Mood normal.        Behavior: Behavior normal.        Thought Content: Thought content normal.   Review of Systems  Constitutional:  Negative for chills and fever.  HENT:  Negative for sore throat.   Eyes:  Negative for blurred vision.  Respiratory:  Negative for  cough, sputum production, shortness of breath and wheezing.   Cardiovascular:  Negative for chest pain and palpitations.  Gastrointestinal:  Negative for abdominal pain, constipation, diarrhea, heartburn, nausea and vomiting.  Genitourinary:  Negative for dysuria.  Musculoskeletal:  Negative for myalgias.  Skin:  Negative for  itching and rash.  Neurological:  Negative for dizziness, seizures and headaches.  Endo/Heme/Allergies:        See allergy listing  Psychiatric/Behavioral:  Negative for depression, hallucinations, memory loss, substance abuse and suicidal ideas. The patient is nervous/anxious. The patient does not have insomnia.    Blood pressure 112/70, pulse 86, temperature 98.4 F (36.9 C), temperature source Oral, resp. rate 16, height 5\' 9"  (1.753 m), weight 88 kg, SpO2 97%. Body mass index is 28.65 kg/m.  Mental Status Per Nursing Assessment::   On Admission:  NA  Demographic Factors:  Caucasian, Low socioeconomic status, and Unemployed  Loss Factors: Financial problems/change in socioeconomic status  Historical Factors: NA  Risk Reduction Factors:   Positive social support, Positive therapeutic relationship, and Positive coping skills or problem solving skills  Continued Clinical Symptoms:  Alcohol/Substance Abuse/Dependencies Previous Psychiatric Diagnoses and Treatments Medical Diagnoses and Treatments/Surgeries  Cognitive Features That Contribute To Risk:  Polarized thinking    Suicide Risk:  Mild:  Suicidal ideation of limited frequency, intensity, duration, and specificity.  There are no identifiable plans, no associated intent, mild dysphoria and related symptoms, good self-control (both objective and subjective assessment), few other risk factors, and identifiable protective factors, including available and accessible social support.   Follow-up Information     [facility]. Go on 09/23/2023.   Why: Please go to this provider on 09/23/23 at 9:00 am for an assessment, to obtain therapy services.  You may also go on Monday through Friday, from 9 am to 1 pm. Contact information: [address] [place] [address] [phone]         [facility]. Go on 10/21/2023.   Specialty: Behavioral Health Why: You have an appointment  for medication management services on 10/21/23 at 1 pm, in person. Contact information: [address] [place]  [address] [phone]               Plan Of Care/Follow-up recommendations:  Discharge Recommendations:   The patient is being discharged to home.  Patient is to take his discharge medications as ordered. ?See follow up above.  We recommend that he participates in individual therapy to target uncontrollable agitation and substance abuse.   We recommend that he participates in therapy to target personal conflict, to improve communication skills and conflict resolution skills. Patient is to initiate/implement a contingency based behavioral model to address his behavior.  We recommend that he gets AIMS scale, height, weight, blood pressure, fasting lipid panel, fasting blood sugar in three months from discharge if he's on atypical antipsychotics.   Patient will benefit from monitoring of recurrent suicidal ideation since patient is on antidepressant medication.  The patient should abstain from all illicit substances and alcohol.  If the patient's symptoms worsen or do not continue to improve or if the patient becomes actively suicidal or homicidal then it is recommended that the patient return to the closest hospital emergency room or call 911 for further evaluation and treatment. National Suicide Prevention Lifeline 1800-SUICIDE or (340)719-0736.  Please follow up with your primary medical doctor for all other medical needs.   The patient has been educated on the possible side effects  to medications and she/her guardian is to contact a medical professional and inform outpatient provider of any new side effects of medication.  He is to take regular diet and activity as tolerated. ?Will benefit from moderate daily exercise.  Patient and Family was educated about removing/locking any firearms, medications or dangerous products from the home.  Activity:  As tolerated  Diet:   Regular Diet   Laurence Pons, FNP  09/21/2023, 12:24 PM

## 2023-09-21 NOTE — Progress Notes (Signed)
   09/21/23 0900  Psychosocial Assessment  Patient Complaints None  Eye Contact Fair  Facial Expression Animated  Affect Appropriate to circumstance  Speech Logical/coherent  Interaction Assertive  Motor Activity Other (Comment) (WDL)  Appearance/Hygiene Unremarkable  Behavior Characteristics Cooperative  Mood Anxious  Thought Process  Coherency WDL  Content WDL  Delusions None reported or observed  Perception WDL  Hallucination None reported or observed  Judgment WDL  Confusion WDL  Danger to Self  Current suicidal ideation? Denies  Agreement Not to Harm Self Yes  Description of Agreement verbal

## 2023-09-21 NOTE — Plan of Care (Signed)
  Problem: Education: Goal: Mental status will improve Outcome: Progressing   Problem: Activity: Goal: Interest or engagement in activities will improve Outcome: Progressing   Problem: Safety: Goal: Periods of time without injury will increase Outcome: Progressing

## 2023-10-20 DIAGNOSIS — Z419 Encounter for procedure for purposes other than remedying health state, unspecified: Secondary | ICD-10-CM | POA: Diagnosis not present

## 2023-10-21 ENCOUNTER — Ambulatory Visit (HOSPITAL_COMMUNITY): Admitting: Psychiatry

## 2023-11-20 DIAGNOSIS — Z419 Encounter for procedure for purposes other than remedying health state, unspecified: Secondary | ICD-10-CM | POA: Diagnosis not present

## 2023-12-20 DIAGNOSIS — Z419 Encounter for procedure for purposes other than remedying health state, unspecified: Secondary | ICD-10-CM | POA: Diagnosis not present

## 2024-01-07 ENCOUNTER — Emergency Department (HOSPITAL_COMMUNITY)

## 2024-01-07 ENCOUNTER — Other Ambulatory Visit: Payer: Self-pay

## 2024-01-07 ENCOUNTER — Inpatient Hospital Stay (HOSPITAL_COMMUNITY)
Admission: EM | Admit: 2024-01-07 | Discharge: 2024-01-26 | DRG: 853 | Attending: Internal Medicine | Admitting: Internal Medicine

## 2024-01-07 ENCOUNTER — Encounter (HOSPITAL_COMMUNITY): Payer: Self-pay | Admitting: Emergency Medicine

## 2024-01-07 DIAGNOSIS — M729 Fibroblastic disorder, unspecified: Secondary | ICD-10-CM | POA: Diagnosis not present

## 2024-01-07 DIAGNOSIS — F1090 Alcohol use, unspecified, uncomplicated: Secondary | ICD-10-CM | POA: Diagnosis not present

## 2024-01-07 DIAGNOSIS — R233 Spontaneous ecchymoses: Secondary | ICD-10-CM | POA: Diagnosis present

## 2024-01-07 DIAGNOSIS — B159 Hepatitis A without hepatic coma: Secondary | ICD-10-CM | POA: Diagnosis present

## 2024-01-07 DIAGNOSIS — F1010 Alcohol abuse, uncomplicated: Secondary | ICD-10-CM | POA: Diagnosis present

## 2024-01-07 DIAGNOSIS — W3400XD Accidental discharge from unspecified firearms or gun, subsequent encounter: Secondary | ICD-10-CM | POA: Diagnosis not present

## 2024-01-07 DIAGNOSIS — F1910 Other psychoactive substance abuse, uncomplicated: Secondary | ICD-10-CM | POA: Diagnosis present

## 2024-01-07 DIAGNOSIS — Z803 Family history of malignant neoplasm of breast: Secondary | ICD-10-CM

## 2024-01-07 DIAGNOSIS — K8080 Other cholelithiasis without obstruction: Secondary | ICD-10-CM

## 2024-01-07 DIAGNOSIS — R7989 Other specified abnormal findings of blood chemistry: Secondary | ICD-10-CM | POA: Diagnosis not present

## 2024-01-07 DIAGNOSIS — K7010 Alcoholic hepatitis without ascites: Secondary | ICD-10-CM | POA: Diagnosis present

## 2024-01-07 DIAGNOSIS — B951 Streptococcus, group B, as the cause of diseases classified elsewhere: Secondary | ICD-10-CM | POA: Diagnosis not present

## 2024-01-07 DIAGNOSIS — E43 Unspecified severe protein-calorie malnutrition: Secondary | ICD-10-CM | POA: Diagnosis present

## 2024-01-07 DIAGNOSIS — M726 Necrotizing fasciitis: Secondary | ICD-10-CM | POA: Diagnosis present

## 2024-01-07 DIAGNOSIS — A419 Sepsis, unspecified organism: Secondary | ICD-10-CM | POA: Diagnosis present

## 2024-01-07 DIAGNOSIS — M7989 Other specified soft tissue disorders: Secondary | ICD-10-CM | POA: Diagnosis present

## 2024-01-07 DIAGNOSIS — K219 Gastro-esophageal reflux disease without esophagitis: Secondary | ICD-10-CM | POA: Diagnosis present

## 2024-01-07 DIAGNOSIS — E871 Hypo-osmolality and hyponatremia: Secondary | ICD-10-CM | POA: Diagnosis present

## 2024-01-07 DIAGNOSIS — F1110 Opioid abuse, uncomplicated: Secondary | ICD-10-CM | POA: Diagnosis present

## 2024-01-07 DIAGNOSIS — D75838 Other thrombocytosis: Secondary | ICD-10-CM | POA: Diagnosis present

## 2024-01-07 DIAGNOSIS — R600 Localized edema: Secondary | ICD-10-CM | POA: Diagnosis present

## 2024-01-07 DIAGNOSIS — R17 Unspecified jaundice: Secondary | ICD-10-CM | POA: Diagnosis not present

## 2024-01-07 DIAGNOSIS — K760 Fatty (change of) liver, not elsewhere classified: Secondary | ICD-10-CM | POA: Diagnosis not present

## 2024-01-07 DIAGNOSIS — B950 Streptococcus, group A, as the cause of diseases classified elsewhere: Secondary | ICD-10-CM

## 2024-01-07 DIAGNOSIS — Z419 Encounter for procedure for purposes other than remedying health state, unspecified: Secondary | ICD-10-CM | POA: Diagnosis not present

## 2024-01-07 DIAGNOSIS — D72829 Elevated white blood cell count, unspecified: Secondary | ICD-10-CM | POA: Diagnosis not present

## 2024-01-07 DIAGNOSIS — L02415 Cutaneous abscess of right lower limb: Secondary | ICD-10-CM | POA: Diagnosis present

## 2024-01-07 DIAGNOSIS — E869 Volume depletion, unspecified: Secondary | ICD-10-CM | POA: Diagnosis present

## 2024-01-07 DIAGNOSIS — D509 Iron deficiency anemia, unspecified: Secondary | ICD-10-CM | POA: Diagnosis present

## 2024-01-07 DIAGNOSIS — D65 Disseminated intravascular coagulation [defibrination syndrome]: Secondary | ICD-10-CM | POA: Diagnosis present

## 2024-01-07 DIAGNOSIS — E872 Acidosis, unspecified: Secondary | ICD-10-CM | POA: Diagnosis present

## 2024-01-07 DIAGNOSIS — Z792 Long term (current) use of antibiotics: Secondary | ICD-10-CM | POA: Diagnosis not present

## 2024-01-07 DIAGNOSIS — D539 Nutritional anemia, unspecified: Secondary | ICD-10-CM | POA: Diagnosis not present

## 2024-01-07 DIAGNOSIS — M65061 Abscess of tendon sheath, right lower leg: Secondary | ICD-10-CM | POA: Diagnosis not present

## 2024-01-07 DIAGNOSIS — F411 Generalized anxiety disorder: Secondary | ICD-10-CM | POA: Diagnosis present

## 2024-01-07 DIAGNOSIS — M60003 Infective myositis, unspecified right leg: Secondary | ICD-10-CM | POA: Diagnosis present

## 2024-01-07 DIAGNOSIS — J449 Chronic obstructive pulmonary disease, unspecified: Secondary | ICD-10-CM | POA: Diagnosis present

## 2024-01-07 DIAGNOSIS — L03115 Cellulitis of right lower limb: Secondary | ICD-10-CM | POA: Diagnosis present

## 2024-01-07 DIAGNOSIS — F1190 Opioid use, unspecified, uncomplicated: Secondary | ICD-10-CM | POA: Diagnosis not present

## 2024-01-07 DIAGNOSIS — Z79899 Other long term (current) drug therapy: Secondary | ICD-10-CM | POA: Diagnosis not present

## 2024-01-07 DIAGNOSIS — F329 Major depressive disorder, single episode, unspecified: Secondary | ICD-10-CM | POA: Diagnosis present

## 2024-01-07 DIAGNOSIS — R590 Localized enlarged lymph nodes: Secondary | ICD-10-CM | POA: Diagnosis present

## 2024-01-07 DIAGNOSIS — I959 Hypotension, unspecified: Secondary | ICD-10-CM | POA: Diagnosis present

## 2024-01-07 DIAGNOSIS — Z23 Encounter for immunization: Secondary | ICD-10-CM | POA: Diagnosis not present

## 2024-01-07 DIAGNOSIS — Z6828 Body mass index (BMI) 28.0-28.9, adult: Secondary | ICD-10-CM

## 2024-01-07 DIAGNOSIS — S71131D Puncture wound without foreign body, right thigh, subsequent encounter: Secondary | ICD-10-CM

## 2024-01-07 DIAGNOSIS — A400 Sepsis due to streptococcus, group A: Secondary | ICD-10-CM | POA: Diagnosis not present

## 2024-01-07 DIAGNOSIS — Z801 Family history of malignant neoplasm of trachea, bronchus and lung: Secondary | ICD-10-CM

## 2024-01-07 DIAGNOSIS — Z79891 Long term (current) use of opiate analgesic: Secondary | ICD-10-CM | POA: Diagnosis not present

## 2024-01-07 DIAGNOSIS — L089 Local infection of the skin and subcutaneous tissue, unspecified: Secondary | ICD-10-CM | POA: Diagnosis not present

## 2024-01-07 DIAGNOSIS — D72823 Leukemoid reaction: Secondary | ICD-10-CM | POA: Diagnosis present

## 2024-01-07 DIAGNOSIS — E876 Hypokalemia: Secondary | ICD-10-CM | POA: Diagnosis present

## 2024-01-07 DIAGNOSIS — Z800 Family history of malignant neoplasm of digestive organs: Secondary | ICD-10-CM

## 2024-01-07 DIAGNOSIS — F17210 Nicotine dependence, cigarettes, uncomplicated: Secondary | ICD-10-CM | POA: Diagnosis present

## 2024-01-07 DIAGNOSIS — Z7982 Long term (current) use of aspirin: Secondary | ICD-10-CM | POA: Diagnosis not present

## 2024-01-07 DIAGNOSIS — M79661 Pain in right lower leg: Secondary | ICD-10-CM | POA: Diagnosis not present

## 2024-01-07 DIAGNOSIS — K8021 Calculus of gallbladder without cholecystitis with obstruction: Secondary | ICD-10-CM | POA: Diagnosis present

## 2024-01-07 LAB — NA AND K (SODIUM & POTASSIUM), RAND UR
Potassium Urine: 11 mmol/L
Sodium, Ur: <10 mmol/L

## 2024-01-07 LAB — CBC WITH DIFFERENTIAL/PLATELET
Abs Immature Granulocytes: 2.03 K/uL — ABNORMAL HIGH (ref 0.00–0.07)
Basophils Absolute: 0.3 K/uL — ABNORMAL HIGH (ref 0.0–0.1)
Basophils Relative: 1 %
Eosinophils Absolute: 0 K/uL (ref 0.0–0.5)
Eosinophils Relative: 0 %
HCT: 34.9 % — ABNORMAL LOW (ref 39.0–52.0)
Hemoglobin: 12.3 g/dL — ABNORMAL LOW (ref 13.0–17.0)
Immature Granulocytes: 4 %
Lymphocytes Relative: 3 %
Lymphs Abs: 1.7 K/uL (ref 0.7–4.0)
MCH: 25.2 pg — ABNORMAL LOW (ref 26.0–34.0)
MCHC: 35.2 g/dL (ref 30.0–36.0)
MCV: 71.5 fL — ABNORMAL LOW (ref 80.0–100.0)
Monocytes Absolute: 1.6 K/uL — ABNORMAL HIGH (ref 0.1–1.0)
Monocytes Relative: 3 %
Neutro Abs: 48.7 K/uL — ABNORMAL HIGH (ref 1.7–7.7)
Neutrophils Relative %: 89 %
Platelets: 262 K/uL (ref 150–400)
RBC: 4.88 MIL/uL (ref 4.22–5.81)
RDW: 15.8 % — ABNORMAL HIGH (ref 11.5–15.5)
WBC: 54.3 K/uL (ref 4.0–10.5)
nRBC: 0 % (ref 0.0–0.2)

## 2024-01-07 LAB — LIPASE, BLOOD: Lipase: 18 U/L (ref 11–51)

## 2024-01-07 LAB — COMPREHENSIVE METABOLIC PANEL WITH GFR
ALT: 140 U/L — ABNORMAL HIGH (ref 0–44)
AST: 153 U/L — ABNORMAL HIGH (ref 15–41)
Albumin: 1.9 g/dL — ABNORMAL LOW (ref 3.5–5.0)
Alkaline Phosphatase: 141 U/L — ABNORMAL HIGH (ref 38–126)
Anion gap: 12 (ref 5–15)
BUN: 11 mg/dL (ref 6–20)
CO2: 25 mmol/L (ref 22–32)
Calcium: 8.1 mg/dL — ABNORMAL LOW (ref 8.9–10.3)
Chloride: 89 mmol/L — ABNORMAL LOW (ref 98–111)
Creatinine, Ser: 0.94 mg/dL (ref 0.61–1.24)
GFR, Estimated: >60 mL/min (ref >60)
Glucose, Bld: 122 mg/dL — ABNORMAL HIGH (ref 70–99)
Potassium: 3.4 mmol/L — ABNORMAL LOW (ref 3.5–5.1)
Sodium: 126 mmol/L — ABNORMAL LOW (ref 135–145)
Total Bilirubin: 5.6 mg/dL — ABNORMAL HIGH (ref 0.0–1.2)
Total Protein: 6.4 g/dL — ABNORMAL LOW (ref 6.5–8.1)

## 2024-01-07 LAB — URINALYSIS, W/ REFLEX TO CULTURE (INFECTION SUSPECTED)
Bacteria, UA: NONE SEEN
Bilirubin Urine: NEGATIVE
Glucose, UA: NEGATIVE mg/dL
Hgb urine dipstick: NEGATIVE
Ketones, ur: NEGATIVE mg/dL
Leukocytes,Ua: NEGATIVE
Nitrite: NEGATIVE
Protein, ur: NEGATIVE mg/dL
Specific Gravity, Urine: 1.023 (ref 1.005–1.030)
pH: 6 (ref 5.0–8.0)

## 2024-01-07 LAB — BRAIN NATRIURETIC PEPTIDE: B Natriuretic Peptide: 96.9 pg/mL (ref 0.0–100.0)

## 2024-01-07 LAB — ACETAMINOPHEN LEVEL: Acetaminophen (Tylenol), Serum: 10 ug/mL (ref 10–30)

## 2024-01-07 LAB — TECHNOLOGIST SMEAR REVIEW: Plt Morphology: NORMAL

## 2024-01-07 LAB — I-STAT CG4 LACTIC ACID, ED
Lactic Acid, Venous: 4.3 mmol/L (ref 0.5–1.9)
Lactic Acid, Venous: 2.1 mmol/L (ref 0.5–1.9)

## 2024-01-07 LAB — RESP PANEL BY RT-PCR (RSV, FLU A&B, COVID)  RVPGX2
Influenza A by PCR: NEGATIVE
Influenza B by PCR: NEGATIVE
Resp Syncytial Virus by PCR: NEGATIVE
SARS Coronavirus 2 by RT PCR: NEGATIVE

## 2024-01-07 LAB — HEPATIC FUNCTION PANEL
ALT: 121 U/L — ABNORMAL HIGH (ref 0–44)
AST: 156 U/L — ABNORMAL HIGH (ref 15–41)
Albumin: 1.5 g/dL — ABNORMAL LOW (ref 3.5–5.0)
Alkaline Phosphatase: 123 U/L (ref 38–126)
Bilirubin, Direct: 2.8 mg/dL — ABNORMAL HIGH (ref 0.0–0.2)
Indirect Bilirubin: 2.8 mg/dL — ABNORMAL HIGH (ref 0.3–0.9)
Total Bilirubin: 5.6 mg/dL — ABNORMAL HIGH (ref 0.0–1.2)
Total Protein: 4.5 g/dL — ABNORMAL LOW (ref 6.5–8.1)

## 2024-01-07 LAB — HEPATITIS PANEL, ACUTE
HCV Ab: NONREACTIVE
Hep A IgM: NONREACTIVE
Hep B C IgM: NONREACTIVE
Hepatitis B Surface Ag: NONREACTIVE

## 2024-01-07 LAB — PROTIME-INR
INR: 1.4 — ABNORMAL HIGH (ref 0.8–1.2)
Prothrombin Time: 18 s — ABNORMAL HIGH (ref 11.4–15.2)

## 2024-01-07 LAB — LACTATE DEHYDROGENASE: LDH: 271 U/L — ABNORMAL HIGH (ref 98–192)

## 2024-01-07 LAB — AMMONIA: Ammonia: 38 umol/L — ABNORMAL HIGH (ref 9–35)

## 2024-01-07 LAB — OSMOLALITY, URINE: Osmolality, Ur: 182 mosm/kg — ABNORMAL LOW (ref 300–900)

## 2024-01-07 LAB — SODIUM: Sodium: 127 mmol/L — ABNORMAL LOW (ref 135–145)

## 2024-01-07 LAB — OSMOLALITY: Osmolality: 267 mosm/kg — ABNORMAL LOW (ref 275–295)

## 2024-01-07 LAB — HIV ANTIBODY (ROUTINE TESTING W REFLEX): HIV Screen 4th Generation wRfx: NONREACTIVE

## 2024-01-07 MED ORDER — LACTATED RINGERS IV SOLN: INTRAVENOUS | Status: AC

## 2024-01-07 MED ORDER — LACTATED RINGERS IV BOLUS
1000.0000 mL | Freq: Once | INTRAVENOUS | Status: AC
  Administered 2024-01-07: 1000 mL via INTRAVENOUS

## 2024-01-07 MED ORDER — ACETAMINOPHEN 325 MG PO TABS
650.0000 mg | ORAL_TABLET | Freq: Four times a day (QID) | ORAL | Status: DC | PRN
  Administered 2024-01-07 – 2024-01-15 (×3): 650 mg via ORAL
  Filled 2024-01-07 (×3): qty 2

## 2024-01-07 MED ORDER — ENOXAPARIN SODIUM 40 MG/0.4ML IJ SOSY
40.0000 mg | PREFILLED_SYRINGE | INTRAMUSCULAR | Status: DC
  Administered 2024-01-07: 40 mg via SUBCUTANEOUS
  Filled 2024-01-07: qty 0.4

## 2024-01-07 MED ORDER — POTASSIUM CHLORIDE CRYS ER 20 MEQ PO TBCR
40.0000 meq | EXTENDED_RELEASE_TABLET | Freq: Once | ORAL | Status: AC
  Administered 2024-01-07: 40 meq via ORAL
  Filled 2024-01-07: qty 2

## 2024-01-07 MED ORDER — IOHEXOL 350 MG/ML SOLN
75.0000 mL | Freq: Once | INTRAVENOUS | Status: AC | PRN
  Administered 2024-01-07: 75 mL via INTRAVENOUS

## 2024-01-07 MED ORDER — ALBUTEROL SULFATE (2.5 MG/3ML) 0.083% IN NEBU: 2.5000 mg | INHALATION_SOLUTION | RESPIRATORY_TRACT | Status: DC | PRN

## 2024-01-07 MED ORDER — LACTATED RINGERS IV BOLUS (SEPSIS)
1000.0000 mL | Freq: Once | INTRAVENOUS | Status: AC
  Administered 2024-01-07: 1000 mL via INTRAVENOUS

## 2024-01-07 MED ORDER — BUPRENORPHINE HCL-NALOXONE HCL 8-2 MG SL SUBL
1.0000 | SUBLINGUAL_TABLET | Freq: Three times a day (TID) | SUBLINGUAL | Status: DC
  Administered 2024-01-07 – 2024-01-15 (×24): 1 via SUBLINGUAL
  Filled 2024-01-07 (×24): qty 1

## 2024-01-07 MED ORDER — PANTOPRAZOLE SODIUM 40 MG PO TBEC
40.0000 mg | DELAYED_RELEASE_TABLET | Freq: Every day | ORAL | Status: DC
  Administered 2024-01-07 – 2024-01-26 (×20): 40 mg via ORAL
  Filled 2024-01-07 (×18): qty 1

## 2024-01-07 MED ORDER — LINEZOLID 600 MG/300ML IV SOLN
600.0000 mg | Freq: Two times a day (BID) | INTRAVENOUS | Status: AC
  Administered 2024-01-08 – 2024-01-14 (×13): 600 mg via INTRAVENOUS
  Filled 2024-01-07 (×16): qty 300

## 2024-01-07 MED ORDER — PIPERACILLIN-TAZOBACTAM 3.375 G IVPB 30 MIN
3.3750 g | Freq: Once | INTRAVENOUS | Status: AC
  Administered 2024-01-07: 3.375 g via INTRAVENOUS
  Filled 2024-01-07: qty 50

## 2024-01-07 MED ORDER — LACTATED RINGERS IV BOLUS (SEPSIS): 1000.0000 mL | Freq: Once | INTRAVENOUS | Status: DC

## 2024-01-07 MED ORDER — KETOROLAC TROMETHAMINE 15 MG/ML IJ SOLN
15.0000 mg | Freq: Once | INTRAMUSCULAR | Status: AC
  Administered 2024-01-07: 15 mg via INTRAVENOUS
  Filled 2024-01-07: qty 1

## 2024-01-07 MED ORDER — LINEZOLID 600 MG/300ML IV SOLN
600.0000 mg | Freq: Once | INTRAVENOUS | Status: AC
  Administered 2024-01-07: 600 mg via INTRAVENOUS
  Filled 2024-01-07: qty 300

## 2024-01-07 MED ORDER — PIPERACILLIN-TAZOBACTAM 3.375 G IVPB
3.3750 g | Freq: Three times a day (TID) | INTRAVENOUS | Status: DC
  Administered 2024-01-07 – 2024-01-11 (×9): 3.375 g via INTRAVENOUS
  Filled 2024-01-07 (×10): qty 50

## 2024-01-07 NOTE — Progress Notes (Signed)
 MD at bedside.

## 2024-01-07 NOTE — ED Notes (Signed)
 CCMD called. Pt placed on monitor.

## 2024-01-07 NOTE — ED Notes (Signed)
 Patient transported to CT

## 2024-01-07 NOTE — ED Notes (Signed)
 Patient still unable to provide urine sample at this time.

## 2024-01-07 NOTE — ED Provider Notes (Signed)
 [place] EMERGENCY DEPARTMENT AT [facility] Provider Note  CSN: [id number] Arrival date & time: 01/07/24 1253  Chief Complaint(s) Wound Infection (Right Leg) and Jaundice  HPI Robert Eaton is a 38 y.o. male with past medical history as below, significant for COPD, polysubstance abuse, alcohol abuse who presents to the ED with complaint of wound on his leg and jaundice  Patient is here from jail, reports right leg wound, pain and swelling over the past few days has continually worsening.  He is having difficulty ambulating secondary to the pain in his leg.  He feels feverish, increased fatigue.  He also has become jaundiced over the past 24 hours.  History of prior chronic alcohol abuse but does not drink alcohol since has been in jail.  He has some vague abdominal pain.  No nausea or vomiting, no excessive acetaminophen  use, no recent travel, no change in bowel or bladder function.  Past Medical History Past Medical History:  Diagnosis Date   COPD (chronic obstructive pulmonary disease) (HCC)    Irregular heartbeat    Patient Active Problem List   Diagnosis Date Noted   Alcohol use disorder 09/19/2023   Cocaine abuse (HCC) 09/19/2023   Opioid use disorder 09/19/2023   GERD (gastroesophageal reflux disease) 09/19/2023   Tobacco use disorder 09/19/2023   COPD (chronic obstructive pulmonary disease) (HCC)    Polysubstance abuse (HCC) 09/18/2023   Home Medication(s) Prior to Admission medications   Medication Sig Start Date End Date Taking? Authorizing Provider  albuterol  (VENTOLIN  HFA) 108 (90 Base) MCG/ACT inhaler Inhale 1-2 puffs into the lungs every 6 (six) hours as needed for wheezing or shortness of breath. 03/25/21  Yes Fondaw, Wylder S, PA  buprenorphine  (SUBUTEX ) 8 MG SUBL SL tablet Place 24 mg under the tongue daily.   Yes [provider]  pantoprazole  (PROTONIX ) 20 MG tablet Take 40 mg by mouth daily.   Yes [provider]  Buprenorphine   HCl-Naloxone  HCl (SUBOXONE ) 8-2 MG FILM Place 1 Film under the tongue 3 (three) times daily. Patient not taking: Reported on 01/07/2024    [provider]  nicotine  polacrilex (NICORETTE ) 2 MG gum Take 1 each (2 mg total) by mouth as needed for smoking cessation. Patient not taking: Reported on 01/07/2024 09/21/23   Ntuen, Tina C, FNP  pantoprazole  (PROTONIX ) 40 MG tablet Take 1 tablet (40 mg total) by mouth daily. Patient not taking: Reported on 01/07/2024 09/22/23   Raye Ellouise BROCKS, FNP                                                                                                                                    Past Surgical History Past Surgical History:  Procedure Laterality Date   LEG SURGERY Right    Family History History reviewed. No pertinent family history.  Social History Social History   Tobacco Use   Smoking status: Every Day    Current packs/day:  1.50    Types: Cigarettes    Passive exposure: Current   Smokeless tobacco: Never  Substance Use Topics   Alcohol use: Yes    Comment: several beers per day   Drug use: Yes    Types: Marijuana   Allergies Patient has no known allergies.  Review of Systems A thorough review of systems was obtained and all systems are negative except as noted in the HPI and PMH.   Physical Exam Vital Signs  I have reviewed the triage vital signs BP 102/65   Pulse (!) 104   Temp 98.5 F (36.9 C) (Oral)   Resp 17   Ht 5' 9 (1.753 m)   Wt 79.4 kg   SpO2 98%   BMI 25.84 kg/m  Physical Exam Vitals and nursing note reviewed.  Constitutional:      Appearance: He is well-developed. He is ill-appearing.  HENT:     Head: Normocephalic and atraumatic.     Right Ear: External ear normal.     Left Ear: External ear normal.     Mouth/Throat:     Mouth: Mucous membranes are moist.  Eyes:     General: Scleral icterus present.  Cardiovascular:     Rate and Rhythm: Regular rhythm. Tachycardia present.     Pulses: Normal pulses.      Heart sounds: Normal heart sounds.  Pulmonary:     Effort: Pulmonary effort is normal. No respiratory distress.     Breath sounds: Normal breath sounds.  Abdominal:     General: Abdomen is flat.     Palpations: Abdomen is soft.     Tenderness: There is generalized abdominal tenderness.     Comments: Not peritoneal   Musculoskeletal:     Cervical back: No rigidity.     Right lower leg: No edema.     Left lower leg: No edema.  Skin:    General: Skin is warm and dry.     Capillary Refill: Capillary refill takes less than 2 seconds.     Coloration: Skin is jaundiced.     Comments: Edema and wound to right lower extremity, palpable DP pulses bilateral, no crepitance, no bullae, compartments are soft, see photo  Neurological:     Mental Status: He is alert.  Psychiatric:        Mood and Affect: Mood normal.        Behavior: Behavior normal.     ED Results and Treatments Labs (all labs ordered are listed, but only abnormal results are displayed) Labs Reviewed  COMPREHENSIVE METABOLIC PANEL WITH GFR - Abnormal; Notable for the following components:      Result Value   Sodium 126 (*)    Potassium 3.4 (*)    Chloride 89 (*)    Glucose, Bld 122 (*)    Calcium 8.1 (*)    Total Protein 6.4 (*)    Albumin  1.9 (*)    AST 153 (*)    ALT 140 (*)    Alkaline Phosphatase 141 (*)    Total Bilirubin 5.6 (*)    All other components within normal limits  CBC WITH DIFFERENTIAL/PLATELET - Abnormal; Notable for the following components:   WBC 54.3 (*)    Hemoglobin 12.3 (*)    HCT 34.9 (*)    MCV 71.5 (*)    MCH 25.2 (*)    RDW 15.8 (*)    Neutro Abs 48.7 (*)    Monocytes Absolute 1.6 (*)    Basophils Absolute 0.3 (*)  Abs Immature Granulocytes 2.03 (*)    All other components within normal limits  PROTIME-INR - Abnormal; Notable for the following components:   Prothrombin Time 18.0 (*)    INR 1.4 (*)    All other components within normal limits  RESP PANEL BY RT-PCR (RSV,  FLU A&B, COVID)  RVPGX2  CULTURE, BLOOD (ROUTINE X 2)  URINE CULTURE  CULTURE, BLOOD (ROUTINE X 2) W REFLEX TO ID PANEL  LIPASE, BLOOD  HEPATITIS PANEL, ACUTE  ACETAMINOPHEN  LEVEL  BRAIN NATRIURETIC PEPTIDE  URINALYSIS, W/ REFLEX TO CULTURE (INFECTION SUSPECTED)  NA AND K (SODIUM & POTASSIUM), RAND UR  OSMOLALITY, URINE  OSMOLALITY  AMMONIA  I-STAT CG4 LACTIC ACID, ED  I-STAT CG4 LACTIC ACID, ED                                                                                                                          Radiology CT ABDOMEN PELVIS W CONTRAST Result Date: 01/07/2024 CLINICAL DATA:  Abdominal pain.  Jaundice. EXAM: CT ABDOMEN AND PELVIS WITH CONTRAST TECHNIQUE: Multidetector CT imaging of the abdomen and pelvis was performed using the standard protocol following bolus administration of intravenous contrast. RADIATION DOSE REDUCTION: This exam was performed according to the departmental dose-optimization program which includes automated exposure control, adjustment of the mA and/or kV according to patient size and/or use of iterative reconstruction technique. CONTRAST:  75mL OMNIPAQUE  IOHEXOL  350 MG/ML SOLN COMPARISON:  None Available. FINDINGS: Lower chest: The visualized lung bases are clear. No intra-abdominal free air or free fluid. Hepatobiliary: Fatty liver. No biliary dilatation. Gallstones. No pericholecystic fluid or evidence of acute cholecystitis by CT. Pancreas: Unremarkable. No pancreatic ductal dilatation or surrounding inflammatory changes. Spleen: Normal in size without focal abnormality. Adrenals/Urinary Tract: The adrenal glands unremarkable. Nonobstructing right renal inferior pole calculi measure up to 2 mm. No hydronephrosis. The left kidney is unremarkable. The visualized ureters and urinary bladder appear unremarkable. Stomach/Bowel: There is no bowel obstruction or active inflammation. The appendix is normal. Vascular/Lymphatic: Mild aortoiliac atherosclerotic  disease. The IVC is unremarkable. Right iliac chain and inguinal adenopathy measure up to 2.1 cm in short axis concerning for malignancy. Several enlarged lymph nodes in the proximal right thigh. There is mild edema in the superficial soft tissues of the right thigh. Reproductive: The prostate and seminal vesicles are grossly unremarkable. Other: None Musculoskeletal: Partially visualized right femoral nail. No acute osseous pathology. IMPRESSION: 1. Right iliac chain and inguinal adenopathy concerning for malignancy. 2. Fatty liver. 3. Cholelithiasis. 4. Nonobstructing right renal inferior pole calculi. No hydronephrosis. 5.  Aortic Atherosclerosis (ICD10-I70.0). Electronically Signed   By: Vanetta Chou M.D.   On: 01/07/2024 15:36   DG Tibia/Fibula Right Result Date: 01/07/2024 CLINICAL DATA:  Leg swelling and pain. EXAM: RIGHT TIBIA AND FIBULA - 2 VIEW COMPARISON:  None Available. FINDINGS: There is no evidence of acute fracture. Partially visualized fixation hardware in the distal femur. Diffuse generalized subcutaneous edema and cutaneous thickening of the right lower  extremity. No soft tissue gas. IMPRESSION: 1. Diffuse generalized subcutaneous edema and cutaneous thickening of the right lower extremity, which could reflect cellulitis in the appropriate clinical setting. No soft tissue gas. 2. No acute osseous abnormality. Electronically Signed   By: Harrietta Sherry M.D.   On: 01/07/2024 13:38   DG Chest Port 1 View Result Date: 01/07/2024 CLINICAL DATA:  Concern for sepsis. EXAM: PORTABLE CHEST 1 VIEW COMPARISON:  01/11/2022. FINDINGS: The heart size and mediastinal contours are within normal limits. No focal consolidation, sizeable pleural effusion, or pneumothorax. No acute osseous abnormality. IMPRESSION: No acute cardiopulmonary findings. Electronically Signed   By: Harrietta Sherry M.D.   On: 01/07/2024 13:37    Pertinent labs & imaging results that were available during my care of the  patient were reviewed by me and considered in my medical decision making (see MDM for details).  Medications Ordered in ED Medications  lactated ringers  infusion ( Intravenous New Bag/Given 01/07/24 1346)  lactated ringers  bolus 1,000 mL (0 mLs Intravenous Stopped 01/07/24 1523)  piperacillin -tazobactam (ZOSYN ) IVPB 3.375 g (0 g Intravenous Stopped 01/07/24 1446)  linezolid  (ZYVOX ) IVPB 600 mg (0 mg Intravenous Stopped 01/07/24 1555)  ketorolac  (TORADOL ) 15 MG/ML injection 15 mg (15 mg Intravenous Given 01/07/24 1334)  iohexol  (OMNIPAQUE ) 350 MG/ML injection 75 mL (75 mLs Intravenous Contrast Given 01/07/24 1527)                                                                                                                                     Procedures .Critical Care  Performed by: Elnor Jayson LABOR, DO Authorized by: Elnor Jayson LABOR, DO   Critical care provider statement:    Critical care time (minutes):  50   Critical care time was exclusive of:  Separately billable procedures and treating other patients   Critical care was necessary to treat or prevent imminent or life-threatening deterioration of the following conditions:  Sepsis   Critical care was time spent personally by me on the following activities:  Development of treatment plan with patient or surrogate, discussions with consultants, evaluation of patient's response to treatment, examination of patient, ordering and review of laboratory studies, ordering and review of radiographic studies, ordering and performing treatments and interventions, pulse oximetry, re-evaluation of patient's condition, review of old charts and obtaining history from patient or surrogate   Care discussed with: admitting provider     (including critical care time)  Medical Decision Making / ED Course    Medical Decision Making:    Robert Eaton is a 38 y.o. male  with past medical history as below, significant for COPD, polysubstance abuse, alcohol abuse  who presents to the ED with complaint of wound on his leg and jaundice. The complaint involves an extensive differential diagnosis and also carries with it a high risk of complications and morbidity.  Serious etiology was considered. Ddx includes but is not limited to: Cellulitis, necrotizing  fasciitis, compartment syndrome, biliary obstruction, cirrhosis, infection, neoplasm, CHF, ammonia, encephalitis, viral syndrome, UTI etc.    Complete initial physical exam performed, notably the patient was in he is in no acute distress, he is tachycardic and febrile, no hypoxia.    Reviewed and confirmed nursing documentation for past medical history, family history, social history.  Vital signs reviewed.     Right lower extremity cellulitis Sepsis> - No crepitance, no bullae, does not appear to be necrotizing fasciitis, compartments are soft.  Right lower extremity is very swollen, there appears to be diffuse cellulitis.  Start broad-spectrum antibiotics - palpable DP pulse b/l - X-ray without gas, no crepitance - WBC 54.3, febrile, tachycardia, code sepsis > zyvox /zosyn  ordered, blood ctx, IVF resus  Jaundice > - Prior alcohol abuse but has not been drinking since he has been in jail - Vague abdominal discomfort, he does have fever.   - Bilirubin is 5.6, AST and ALT are elevated along with alkaline phosphatase with low albumin ; history of chronic alcohol abuse, concern for cirrhosis, does not appear to have ascites - CT shows hepatic steatosis, no obvious cirrhotic changes, he has cholelithiasis but no cholecystitis.  Hyponatremia > - Unclear etiology, check urine electrolytes and osmolality - Patient is being rehydrated for his sepsis       Clinical Course as of 01/07/24 1608  Wed Jan 07, 2024  1418 Total Bilirubin(!): 5.6 MELD3.0 = 24 [SG]    Clinical Course User Index [SG] Elnor Jayson LABOR, DO       Admit for sepsis secondary to severe right lower extremity cellulitis,  hyponatremia, liver disease, hyponatremia.              Additional history obtained: -Additional history obtained from ems, [facility] officer -External records from outside source obtained and reviewed including: Chart review including previous notes, labs, imaging, consultation notes including  Prior ER evaluation, prior medications   Lab Tests: -I ordered, reviewed, and interpreted labs.   The pertinent results include:   Labs Reviewed  COMPREHENSIVE METABOLIC PANEL WITH GFR - Abnormal; Notable for the following components:      Result Value   Sodium 126 (*)    Potassium 3.4 (*)    Chloride 89 (*)    Glucose, Bld 122 (*)    Calcium 8.1 (*)    Total Protein 6.4 (*)    Albumin  1.9 (*)    AST 153 (*)    ALT 140 (*)    Alkaline Phosphatase 141 (*)    Total Bilirubin 5.6 (*)    All other components within normal limits  CBC WITH DIFFERENTIAL/PLATELET - Abnormal; Notable for the following components:   WBC 54.3 (*)    Hemoglobin 12.3 (*)    HCT 34.9 (*)    MCV 71.5 (*)    MCH 25.2 (*)    RDW 15.8 (*)    Neutro Abs 48.7 (*)    Monocytes Absolute 1.6 (*)    Basophils Absolute 0.3 (*)    Abs Immature Granulocytes 2.03 (*)    All other components within normal limits  PROTIME-INR - Abnormal; Notable for the following components:   Prothrombin Time 18.0 (*)    INR 1.4 (*)    All other components within normal limits  RESP PANEL BY RT-PCR (RSV, FLU A&B, COVID)  RVPGX2  CULTURE, BLOOD (ROUTINE X 2)  URINE CULTURE  CULTURE, BLOOD (ROUTINE X 2) W REFLEX TO ID PANEL  LIPASE, BLOOD  HEPATITIS PANEL, ACUTE  ACETAMINOPHEN  LEVEL  BRAIN NATRIURETIC PEPTIDE  URINALYSIS, W/ REFLEX TO CULTURE (INFECTION SUSPECTED)  NA AND K (SODIUM & POTASSIUM), RAND UR  OSMOLALITY, URINE  OSMOLALITY  AMMONIA  I-STAT CG4 LACTIC ACID, ED  I-STAT CG4 LACTIC ACID, ED    Notable for as above  EKG   EKG Interpretation Date/Time:    Ventricular Rate:    PR Interval:    QRS Duration:    QT  Interval:    QTC Calculation:   R Axis:      Text Interpretation:           Imaging Studies ordered: I ordered imaging studies including chest x-ray, tib-fib x-ray, CTAP I independently visualized the following imaging with scope of interpretation limited to determining acute life threatening conditions related to emergency care; findings noted above I agree with the radiologist interpretation If any imaging was obtained with contrast I closely monitored patient for any possible adverse reaction a/w contrast administration in the emergency department   Medicines ordered and prescription drug management: Meds ordered this encounter  Medications   lactated ringers  infusion   lactated ringers  bolus 1,000 mL    Reason 30 mL/kg dose is not being ordered:   First Lactic Acid Pending   piperacillin -tazobactam (ZOSYN ) IVPB 3.375 g    Antibiotic Indication::   Cellulitis   linezolid  (ZYVOX ) IVPB 600 mg    Antibiotic Indication::   Cellulitis   ketorolac  (TORADOL ) 15 MG/ML injection 15 mg   iohexol  (OMNIPAQUE ) 350 MG/ML injection 75 mL    -I have reviewed the patients home medicines and have made adjustments as needed   Consultations Obtained: I requested consultation with the hospitalist,  and discussed lab and imaging findings as well as pertinent plan    Cardiac Monitoring: The patient was maintained on a cardiac monitor.  I personally viewed and interpreted the cardiac monitored which showed an underlying rhythm of: Sinus tachycardia Continuous pulse oximetry interpreted by myself, 97% on ra.    Social Determinants of Health:  Diagnosis or treatment significantly limited by social determinants of health: current smoker, alcohol use, and polysubstance abuse   Reevaluation: After the interventions noted above, I reevaluated the patient and found that they have improved  Co morbidities that complicate the patient evaluation  Past Medical History:  Diagnosis Date   COPD  (chronic obstructive pulmonary disease) (HCC)    Irregular heartbeat       Dispostion: Disposition decision including need for hospitalization was considered, and patient admitted to the hospital.    Final Clinical Impression(s) / ED Diagnoses Final diagnoses:  Sepsis, due to unspecified organism, unspecified whether acute organ dysfunction present (HCC)  Jaundice  Hyperbilirubinemia  Hypokalemia  Hyponatremia  Hepatic steatosis  Biliary calculus of other site without obstruction        Elnor Jayson LABOR, DO 01/07/24 1608

## 2024-01-07 NOTE — Progress Notes (Signed)
   01/07/24 2028  Assess: MEWS Score  Temp (!) 101.8 F (38.8 C)  BP (!) 89/56  MAP (mmHg) 85  Pulse Rate (!) 101  SpO2 96 %  O2 Device Room Air  Assess: MEWS Score  MEWS Temp 2  MEWS Systolic 1  MEWS Pulse 1  MEWS RR 0  MEWS LOC 0  MEWS Score 4  MEWS Score Color Red  Assess: if the MEWS score is Yellow or Red  Were vital signs accurate and taken at a resting state? Yes  Does the patient meet 2 or more of the SIRS criteria? Yes  Does the patient have a confirmed or suspected source of infection? Yes  MEWS guidelines implemented  Yes, red  Treat  MEWS Interventions Considered administering scheduled or prn medications/treatments as ordered  Take Vital Signs  Increase Vital Sign Frequency  Red: Q1hr x2, continue Q4hrs until patient remains green for 12hrs  Escalate  MEWS: Escalate Red: Discuss with charge nurse and notify provider. Consider notifying RRT. If remains red for 2 hours consider need for higher level of care  Notify: Charge Nurse/RN  Name of Charge Nurse/RN Notified Ezzie Applethwaite RN  Provider Notification  Provider Name/Title Letha Cheadle, MD  Date Provider Notified 01/07/24  Time Provider Notified 2031  Method of Notification Call  Notification Reason Change in status  Provider response En route  Date of Provider Response 01/07/24  Time of Provider Response 2035  Assess: SIRS CRITERIA  SIRS Temperature  1  SIRS Respirations  0  SIRS Pulse 1  SIRS WBC 1  SIRS Score Sum  3

## 2024-01-07 NOTE — Sepsis Progress Note (Signed)
 Code sepsis protocol being monitored by eLink.

## 2024-01-07 NOTE — Plan of Care (Signed)
   Problem: Education: Goal: Knowledge of General Education information will improve Description: Including pain rating scale, medication(s)/side effects and non-pharmacologic comfort measures Outcome: Progressing   Problem: Skin Integrity: Goal: Risk for impaired skin integrity will decrease Outcome: Progressing

## 2024-01-07 NOTE — ED Triage Notes (Signed)
 Patient here via [facility] from [facility] for right leg swelling and weeping as well as new onset jaundice. Patient reports swelling and pain x4 days, no known injury. Denies fevers, however febrile in triage. Staff reports jaundice beginning today. 1L LR given en route, no medications given. No hx of liver disease.

## 2024-01-07 NOTE — H&P (Signed)
 Date: 01/07/2024               Patient Name:  Robert Eaton MRN: [id number]  DOB: 1985/12/02 Age / Sex: 38 y.o., male   PCP: Pcp, No         Medical Service: Internal Medicine Teaching Service         Attending Physician: Dr. Karna Fellows, MD      First Contact: Dr. Lamonte Colace, DO    Second Contact: Dr. Fairy Pool, DO         After Hours (After 5p/  First Contact Pager: [phone]  weekends / holidays): Second Contact Pager: [phone]   SUBJECTIVE   Chief Complaint: RLE wound and jaundice  History of Present Illness:  Robert Eaton is a 38 year old male with a past medical history of COPD, polysubstance abuse, abuse disorder on Suboxone , alcohol use disorder without known withdrawal, and MDD/GAD who presents with 1 week of general malaise and right lower extremity swelling and pain now accompanied by 1 day of jaundice.  He did not notice any wound in his right lower extremity that started before the swelling started.  He now has some skin breakdown in the medial right calf but denies any previous wound there.  Denies any needles or other puncture wounds.  He has previously had surgery after traumatic injury of his right lower extremity with indwelling hardware in his right upper leg.  He was placed in jail about 2 weeks ago and other than his Suboxone  has not been able to take any of his regular medicines.  He had significant GERD symptoms but otherwise denies dysphagia, nausea, vomiting, abdominal pain, changes in bowel or bladder habits, chest pain, shortness of breath, vision changes, or focal weakness.  No previous episodes similar to this with the leg swelling or jaundice.  ED Course: Presents as above febrile, tachycardic, normotensive, and saturating well on room air without tachypnea.  Labs significant for leukocytosis of 54, platelet 262, sodium of 126, AST of 153, ALT of 140, alkaline phosphatase of 141, INR 1.4, negative for hepatitis A/B/C, and lactic acid of 4.3.  X-ray of  the right lower extremity showed diffuse subcutaneous edema but no soft tissue gas or osseous abnormality.  CT of the abdomen and pelvis with contrast showed right iliac chain and inguinal adenopathy concerning for malignancy, fatty liver, cholelithiasis without evidence of obstruction, and nonobstructing right renal calculi without hydronephrosis.  Blood cultures were drawn and he was started on linezolid  and piperacillin nadine.  Past Medical History COPD Polysubstance abuse Opiate use disorder on Suboxone  Alcohol use disorder without withdrawal Major depressive disorder/generalized anxiety disorder  Meds:  Since being put in jail around 7/15 he has only had these medications Albuterol  as needed Pantoprazole  40 mg daily, inconsistent access to this Ibuprofen  as needed Suboxone  (8-2 mg films 3 times daily, based on most recent prescription at the beginning of July)  Denied getting these medications while in jail Seroquel 100 mg daily Sertraline 25 mg daily Trazodone  50 mg daily  Past Surgical History Past Surgical History:  Procedure Laterality Date   LEG SURGERY Right     Social:  Lives currently in jail, unclear but possibly unhoused previously.  Independent in ADLs and IADLs. Support: Father and brother PCP: Pcp, No, previously was getting prescriptions filled by [facility] Substances: 1.5 packs/day cigarette use, prior heavy alcohol use after 2019-08-11 (mother passed away), prior reported cocaine use  Family History:  Mother died of metastatic breast or lung cancer Paternal  uncle colon cancer  Allergies: Allergies as of 01/07/2024   (No Known Allergies)    Review of Systems: A complete ROS was negative except as per HPI.   OBJECTIVE:   Physical Exam: Blood pressure 102/65, pulse (!) 104, temperature 98.5 F (36.9 C), temperature source Oral, resp. rate 17, height 5' 9 (1.753 m), weight 79.4 kg, SpO2 98%.  Constitutional: Ill-appearing adult male sitting  in bed. In no acute distress. HENT: Normocephalic, atraumatic,  Eyes: Mild scleral icterus, PERRL, EOM intact Cardio:Regular rate and rhythm. 2+ bilateral radial and dorsalis pedis  pulses. Pulm:Clear to auscultation bilaterally. Normal work of breathing on room air. Abdomen: Soft, non-tender, non-distended, positive bowel sounds. MSK: Left lower extremity without edema or abnormality.  Right lower extremity with significant edema up to the proximal knee with erythema and increased warmth over the calf circumferentially with skin breakdown and a 10 x 10 cm area in the medial calf with associated weeping and possible purulent material overlying Neuro:Alert and oriented x3. No focal deficit noted. Psych:Pleasant mood and affect.  Labs: CBC    Component Value Date/Time   WBC 54.3 (HH) 01/07/2024 1320   RBC 4.88 01/07/2024 1320   HGB 12.3 (L) 01/07/2024 1320   HCT 34.9 (L) 01/07/2024 1320   PLT 262 01/07/2024 1320   MCV 71.5 (L) 01/07/2024 1320   MCH 25.2 (L) 01/07/2024 1320   MCHC 35.2 01/07/2024 1320   RDW 15.8 (H) 01/07/2024 1320   LYMPHSABS 1.7 01/07/2024 1320   MONOABS 1.6 (H) 01/07/2024 1320   EOSABS 0.0 01/07/2024 1320   BASOSABS 0.3 (H) 01/07/2024 1320     CMP     Component Value Date/Time   NA 127 (L) 01/07/2024 1753   K 3.4 (L) 01/07/2024 1320   CL 89 (L) 01/07/2024 1320   CO2 25 01/07/2024 1320   GLUCOSE 122 (H) 01/07/2024 1320   BUN 11 01/07/2024 1320   CREATININE 0.94 01/07/2024 1320   CALCIUM 8.1 (L) 01/07/2024 1320   PROT 6.4 (L) 01/07/2024 1320   ALBUMIN  1.9 (L) 01/07/2024 1320   AST 153 (H) 01/07/2024 1320   ALT 140 (H) 01/07/2024 1320   ALKPHOS 141 (H) 01/07/2024 1320   BILITOT 5.6 (H) 01/07/2024 1320   GFRNONAA >60 01/07/2024 1320    Imaging: CT ABDOMEN PELVIS W CONTRAST Result Date: 01/07/2024 CLINICAL DATA:  Abdominal pain.  Jaundice. EXAM: CT ABDOMEN AND PELVIS WITH CONTRAST TECHNIQUE: Multidetector CT imaging of the abdomen and pelvis was  performed using the standard protocol following bolus administration of intravenous contrast. RADIATION DOSE REDUCTION: This exam was performed according to the departmental dose-optimization program which includes automated exposure control, adjustment of the mA and/or kV according to patient size and/or use of iterative reconstruction technique. CONTRAST:  75mL OMNIPAQUE  IOHEXOL  350 MG/ML SOLN COMPARISON:  None Available. FINDINGS: Lower chest: The visualized lung bases are clear. No intra-abdominal free air or free fluid. Hepatobiliary: Fatty liver. No biliary dilatation. Gallstones. No pericholecystic fluid or evidence of acute cholecystitis by CT. Pancreas: Unremarkable. No pancreatic ductal dilatation or surrounding inflammatory changes. Spleen: Normal in size without focal abnormality. Adrenals/Urinary Tract: The adrenal glands unremarkable. Nonobstructing right renal inferior pole calculi measure up to 2 mm. No hydronephrosis. The left kidney is unremarkable. The visualized ureters and urinary bladder appear unremarkable. Stomach/Bowel: There is no bowel obstruction or active inflammation. The appendix is normal. Vascular/Lymphatic: Mild aortoiliac atherosclerotic disease. The IVC is unremarkable. Right iliac chain and inguinal adenopathy measure up to 2.1 cm in short axis  concerning for malignancy. Several enlarged lymph nodes in the proximal right thigh. There is mild edema in the superficial soft tissues of the right thigh. Reproductive: The prostate and seminal vesicles are grossly unremarkable. Other: None Musculoskeletal: Partially visualized right femoral nail. No acute osseous pathology. IMPRESSION: 1. Right iliac chain and inguinal adenopathy concerning for malignancy. 2. Fatty liver. 3. Cholelithiasis. 4. Nonobstructing right renal inferior pole calculi. No hydronephrosis. 5.  Aortic Atherosclerosis (ICD10-I70.0). Electronically Signed   By: Vanetta Chou M.D.   On: 01/07/2024 15:36   DG  Tibia/Fibula Right Result Date: 01/07/2024 CLINICAL DATA:  Leg swelling and pain. EXAM: RIGHT TIBIA AND FIBULA - 2 VIEW COMPARISON:  None Available. FINDINGS: There is no evidence of acute fracture. Partially visualized fixation hardware in the distal femur. Diffuse generalized subcutaneous edema and cutaneous thickening of the right lower extremity. No soft tissue gas. IMPRESSION: 1. Diffuse generalized subcutaneous edema and cutaneous thickening of the right lower extremity, which could reflect cellulitis in the appropriate clinical setting. No soft tissue gas. 2. No acute osseous abnormality. Electronically Signed   By: Harrietta Sherry M.D.   On: 01/07/2024 13:38   DG Chest Port 1 View Result Date: 01/07/2024 CLINICAL DATA:  Concern for sepsis. EXAM: PORTABLE CHEST 1 VIEW COMPARISON:  01/11/2022. FINDINGS: The heart size and mediastinal contours are within normal limits. No focal consolidation, sizeable pleural effusion, or pneumothorax. No acute osseous abnormality. IMPRESSION: No acute cardiopulmonary findings. Electronically Signed   By: Harrietta Sherry M.D.   On: 01/07/2024 13:37     EKG: personally reviewed my interpretation is sinus tachycardia. Consistent with prior EKG.  ASSESSMENT & PLAN:   Assessment & Plan by Problem: Principal Problem:   Cellulitis of right lower extremity   Raheen Capili is a 38 y.o. male with pertinent PMH of COPD, polysubstance abuse, abuse disorder on Suboxone , alcohol use disorder without known withdrawal, and MDD/GAD who presented with 1 week of right lower extremity edema and 1 day of jaundice and is admitted for sepsis 2/2 right lower extremity cellulitis with hyperbilirubinemia and severe leukocytosis.  Sepsis Right lower extremity cellulitis Unknown nidus for infection and imaging so far without signs of abscess or gas producing organism.  Given linezolid  and Zosyn  in the ED.  Lactic trending down from 4.  Severe leukocytosis of 54, pending smear.   Fever has resolved and tachycardia has improved after IV fluids and antibiotics started.  Cultures drawn before antibiotics.  Although significantly edematous no signs of compartment syndrome. - Continue linezolid  and Zosyn  - Follow for culture results - Monitor for fevers and daily CBC - Page for any signs of further ascending infection  Severe leukocytosis WBCs 54.3 in the setting of severe infection and likely leukemoid reaction.  Predominantly neutrophils.  No signs of leukostasis or history of AML/AL L given no respiratory distress, neurologic deficits, or evidence of multiorgan failure although he does have hyperbilirubinemia with mild liver enzyme elevation.  Evidence of DIC associated with APL. - Follow-up peripheral smear - Repeat CBC in the morning  Hyperbilirubinemia Transaminitis Bilirubin elevated at 5.6 and patient with jaundice.  AST and ALT moderately elevated at 153 140 respectively with alkaline phosphatase at 141.  No signs of biliary obstruction but there is cholelithiasis on the CT scan.  No history of known liver failure or cirrhosis.  No evidence of ascending cholangitis.  Platelets currently normal without history of thrombocytopenia.  Pending fractionation of bilirubin.  LDH elevated at 271 but no significant anemia with hemoglobin 12.3. -  Follow-up bilirubin fractionation - Repeat hepatic function panel in morning  Hyponatremia Sodium on admission 126 and slightly improved to 127 after 2 liters of IV fluids.  Likely in the setting of sepsis and leukemoid reaction.  Unlikely to be acute blast crisis but pending smear. - Follow-up BMP in the morning  Iliac and inguinal adenopathy Seen on CT scan and worrisome for malignancy.  No personal history of malignancy but does have a family history of breast or lung cancer in the mother and colon cancer in a paternal uncle.  He also has a strong smoking history but no obvious abnormalities on chest x-ray.  We will determine if he  requires further imaging for malignancy workup or repeat CT scan after acute infection resolved during this admission.  Microcytosis Significant microcytosis with MCV of 71.5 and slightly elevated RDW. - Follow-up peripheral blood smear  Opiate use disorder Continue prior Suboxone  dose 8-2 mg 3 times daily.  COPD Continue as needed albuterol .  No signs of respiratory distress or COPD exacerbation.  Diet: Normal VTE: Enoxaparin  Code: Full  Dispo: Admit patient to Inpatient with expected length of stay greater than 2 midnights.  Signed: Fairy Pool, DO Internal Medicine Resident, PGY-3 Please contact the on call pager at [phone] for any urgent or emergent needs. 6:47 PM 01/07/2024

## 2024-01-08 ENCOUNTER — Other Ambulatory Visit: Payer: Self-pay

## 2024-01-08 ENCOUNTER — Encounter (HOSPITAL_COMMUNITY): Admission: EM | Payer: Self-pay | Attending: Internal Medicine

## 2024-01-08 ENCOUNTER — Inpatient Hospital Stay (HOSPITAL_COMMUNITY): Admitting: Anesthesiology

## 2024-01-08 ENCOUNTER — Inpatient Hospital Stay (HOSPITAL_COMMUNITY)

## 2024-01-08 ENCOUNTER — Encounter (HOSPITAL_COMMUNITY): Payer: Self-pay | Admitting: Internal Medicine

## 2024-01-08 DIAGNOSIS — A419 Sepsis, unspecified organism: Secondary | ICD-10-CM | POA: Diagnosis present

## 2024-01-08 DIAGNOSIS — L089 Local infection of the skin and subcutaneous tissue, unspecified: Secondary | ICD-10-CM | POA: Diagnosis not present

## 2024-01-08 DIAGNOSIS — M729 Fibroblastic disorder, unspecified: Secondary | ICD-10-CM | POA: Diagnosis present

## 2024-01-08 DIAGNOSIS — F1090 Alcohol use, unspecified, uncomplicated: Secondary | ICD-10-CM

## 2024-01-08 DIAGNOSIS — E871 Hypo-osmolality and hyponatremia: Secondary | ICD-10-CM | POA: Diagnosis present

## 2024-01-08 DIAGNOSIS — R17 Unspecified jaundice: Secondary | ICD-10-CM | POA: Diagnosis present

## 2024-01-08 HISTORY — PX: INCISION AND DRAINAGE OF WOUND: SHX1803

## 2024-01-08 HISTORY — PX: APPLICATION OF WOUND VAC: SHX5189

## 2024-01-08 LAB — CBC WITH DIFFERENTIAL/PLATELET
Abs Immature Granulocytes: 1.54 K/uL — ABNORMAL HIGH (ref 0.00–0.07)
Basophils Absolute: 0.1 K/uL (ref 0.0–0.1)
Basophils Relative: 0 %
Eosinophils Absolute: 0.1 K/uL (ref 0.0–0.5)
Eosinophils Relative: 0 %
HCT: 28.2 % — ABNORMAL LOW (ref 39.0–52.0)
Hemoglobin: 10.1 g/dL — ABNORMAL LOW (ref 13.0–17.0)
Immature Granulocytes: 4 %
Lymphocytes Relative: 3 %
Lymphs Abs: 1.4 K/uL (ref 0.7–4.0)
MCH: 25.4 pg — ABNORMAL LOW (ref 26.0–34.0)
MCHC: 35.8 g/dL (ref 30.0–36.0)
MCV: 71 fL — ABNORMAL LOW (ref 80.0–100.0)
Monocytes Absolute: 1.4 K/uL — ABNORMAL HIGH (ref 0.1–1.0)
Monocytes Relative: 3 %
Neutro Abs: 39.2 K/uL — ABNORMAL HIGH (ref 1.7–7.7)
Neutrophils Relative %: 90 %
Platelets: 244 K/uL (ref 150–400)
RBC: 3.97 MIL/uL — ABNORMAL LOW (ref 4.22–5.81)
RDW: 15.5 % (ref 11.5–15.5)
Smear Review: NORMAL
WBC: 43.7 K/uL — ABNORMAL HIGH (ref 4.0–10.5)
nRBC: 0 % (ref 0.0–0.2)

## 2024-01-08 LAB — HEPATIC FUNCTION PANEL
ALT: 92 U/L — ABNORMAL HIGH (ref 0–44)
AST: 108 U/L — ABNORMAL HIGH (ref 15–41)
Albumin: <1.5 g/dL — ABNORMAL LOW (ref 3.5–5.0)
Alkaline Phosphatase: 122 U/L (ref 38–126)
Bilirubin, Direct: 2.9 mg/dL — ABNORMAL HIGH (ref 0.0–0.2)
Indirect Bilirubin: 1.4 mg/dL — ABNORMAL HIGH (ref 0.3–0.9)
Total Bilirubin: 4.3 mg/dL — ABNORMAL HIGH (ref 0.0–1.2)
Total Protein: 4.9 g/dL — ABNORMAL LOW (ref 6.5–8.1)

## 2024-01-08 LAB — BASIC METABOLIC PANEL WITH GFR
Anion gap: 5 (ref 5–15)
BUN: 8 mg/dL (ref 6–20)
CO2: 28 mmol/L (ref 22–32)
Calcium: 7.7 mg/dL — ABNORMAL LOW (ref 8.9–10.3)
Chloride: 97 mmol/L — ABNORMAL LOW (ref 98–111)
Creatinine, Ser: 0.78 mg/dL (ref 0.61–1.24)
GFR, Estimated: >60 mL/min (ref >60)
Glucose, Bld: 109 mg/dL — ABNORMAL HIGH (ref 70–99)
Potassium: 3.9 mmol/L (ref 3.5–5.1)
Sodium: 130 mmol/L — ABNORMAL LOW (ref 135–145)

## 2024-01-08 LAB — MRSA NEXT GEN BY PCR, NASAL: MRSA by PCR Next Gen: NOT DETECTED

## 2024-01-08 LAB — LACTIC ACID, PLASMA: Lactic Acid, Venous: 1.4 mmol/L (ref 0.5–1.9)

## 2024-01-08 SURGERY — IRRIGATION AND DEBRIDEMENT WOUND
Anesthesia: General | Site: Leg Lower | Laterality: Right

## 2024-01-08 MED ORDER — FENTANYL CITRATE (PF) 250 MCG/5ML IJ SOLN
INTRAMUSCULAR | Status: AC
  Filled 2024-01-08: qty 5

## 2024-01-08 MED ORDER — PHENYLEPHRINE 80 MCG/ML (10ML) SYRINGE FOR IV PUSH (FOR BLOOD PRESSURE SUPPORT)
PREFILLED_SYRINGE | INTRAVENOUS | Status: AC
  Filled 2024-01-08: qty 10

## 2024-01-08 MED ORDER — POVIDONE-IODINE 10 % EX SWAB
2.0000 | Freq: Once | CUTANEOUS | Status: AC
  Administered 2024-01-08: 2 via TOPICAL

## 2024-01-08 MED ORDER — ROCURONIUM BROMIDE 10 MG/ML (PF) SYRINGE
PREFILLED_SYRINGE | INTRAVENOUS | Status: AC
  Filled 2024-01-08: qty 10

## 2024-01-08 MED ORDER — MIDAZOLAM HCL 2 MG/2ML IJ SOLN
INTRAMUSCULAR | Status: AC
Start: 2024-01-08 — End: 2024-01-08
  Filled 2024-01-08: qty 2

## 2024-01-08 MED ORDER — PHENYLEPHRINE 80 MCG/ML (10ML) SYRINGE FOR IV PUSH (FOR BLOOD PRESSURE SUPPORT)
PREFILLED_SYRINGE | INTRAVENOUS | Status: DC | PRN
  Administered 2024-01-08 (×2): 160 ug via INTRAVENOUS

## 2024-01-08 MED ORDER — DEXMEDETOMIDINE HCL IN NACL 80 MCG/20ML IV SOLN
INTRAVENOUS | Status: DC | PRN
Start: 2024-01-08 — End: 2024-01-09
  Administered 2024-01-08: 8 ug via INTRAVENOUS
  Administered 2024-01-08: 4 ug via INTRAVENOUS
  Administered 2024-01-08: 12 ug via INTRAVENOUS
  Administered 2024-01-08 (×2): 8 ug via INTRAVENOUS

## 2024-01-08 MED ORDER — PROPOFOL 10 MG/ML IV BOLUS
INTRAVENOUS | Status: AC
Start: 2024-01-08 — End: 2024-01-08
  Filled 2024-01-08: qty 20

## 2024-01-08 MED ORDER — HYDROMORPHONE HCL 1 MG/ML IJ SOLN
0.5000 mg | Freq: Once | INTRAMUSCULAR | Status: AC
  Administered 2024-01-08: 0.5 mg via INTRAVENOUS
  Filled 2024-01-08: qty 0.5

## 2024-01-08 MED ORDER — HYDROMORPHONE HCL 1 MG/ML IJ SOLN
INTRAMUSCULAR | Status: AC
  Filled 2024-01-08: qty 0.5

## 2024-01-08 MED ORDER — CHLORHEXIDINE GLUCONATE 0.12 % MT SOLN
15.0000 mL | Freq: Once | OROMUCOSAL | Status: AC
  Administered 2024-01-08: 15 mL via OROMUCOSAL
  Filled 2024-01-08: qty 15

## 2024-01-08 MED ORDER — LIDOCAINE 2% (20 MG/ML) 5 ML SYRINGE
INTRAMUSCULAR | Status: AC
  Filled 2024-01-08: qty 5

## 2024-01-08 MED ORDER — ORAL CARE MOUTH RINSE: 15.0000 mL | Freq: Once | OROMUCOSAL | Status: AC

## 2024-01-08 MED ORDER — MIDAZOLAM HCL 2 MG/2ML IJ SOLN
INTRAMUSCULAR | Status: DC | PRN
  Administered 2024-01-08: 2 mg via INTRAVENOUS

## 2024-01-08 MED ORDER — PROPOFOL 10 MG/ML IV BOLUS
INTRAVENOUS | Status: DC | PRN
  Administered 2024-01-08: 120 mg via INTRAVENOUS

## 2024-01-08 MED ORDER — HYDROMORPHONE BOLUS VIA INFUSION: 2.0000 mg | INTRAVENOUS | Status: DC | PRN

## 2024-01-08 MED ORDER — OXYCODONE HCL 5 MG PO TABS
5.0000 mg | ORAL_TABLET | ORAL | Status: DC | PRN
  Administered 2024-01-09 – 2024-01-10 (×2): 5 mg via ORAL
  Filled 2024-01-08 (×2): qty 1

## 2024-01-08 MED ORDER — FENTANYL CITRATE (PF) 250 MCG/5ML IJ SOLN
INTRAMUSCULAR | Status: AC
Start: 2024-01-08 — End: 2024-01-08
  Filled 2024-01-08: qty 5

## 2024-01-08 MED ORDER — SODIUM CHLORIDE 0.9 % IR SOLN
Status: DC | PRN
Start: 2024-01-08 — End: 2024-01-09
  Administered 2024-01-08 (×3): 3000 mL

## 2024-01-08 MED ORDER — IOHEXOL 350 MG/ML SOLN
75.0000 mL | Freq: Once | INTRAVENOUS | Status: AC | PRN
  Administered 2024-01-08: 75 mL via INTRAVENOUS

## 2024-01-08 MED ORDER — LIDOCAINE 2% (20 MG/ML) 5 ML SYRINGE
INTRAMUSCULAR | Status: DC | PRN
  Administered 2024-01-08: 80 mg via INTRAVENOUS

## 2024-01-08 MED ORDER — CHLORHEXIDINE GLUCONATE 4 % EX SOLN
60.0000 mL | Freq: Once | CUTANEOUS | Status: AC
  Administered 2024-01-08: 4 via TOPICAL

## 2024-01-08 MED ORDER — 0.9 % SODIUM CHLORIDE (POUR BTL) OPTIME
TOPICAL | Status: DC | PRN
  Administered 2024-01-08 (×2): 1000 mL

## 2024-01-08 MED ORDER — FENTANYL CITRATE (PF) 250 MCG/5ML IJ SOLN
INTRAMUSCULAR | Status: DC | PRN
  Administered 2024-01-08: 50 ug via INTRAVENOUS
  Administered 2024-01-08 (×4): 100 ug via INTRAVENOUS
  Administered 2024-01-09: 50 ug via INTRAVENOUS

## 2024-01-08 MED ORDER — LACTATED RINGERS IV BOLUS
1000.0000 mL | Freq: Once | INTRAVENOUS | Status: AC
  Administered 2024-01-08: 1000 mL via INTRAVENOUS

## 2024-01-08 MED ORDER — DEXAMETHASONE SODIUM PHOSPHATE 10 MG/ML IJ SOLN
INTRAMUSCULAR | Status: AC
  Filled 2024-01-08: qty 1

## 2024-01-08 MED ORDER — HYDROMORPHONE HCL 1 MG/ML IJ SOLN
INTRAMUSCULAR | Status: DC | PRN
  Administered 2024-01-08: .5 mg via INTRAVENOUS

## 2024-01-08 MED ORDER — CEFAZOLIN SODIUM-DEXTROSE 2-4 GM/100ML-% IV SOLN
2.0000 g | INTRAVENOUS | Status: AC
  Administered 2024-01-08: 2 g via INTRAVENOUS
  Filled 2024-01-08: qty 100

## 2024-01-08 MED ORDER — ONDANSETRON HCL 4 MG/2ML IJ SOLN
INTRAMUSCULAR | Status: AC
  Filled 2024-01-08: qty 2

## 2024-01-08 MED ORDER — PROPOFOL 10 MG/ML IV BOLUS
INTRAVENOUS | Status: AC
  Filled 2024-01-08: qty 20

## 2024-01-08 MED ORDER — EPHEDRINE 5 MG/ML INJ
INTRAVENOUS | Status: AC
  Filled 2024-01-08: qty 5

## 2024-01-08 MED ORDER — ALBUMIN HUMAN 5 % IV SOLN: INTRAVENOUS | Status: DC | PRN

## 2024-01-08 MED ORDER — ROCURONIUM BROMIDE 10 MG/ML (PF) SYRINGE
PREFILLED_SYRINGE | INTRAVENOUS | Status: DC | PRN
  Administered 2024-01-08: 60 mg via INTRAVENOUS
  Administered 2024-01-08: 20 mg via INTRAVENOUS

## 2024-01-08 MED ORDER — ONDANSETRON HCL 4 MG/2ML IJ SOLN
INTRAMUSCULAR | Status: DC | PRN
  Administered 2024-01-08: 4 mg via INTRAVENOUS

## 2024-01-08 MED ORDER — SUCCINYLCHOLINE CHLORIDE 200 MG/10ML IV SOSY
PREFILLED_SYRINGE | INTRAVENOUS | Status: AC
Start: 2024-01-08 — End: 2024-01-08
  Filled 2024-01-08: qty 10

## 2024-01-08 MED ORDER — LACTATED RINGERS IV SOLN: INTRAVENOUS | Status: DC

## 2024-01-08 SURGICAL SUPPLY — 30 items
BAG COUNTER SPONGE SURGICOUNT (BAG) ×2 IMPLANT
BNDG ELASTIC 4INX 5YD STR LF (GAUZE/BANDAGES/DRESSINGS) IMPLANT
BNDG ELASTIC 6X10 VLCR STRL LF (GAUZE/BANDAGES/DRESSINGS) IMPLANT
BNDG GAUZE DERMACEA FLUFF 4 (GAUZE/BANDAGES/DRESSINGS) ×2 IMPLANT
CANISTER WOUND CARE 500ML ATS (WOUND CARE) IMPLANT
CASSETTE VERAFLO VERALINK (MISCELLANEOUS) IMPLANT
CNTNR URN SCR LID CUP LEK RST (MISCELLANEOUS) IMPLANT
COVER SURGICAL LIGHT HANDLE (MISCELLANEOUS) ×2 IMPLANT
DRAPE INCISE IOBAN 66X45 STRL (DRAPES) IMPLANT
DRAPE SURG ORHT 6 SPLT 77X108 (DRAPES) IMPLANT
DURAPREP 26ML APPLICATOR (WOUND CARE) ×2 IMPLANT
ELECT CAUTERY BLADE 6.4 (BLADE) IMPLANT
FACESHIELD WRAPAROUND OR TEAM (MASK) IMPLANT
GLOVE BIO SURGEON STRL SZ8.5 (GLOVE) IMPLANT
GLOVE BIOGEL PI IND STRL 7.5 (GLOVE) IMPLANT
GLOVE BIOGEL PI IND STRL 8.5 (GLOVE) ×2 IMPLANT
GLOVE SS BIOGEL STRL SZ 7 (GLOVE) IMPLANT
GOWN STRL REUS W/ TWL LRG LVL3 (GOWN DISPOSABLE) ×2 IMPLANT
GOWN STRL REUS W/TWL 2XL LVL3 (GOWN DISPOSABLE) IMPLANT
KIT BASIN OR (CUSTOM PROCEDURE TRAY) ×2 IMPLANT
MANIFOLD NEPTUNE II (INSTRUMENTS) ×2 IMPLANT
PACK ORTHO EXTREMITY (CUSTOM PROCEDURE TRAY) ×2 IMPLANT
PAD CAST 4YDX4 CTTN HI CHSV (CAST SUPPLIES) ×2 IMPLANT
PADDING CAST SYNTHETIC 4X4 STR (CAST SUPPLIES) IMPLANT
PENCIL BUTTON HOLSTER BLD 10FT (ELECTRODE) IMPLANT
SET CYSTO W/LG BORE CLAMP LF (SET/KITS/TRAYS/PACK) IMPLANT
SET HNDPC FAN SPRY TIP SCT (DISPOSABLE) ×2 IMPLANT
SPONGE T-LAP 18X18 ~~LOC~~+RFID (SPONGE) IMPLANT
SYR CONTROL 10ML LL (SYRINGE) ×2 IMPLANT
TOWEL GREEN STERILE (TOWEL DISPOSABLE) ×4 IMPLANT

## 2024-01-08 NOTE — Progress Notes (Signed)
 Pt received from [facility], alert and appropriate.  Pt is cuffed to the bed and 2 uniformed officers are present.  RLE swollen.  VS taken and CCMD notified.  Plans for I&D later today.

## 2024-01-08 NOTE — Plan of Care (Signed)
  Problem: Clinical Measurements: Goal: Ability to avoid or minimize complications of infection will improve Outcome: Progressing   Problem: Skin Integrity: Goal: Skin integrity will improve Outcome: Progressing   Problem: Education: Goal: Knowledge of General Education information will improve Description: Including pain rating scale, medication(s)/side effects and non-pharmacologic comfort measures Outcome: Progressing   Problem: Clinical Measurements: Goal: Ability to maintain clinical measurements within normal limits will improve Outcome: Progressing   Problem: Coping: Goal: Level of anxiety will decrease Outcome: Progressing   Problem: Pain Managment: Goal: General experience of comfort will improve and/or be controlled Outcome: Progressing   Problem: Safety: Goal: Ability to remain free from injury will improve Outcome: Progressing

## 2024-01-08 NOTE — Progress Notes (Addendum)
 HD#1 SUBJECTIVE:  Patient Summary: Robert Eaton is a 38 y.o. with a pertinent PMH of COPD, polysubstance abuse, abuse disorder on Suboxone , alcohol use disorder without known withdrawal, and major depressive disorder, who presented with right lower extremity edema and jaundice and admitted for sepsis 2/2 right lower extermity cellulitis with hyperbilirubinemia and severe leukocytosis.   Overnight Events: 1L LR bolus due to hypotensive episode. Another lactic acid drawn  Interim History: Patient evaluated at bedside. He states he has had no changes overnight regarding his symptoms, but notes that his leg hurts still. Notes that he thinks his wound was worsening. He noticed swelling that had started about a week ago before he noticed his leg pain. Denies chills or fever, shortness of breath, chest pain, or new headache. Did inquire about pain medications for his worsening leg pain.   OBJECTIVE:  Vital Signs: Vitals:   01/08/24 0511 01/08/24 0909 01/08/24 1136 01/08/24 1320  BP: (!) 94/51 (!) 99/56 99/62 102/72  Pulse: 98 97 100   Resp: 18 16 16    Temp: 99.2 F (37.3 C) 98.5 F (36.9 C) (!) 100.6 F (38.1 C) 99.1 F (37.3 C)  TempSrc: Oral  Oral Oral  SpO2: 98% 96% 100% 99%  Weight:      Height:       Supplemental O2: Room Air SpO2: 99 %  Filed Weights   01/07/24 1300  Weight: 79.4 kg     Intake/Output Summary (Last 24 hours) at 01/08/2024 1418 Last data filed at 01/08/2024 0900 Gross per 24 hour  Intake 2590.6 ml  Output 800 ml  Net 1790.6 ml   Net IO Since Admission: 1,790.6 mL [01/08/24 1418]  Physical Exam: Physical Exam Constitutional:      General: He is not in acute distress.    Appearance: He is ill-appearing. He is not toxic-appearing.     Comments: jaundiced  HENT:     Head: Normocephalic and atraumatic.  Eyes:     Extraocular Movements: Extraocular movements intact.     Pupils: Pupils are equal, round, and reactive to light.  Cardiovascular:     Rate  and Rhythm: Regular rhythm. Tachycardia present.  Pulmonary:     Effort: Pulmonary effort is normal.     Breath sounds: Normal breath sounds.  Abdominal:     General: Abdomen is flat. Bowel sounds are normal.     Palpations: Abdomen is soft.     Comments: Positive hepatomegaly  Skin:    General: Skin is dry.     Coloration: Skin is jaundiced.     Comments: Erythema to the right lower extremity with draining wound on the medial aspect of his calf. Hot to the touch, edematous. Weeping bullae on wound with skin breakdown surrounding the area of erythema. Edema extends to knee and encompasses right foot. No streaking noted.  Neurological:     General: No focal deficit present.     Mental Status: He is alert and oriented to person, place, and time.  Psychiatric:        Mood and Affect: Mood normal.     Patient Lines/Drains/Airways Status     Active Line/Drains/Airways     Name Placement date Placement time Site Days   Peripheral IV 01/07/24 20 G Posterior;Right Hand 01/07/24  1254  Hand  1   Peripheral IV 01/07/24 18 G Anterior;Distal;Left;Upper Arm 01/07/24  1317  Arm  1            Pertinent labs and imaging:  Latest Ref Rng & Units 01/08/2024    6:22 AM 01/07/2024    1:20 PM 09/18/2023    6:00 AM  CBC  WBC 4.0 - 10.5 K/uL 43.7  54.3  7.4   Hemoglobin 13.0 - 17.0 g/dL 89.8  87.6  86.5   Hematocrit 39.0 - 52.0 % 28.2  34.9  42.5   Platelets 150 - 400 K/uL 244  262  297        Latest Ref Rng & Units 01/08/2024    6:22 AM 01/07/2024    5:53 PM 01/07/2024    1:20 PM  CMP  Glucose 70 - 99 mg/dL 890   877   BUN 6 - 20 mg/dL 8   11   Creatinine 9.38 - 1.24 mg/dL 9.21   9.05   Sodium 864 - 145 mmol/L 130  127  126   Potassium 3.5 - 5.1 mmol/L 3.9   3.4   Chloride 98 - 111 mmol/L 97   89   CO2 22 - 32 mmol/L 28   25   Calcium 8.9 - 10.3 mg/dL 7.7   8.1   Total Protein 6.5 - 8.1 g/dL 4.9  4.5  6.4   Total Bilirubin 0.0 - 1.2 mg/dL 4.3  5.6  5.6   Alkaline Phos 38 - 126  U/L 122  123  141   AST 15 - 41 U/L 108  156  153   ALT 0 - 44 U/L 92  121  140     CT ABDOMEN PELVIS W CONTRAST Addendum Date: 01/08/2024 ADDENDUM REPORT: 01/08/2024 11:29 ADDENDUM: I was informed that the patient has significant cellulitis in the right lower extremity. In light of new history, the right inguinal adenopathy is likely reactive and related to the infectious process. Clinical correlation and follow-up recommended. Electronically Signed   By: Vanetta Chou M.D.   On: 01/08/2024 11:29   Result Date: 01/08/2024 CLINICAL DATA:  Abdominal pain.  Jaundice. EXAM: CT ABDOMEN AND PELVIS WITH CONTRAST TECHNIQUE: Multidetector CT imaging of the abdomen and pelvis was performed using the standard protocol following bolus administration of intravenous contrast. RADIATION DOSE REDUCTION: This exam was performed according to the departmental dose-optimization program which includes automated exposure control, adjustment of the mA and/or kV according to patient size and/or use of iterative reconstruction technique. CONTRAST:  75mL OMNIPAQUE  IOHEXOL  350 MG/ML SOLN COMPARISON:  None Available. FINDINGS: Lower chest: The visualized lung bases are clear. No intra-abdominal free air or free fluid. Hepatobiliary: Fatty liver. No biliary dilatation. Gallstones. No pericholecystic fluid or evidence of acute cholecystitis by CT. Pancreas: Unremarkable. No pancreatic ductal dilatation or surrounding inflammatory changes. Spleen: Normal in size without focal abnormality. Adrenals/Urinary Tract: The adrenal glands unremarkable. Nonobstructing right renal inferior pole calculi measure up to 2 mm. No hydronephrosis. The left kidney is unremarkable. The visualized ureters and urinary bladder appear unremarkable. Stomach/Bowel: There is no bowel obstruction or active inflammation. The appendix is normal. Vascular/Lymphatic: Mild aortoiliac atherosclerotic disease. The IVC is unremarkable. Right iliac chain and inguinal  adenopathy measure up to 2.1 cm in short axis concerning for malignancy. Several enlarged lymph nodes in the proximal right thigh. There is mild edema in the superficial soft tissues of the right thigh. Reproductive: The prostate and seminal vesicles are grossly unremarkable. Other: None Musculoskeletal: Partially visualized right femoral nail. No acute osseous pathology. IMPRESSION: 1. Right iliac chain and inguinal adenopathy concerning for malignancy. 2. Fatty liver. 3. Cholelithiasis. 4. Nonobstructing right renal inferior pole calculi. No hydronephrosis.  5.  Aortic Atherosclerosis (ICD10-I70.0). Electronically Signed: By: Vanetta Chou M.D. On: 01/07/2024 15:36   CT TIBIA FIBULA RIGHT W CONTRAST Result Date: 01/08/2024 CLINICAL DATA:  Right lower extremity edema and cellulitis. EXAM: CT OF THE LOWER RIGHT EXTREMITY WITH CONTRAST TECHNIQUE: Multidetector CT imaging of the lower right extremity was performed according to the standard protocol following intravenous contrast administration. RADIATION DOSE REDUCTION: This exam was performed according to the departmental dose-optimization program which includes automated exposure control, adjustment of the mA and/or kV according to patient size and/or use of iterative reconstruction technique. CONTRAST:  75mL OMNIPAQUE  IOHEXOL  350 MG/ML SOLN COMPARISON:  Right tibia and fibula radiographs dated 01/07/2024. FINDINGS: Bones/Joint/Cartilage No acute fracture or dislocation. Partially visualized intramedullary nail and distal interlocking screws within the distal femur. Visualized hardware is intact without evidence of significant periprosthetic lucency. Mild medial femorotibial compartment joint space narrowing. No significant joint effusion. Ankle mortise is congruent. Ligaments Ligaments are suboptimally evaluated by CT. Soft tissue/Muscles and Tendons Diffuse circumferential subcutaneous edema and stranding with overlying cutaneous thickening extending from the  knee through the ankle and into the dorsal foot. No soft tissue gas. There is a hypodense fluid collection extending along the undersurface of the investing layer of the deep fascia of the posterior compartment musculature, most pronounced overlying the medial head of the gastrocnemius muscle. This curvilinear fluid collection measures up to 1.6 cm TR by 7.5 cm AP (series 4, images 120 and 160) and 22 cm in craniocaudal extent (series 9, image 69). Fluid extends into the deep intramuscular fascial planes of the posterior compartment. IMPRESSION: 1. Cellulitis extending from the knee through the ankle and into the dorsal foot. No soft tissue gas. There is a hypodense fluid collection extending along the undersurface of the investing layer of the deep fascia of the posterior compartment musculature from the proximal to distal calf, most pronounced overlying the medial head of the gastrocnemius muscle. Fluid extends into the deep intramuscular fascial planes of the posterior compartment. These findings are concerning for fasciitis. 2. No acute osseous abnormality. Electronically Signed   By: Harrietta Sherry M.D.   On: 01/08/2024 11:24   CT FOOT RIGHT W CONTRAST Result Date: 01/08/2024 CLINICAL DATA:  Right lower extremity edema and cellulitis. EXAM: CT OF THE LOWER RIGHT EXTREMITY WITH CONTRAST TECHNIQUE: Multidetector CT imaging of the lower right extremity was performed according to the standard protocol following intravenous contrast administration. RADIATION DOSE REDUCTION: This exam was performed according to the departmental dose-optimization program which includes automated exposure control, adjustment of the mA and/or kV according to patient size and/or use of iterative reconstruction technique. CONTRAST:  75mL OMNIPAQUE  IOHEXOL  350 MG/ML SOLN COMPARISON:  Right tibia and fibula radiographs dated 01/07/2024. FINDINGS: Bones/Joint/Cartilage No acute fracture or dislocation. Partially visualized  intramedullary nail and distal interlocking screws within the distal femur. Visualized hardware is intact without evidence of significant periprosthetic lucency. Mild medial femorotibial compartment joint space narrowing. No significant joint effusion. Ankle mortise is congruent. Ligaments Ligaments are suboptimally evaluated by CT. Soft tissue/Muscles and Tendons Diffuse circumferential subcutaneous edema and stranding with overlying cutaneous thickening extending from the knee through the ankle and into the dorsal foot. No soft tissue gas. There is a hypodense fluid collection extending along the undersurface of the investing layer of the deep fascia of the posterior compartment musculature, most pronounced overlying the medial head of the gastrocnemius muscle. This curvilinear fluid collection measures up to 1.6 cm TR by 7.5 cm AP (series 4, images 120 and  160) and 22 cm in craniocaudal extent (series 9, image 69). Fluid extends into the deep intramuscular fascial planes of the posterior compartment. IMPRESSION: 1. Cellulitis extending from the knee through the ankle and into the dorsal foot. No soft tissue gas. There is a hypodense fluid collection extending along the undersurface of the investing layer of the deep fascia of the posterior compartment musculature from the proximal to distal calf, most pronounced overlying the medial head of the gastrocnemius muscle. Fluid extends into the deep intramuscular fascial planes of the posterior compartment. These findings are concerning for fasciitis. 2. No acute osseous abnormality. Electronically Signed   By: Harrietta Sherry M.D.   On: 01/08/2024 11:24    ASSESSMENT/PLAN:  Assessment: Principal Problem:   Cellulitis of right lower extremity Active Problems:   Polysubstance abuse (HCC)   Alcohol use disorder   Opioid use disorder  Robert Eaton is a 37 y.o. with a pertinent PMH of COPD, polysubstance abuse, abuse disorder on Suboxone , alcohol use disorder  without known withdrawal, and major depressive disorder, who presented with right lower extremity edema and jaundice and admitted for sepsis 2/2 right lower extermity cellulitis with hyperbilirubinemia and severe leukocytosis.   Plan: #Sepsis Right lower extremity cellulitis Unknown nidus for infection and imaging so far shows no signs of abscess or gas-producing organisms. Given Linezolid  and Zosyn  in ED. Lactic trending down from 4 > 2.1. BNP normal 96.9. UA normal. 3rd lactic from overnight improved to 1.4.  Severe leukocytosis of 54, peripheral smear was drawn and normal. Fever has resolved and tachycardia improved after IV fluids and starting antibiotics. Pending blood cultures. RLE on exam today showed weeping bulla, worsening of infection.  Concerned for necrotizing fasciitis.  Ordered CT right knee and CT right tibia-fibula, as well as MRI right femur. CT foot and tibia-fibula are concerning for fasciitis, showing hypodense fluid collection extending along the undersurface of the deep fascia of the posterior compartment musculature from proximal to distal calf.  Fluid extends deep into the intramuscular fascial planes of the posterior compartment. MR right femur obtained to rule out previous hardware involvement. Consulted orthopedic surgery and general surgery for further recommendations. Orthopedic surgery agreed to evaluate the patient and take him to surgery tonight for incision and drainage. Updating patient's status to progressive unit as he is becoming increasingly ill.  Will continue to monitor blood pressure and rebolus if he remains hypotensive. -Continue IV Linezolid  and Zosyn  -Pending blood cultures -Closely monitor fevers, daily CBC -Orthopedic surgery tonight -Pending MRI right femur for hardware evaluation  #Severe leukocytosis WBC 54.3 in the setting of severe infection and likely leukemoid reaction. Shows predominantly neutrophils. No signs of leukostasis or history of AML/ALL.  No blasts noted on smear. No respiratory distress, neurologic deficits, or evidence of multiorgan failure although he has hyperbilirubinemia with mild liver enzyme elevation. LDH 271 but showed signs of hemolysis.  White blood cell count improved today to 43.7. -Daily CBCs -Pending flow cytometry of peripheral blood to r/o malignancy  #Hyperbilirubinemia Transaminitis Bilirubin elevated at 5.6 and patient has jaundice. AST and ALT moderately elevated at 153 and 140 respectively. Alkaline phosphatase at 141. Lipase normal. Ammonia elevated to 38. Hepatitis panel normal. CT scan shows no signs of biliary obstruction on but there is cholelithiasis. No history of known liver failure or cirrhosis. No evidence of ascending cholangitis. Platelets currently normal without history of thrombocytopenia. LDH elevated at 271 with no significant new anemia with hemoglobin 10.1. Bilirubin is downtrending, as well as liver enzymes.  Concern for alcoholic hepatitis.  Scores 31.9 on Maddrey's discriminant function for alcoholic hepatitis calculator.  Consulted GI and pending recommendations.  -repeat hepatic function panel  -GI to evaluate  Hyponatremia Sodium on admission 126. Slightly improved to 127 after 2L of IV fluids. Na 130 today. Likely in setting of sepsis and leukemoid reaction.  -Trend BMP  Iliac and inguinal adenopathy Seen on CT scan and worrisome for malignancy. No personal history of malignancy but has family history of breast/lung cancer in his mother and colon cancer in paternal uncle. He has a strong smoking history but no obvious abnormalities on CXR.  Spoke with radiology about patient's extensive infection to rule out if lymphadenopathy is likely reactive. Radiology agrees, recommends follow-up CT abdomen and pelvis to ensure no malignant cause after acute infection resolves during this admission.   Microcytosis Significant microcytosis with MCV of 71.5 and slightly elevated RDW. No abnormal  morphology on peripheral smear.  Opiate Use Disorder Continue prior suboxone  dose 8-2mg  TID.  Given extensive pain from right calf cellulitis, will give patient oxycodone  5 mg every 4 hours as needed. May need higher doses of pain medication given concomitant Suboxone  use.  COPD Continue albuterol  PRN. No signs of respiratory distress or COPD exacerbation.    Best Practice: Diet: Regular diet IVF: Fluids: LR, Rate: 1000 cc bolus VTE: enoxaparin  (LOVENOX ) injection 40 mg Start: 01/07/24 1800 Code: Full  Disposition planning: Therapy Recs: None, DME: none Family Contact: dad DISPO: Anticipated discharge in 2-3 days to [facility] pending clinical improvement.  Signature:  [facility]  2:18 PM, 01/08/2024  On Call pager [phone]
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
## 2024-01-08 NOTE — Consult Note (Signed)
 Referring Provider: Teaching service Primary Care Physician:  Pcp, No Primary Gastroenterologist: [place]  Reason for Consultation: Elevated LFTs/jaundice  HPI: Robert Eaton is a 38 y.o. male with a past medical history of COPD, polysubstance abuse, abuse disorder on Suboxone , alcohol use disorder without known withdrawal, and MDD/GAD who presents from jail with 1 week of general malaise and right lower extremity swelling and pain now accompanied by 1 day of jaundice.  Extensive imaging suggestive of fasciitis.  Plan is to go to the OR this evening for I&D.  He is on Zosyn .  GI has been consulted for elevated LFTs/jaundice.  Negative acute hepatitis panel.  LFTs noted to be normal in April 2025.  Today AST is 108, ALT 92, alk phos 122, total bili 4.3.  These are down from admission yesterday.  No abdominal pain.  He does have acid reflux and says that he was not getting his acid reflux medication in jail.  Patient did not really notice the jaundice.  Main complaint is pain in his right leg.  No history of liver disease.  CT scan of abdomen and pelvis with contrast:  IMPRESSION: 1. Right iliac chain and inguinal adenopathy concerning for malignancy. 2. Fatty liver. 3. Cholelithiasis. 4. Nonobstructing right renal inferior pole calculi. No hydronephrosis. 5.  Aortic Atherosclerosis (ICD10-I70.0).  Past Medical History:  Diagnosis Date   COPD (chronic obstructive pulmonary disease) (HCC)    Irregular heartbeat     Past Surgical History:  Procedure Laterality Date   LEG SURGERY Right     Prior to Admission medications   Medication Sig Start Date End Date Taking? Authorizing Provider  albuterol  (VENTOLIN  HFA) 108 (90 Base) MCG/ACT inhaler Inhale 1-2 puffs into the lungs every 6 (six) hours as needed for wheezing or shortness of breath. 03/25/21  Yes Fondaw, Wylder S, PA  buprenorphine  (SUBUTEX ) 8 MG SUBL SL tablet Place 24 mg under the tongue daily.   Yes [provider]   pantoprazole  (PROTONIX ) 20 MG tablet Take 40 mg by mouth daily.   Yes [provider]  Buprenorphine  HCl-Naloxone  HCl (SUBOXONE ) 8-2 MG FILM Place 1 Film under the tongue 3 (three) times daily. Patient not taking: Reported on 01/07/2024    [provider]  nicotine  polacrilex (NICORETTE ) 2 MG gum Take 1 each (2 mg total) by mouth as needed for smoking cessation. Patient not taking: Reported on 01/07/2024 09/21/23   Ntuen, Tina C, FNP  pantoprazole  (PROTONIX ) 40 MG tablet Take 1 tablet (40 mg total) by mouth daily. Patient not taking: Reported on 01/07/2024 09/22/23   Ntuen, Tina C, FNP    Current Facility-Administered Medications  Medication Dose Route Frequency Provider Last Rate Last Admin   acetaminophen  (TYLENOL ) tablet 650 mg  650 mg Oral Q6H PRN Waymond Cart, MD   650 mg at 01/07/24 2129   albuterol  (PROVENTIL ) (2.5 MG/3ML) 0.083% nebulizer solution 2.5 mg  2.5 mg Inhalation Q4H PRN Jolaine Pac, DO       buprenorphine -naloxone  (SUBOXONE ) 8-2 mg per SL tablet 1 tablet  1 tablet Sublingual TID Jolaine Pac, DO   1 tablet at 01/08/24 9041   ceFAZolin  (ANCEF ) IVPB 2g/100 mL premix  2 g Intravenous On Call to OR Juliane Ozell PARAS, PA-C       chlorhexidine  (HIBICLENS ) 4 % liquid 4 Application  60 mL Topical Once Jeffery, Michael J, PA-C       enoxaparin  (LOVENOX ) injection 40 mg  40 mg Subcutaneous Q24H Jolaine Pac, DO   40 mg at  01/07/24 1814   linezolid  (ZYVOX ) IVPB 600 mg  600 mg Intravenous Q12H Jolaine Pac, DO 300 mL/hr at 01/08/24 0655 600 mg at 01/08/24 9344   oxyCODONE  (Oxy IR/ROXICODONE ) immediate release tablet 5 mg  5 mg Oral Q4H PRN Nguyen, Diana, MD       pantoprazole  (PROTONIX ) EC tablet 40 mg  40 mg Oral Daily Jolaine Pac, DO   40 mg at 01/08/24 9041   piperacillin -tazobactam (ZOSYN ) IVPB 3.375 g  3.375 g Intravenous Q8H Sherryll Suzen SQUIBB, RPH 12.5 mL/hr at 01/08/24 0958 3.375 g at 01/08/24 9041   povidone-iodine  10 % swab 2 Application  2  Application Topical Once Jeffery, Michael J, PA-C        Allergies as of 01/07/2024   (No Known Allergies)    History reviewed. No pertinent family history.  Social History   Socioeconomic History   Marital status: Single    Spouse name: Not on file   Number of children: Not on file   Years of education: Not on file   Highest education level: Not on file  Occupational History   Not on file  Tobacco Use   Smoking status: Every Day    Current packs/day: 1.50    Types: Cigarettes    Passive exposure: Current   Smokeless tobacco: Never  Substance and Sexual Activity   Alcohol use: Yes    Comment: several beers per day   Drug use: Yes    Types: Marijuana   Sexual activity: Not Currently  Other Topics Concern   Not on file  Social History Narrative   Not on file   Social Drivers of Health   Financial Resource Strain: Not on file  Food Insecurity: Unknown (01/08/2024)   Hunger Vital Sign    Worried About Running Out of Food in the Last Year: Never true    Ran Out of Food in the Last Year: Not on file  Transportation Needs: Unknown (01/08/2024)   PRAPARE - Administrator, Civil Service (Medical): No    Lack of Transportation (Non-Medical): Not on file  Physical Activity: Not on file  Stress: Not on file  Social Connections: Not on file  Intimate Partner Violence: Unknown (01/08/2024)   Humiliation, Afraid, Rape, and Kick questionnaire    Fear of Current or Ex-Partner: No    Emotionally Abused: Not on file    Physically Abused: Not on file    Sexually Abused: Not on file    Review of Systems: ROS is O/W negative except as mentioned in HPI.  Physical Exam: Vital signs in last 24 hours: Temp:  [98.3 F (36.8 C)-101.8 F (38.8 C)] 99.1 F (37.3 C) (07/31 1320) Pulse Rate:  [60-104] 100 (07/31 1136) Resp:  [16-19] 16 (07/31 1136) BP: (78-134)/(47-103) 102/72 (07/31 1320) SpO2:  [93 %-100 %] 99 % (07/31 1320)   General:  Alert, Well-developed,  well-nourished, pleasant and cooperative in NAD Head:  Normocephalic and atraumatic. Eyes:  Scleral icterus noted. Ears:  Normal auditory acuity. Mouth:  No deformity or lesions.   Lungs:  Clear throughout to auscultation.  No wheezes, crackles, or rhonchi. Heart:  Regular rate and rhythm; no murmurs, clicks, rubs,  or gallops. Abdomen:  Soft, non-distended.  BS present.  Non-tender. Msk:  Symmetrical without gross deformities. Pulses:  Normal pulses noted. Extremities:  RLE edema/erythema. Neurologic:  Alert and oriented x 4;  grossly normal neurologically. Skin:  Intact without significant lesions or rashes. Psych:  Alert and cooperative. Normal mood and  affect.  Intake/Output from previous day: 07/30 0701 - 07/31 0700 In: 2350.6 [IV Piggyback:2350.6] Out: 800 [Urine:800] Intake/Output this shift: Total I/O In: 240 [P.O.:240] Out: -   Lab Results: Recent Labs    01/07/24 1320 01/08/24 0622  WBC 54.3* 43.7*  HGB 12.3* 10.1*  HCT 34.9* 28.2*  PLT 262 244   BMET Recent Labs    01/07/24 1320 01/07/24 1753 01/08/24 0622  NA 126* 127* 130*  K 3.4*  --  3.9  CL 89*  --  97*  CO2 25  --  28  GLUCOSE 122*  --  109*  BUN 11  --  8  CREATININE 0.94  --  0.78  CALCIUM 8.1*  --  7.7*   LFT Recent Labs    01/08/24 0622  PROT 4.9*  ALBUMIN  <1.5*  AST 108*  ALT 92*  ALKPHOS 122  BILITOT 4.3*  BILIDIR 2.9*  IBILI 1.4*   PT/INR Recent Labs    01/07/24 1320  LABPROT 18.0*  INR 1.4*   Hepatitis Panel Recent Labs    01/07/24 1320  HEPBSAG NON REACTIVE  HCVAB NON REACTIVE  HEPAIGM NON REACTIVE  HEPBIGM NON REACTIVE    Studies/Results: CT ABDOMEN PELVIS W CONTRAST Addendum Date: 01/08/2024 ADDENDUM REPORT: 01/08/2024 11:29 ADDENDUM: I was informed that the patient has significant cellulitis in the right lower extremity. In light of new history, the right inguinal adenopathy is likely reactive and related to the infectious process. Clinical correlation and  follow-up recommended. Electronically Signed   By: Vanetta Chou M.D.   On: 01/08/2024 11:29   Result Date: 01/08/2024 CLINICAL DATA:  Abdominal pain.  Jaundice. EXAM: CT ABDOMEN AND PELVIS WITH CONTRAST TECHNIQUE: Multidetector CT imaging of the abdomen and pelvis was performed using the standard protocol following bolus administration of intravenous contrast. RADIATION DOSE REDUCTION: This exam was performed according to the departmental dose-optimization program which includes automated exposure control, adjustment of the mA and/or kV according to patient size and/or use of iterative reconstruction technique. CONTRAST:  75mL OMNIPAQUE  IOHEXOL  350 MG/ML SOLN COMPARISON:  None Available. FINDINGS: Lower chest: The visualized lung bases are clear. No intra-abdominal free air or free fluid. Hepatobiliary: Fatty liver. No biliary dilatation. Gallstones. No pericholecystic fluid or evidence of acute cholecystitis by CT. Pancreas: Unremarkable. No pancreatic ductal dilatation or surrounding inflammatory changes. Spleen: Normal in size without focal abnormality. Adrenals/Urinary Tract: The adrenal glands unremarkable. Nonobstructing right renal inferior pole calculi measure up to 2 mm. No hydronephrosis. The left kidney is unremarkable. The visualized ureters and urinary bladder appear unremarkable. Stomach/Bowel: There is no bowel obstruction or active inflammation. The appendix is normal. Vascular/Lymphatic: Mild aortoiliac atherosclerotic disease. The IVC is unremarkable. Right iliac chain and inguinal adenopathy measure up to 2.1 cm in short axis concerning for malignancy. Several enlarged lymph nodes in the proximal right thigh. There is mild edema in the superficial soft tissues of the right thigh. Reproductive: The prostate and seminal vesicles are grossly unremarkable. Other: None Musculoskeletal: Partially visualized right femoral nail. No acute osseous pathology. IMPRESSION: 1. Right iliac chain and  inguinal adenopathy concerning for malignancy. 2. Fatty liver. 3. Cholelithiasis. 4. Nonobstructing right renal inferior pole calculi. No hydronephrosis. 5.  Aortic Atherosclerosis (ICD10-I70.0). Electronically Signed: By: Vanetta Chou M.D. On: 01/07/2024 15:36   CT TIBIA FIBULA RIGHT W CONTRAST Result Date: 01/08/2024 CLINICAL DATA:  Right lower extremity edema and cellulitis. EXAM: CT OF THE LOWER RIGHT EXTREMITY WITH CONTRAST TECHNIQUE: Multidetector CT imaging of the lower right extremity  was performed according to the standard protocol following intravenous contrast administration. RADIATION DOSE REDUCTION: This exam was performed according to the departmental dose-optimization program which includes automated exposure control, adjustment of the mA and/or kV according to patient size and/or use of iterative reconstruction technique. CONTRAST:  75mL OMNIPAQUE  IOHEXOL  350 MG/ML SOLN COMPARISON:  Right tibia and fibula radiographs dated 01/07/2024. FINDINGS: Bones/Joint/Cartilage No acute fracture or dislocation. Partially visualized intramedullary nail and distal interlocking screws within the distal femur. Visualized hardware is intact without evidence of significant periprosthetic lucency. Mild medial femorotibial compartment joint space narrowing. No significant joint effusion. Ankle mortise is congruent. Ligaments Ligaments are suboptimally evaluated by CT. Soft tissue/Muscles and Tendons Diffuse circumferential subcutaneous edema and stranding with overlying cutaneous thickening extending from the knee through the ankle and into the dorsal foot. No soft tissue gas. There is a hypodense fluid collection extending along the undersurface of the investing layer of the deep fascia of the posterior compartment musculature, most pronounced overlying the medial head of the gastrocnemius muscle. This curvilinear fluid collection measures up to 1.6 cm TR by 7.5 cm AP (series 4, images 120 and 160) and 22 cm in  craniocaudal extent (series 9, image 69). Fluid extends into the deep intramuscular fascial planes of the posterior compartment. IMPRESSION: 1. Cellulitis extending from the knee through the ankle and into the dorsal foot. No soft tissue gas. There is a hypodense fluid collection extending along the undersurface of the investing layer of the deep fascia of the posterior compartment musculature from the proximal to distal calf, most pronounced overlying the medial head of the gastrocnemius muscle. Fluid extends into the deep intramuscular fascial planes of the posterior compartment. These findings are concerning for fasciitis. 2. No acute osseous abnormality. Electronically Signed   By: Harrietta Sherry M.D.   On: 01/08/2024 11:24   CT FOOT RIGHT W CONTRAST Result Date: 01/08/2024 CLINICAL DATA:  Right lower extremity edema and cellulitis. EXAM: CT OF THE LOWER RIGHT EXTREMITY WITH CONTRAST TECHNIQUE: Multidetector CT imaging of the lower right extremity was performed according to the standard protocol following intravenous contrast administration. RADIATION DOSE REDUCTION: This exam was performed according to the departmental dose-optimization program which includes automated exposure control, adjustment of the mA and/or kV according to patient size and/or use of iterative reconstruction technique. CONTRAST:  75mL OMNIPAQUE  IOHEXOL  350 MG/ML SOLN COMPARISON:  Right tibia and fibula radiographs dated 01/07/2024. FINDINGS: Bones/Joint/Cartilage No acute fracture or dislocation. Partially visualized intramedullary nail and distal interlocking screws within the distal femur. Visualized hardware is intact without evidence of significant periprosthetic lucency. Mild medial femorotibial compartment joint space narrowing. No significant joint effusion. Ankle mortise is congruent. Ligaments Ligaments are suboptimally evaluated by CT. Soft tissue/Muscles and Tendons Diffuse circumferential subcutaneous edema and stranding  with overlying cutaneous thickening extending from the knee through the ankle and into the dorsal foot. No soft tissue gas. There is a hypodense fluid collection extending along the undersurface of the investing layer of the deep fascia of the posterior compartment musculature, most pronounced overlying the medial head of the gastrocnemius muscle. This curvilinear fluid collection measures up to 1.6 cm TR by 7.5 cm AP (series 4, images 120 and 160) and 22 cm in craniocaudal extent (series 9, image 69). Fluid extends into the deep intramuscular fascial planes of the posterior compartment. IMPRESSION: 1. Cellulitis extending from the knee through the ankle and into the dorsal foot. No soft tissue gas. There is a hypodense fluid collection extending along the undersurface of  the investing layer of the deep fascia of the posterior compartment musculature from the proximal to distal calf, most pronounced overlying the medial head of the gastrocnemius muscle. Fluid extends into the deep intramuscular fascial planes of the posterior compartment. These findings are concerning for fasciitis. 2. No acute osseous abnormality. Electronically Signed   By: Harrietta Sherry M.D.   On: 01/08/2024 11:24   DG Tibia/Fibula Right Result Date: 01/07/2024 CLINICAL DATA:  Leg swelling and pain. EXAM: RIGHT TIBIA AND FIBULA - 2 VIEW COMPARISON:  None Available. FINDINGS: There is no evidence of acute fracture. Partially visualized fixation hardware in the distal femur. Diffuse generalized subcutaneous edema and cutaneous thickening of the right lower extremity. No soft tissue gas. IMPRESSION: 1. Diffuse generalized subcutaneous edema and cutaneous thickening of the right lower extremity, which could reflect cellulitis in the appropriate clinical setting. No soft tissue gas. 2. No acute osseous abnormality. Electronically Signed   By: Harrietta Sherry M.D.   On: 01/07/2024 13:38   DG Chest Port 1 View Result Date: 01/07/2024 CLINICAL  DATA:  Concern for sepsis. EXAM: PORTABLE CHEST 1 VIEW COMPARISON:  01/11/2022. FINDINGS: The heart size and mediastinal contours are within normal limits. No focal consolidation, sizeable pleural effusion, or pneumothorax. No acute osseous abnormality. IMPRESSION: No acute cardiopulmonary findings. Electronically Signed   By: Harrietta Sherry M.D.   On: 01/07/2024 13:37    IMPRESSION:  *Transaminitis/hyperbilirubinemia: LFTs normal 3 months ago.  Elevated total bili of 5.6, ALT 140, AST 153, alk phos 141 yesterday.  LFTs have trended down since then.  Negative hepatitis panel.  CT scan with contrast shows fatty liver, no biliary ductal dilatation.  Suspect that this is related to sepsis with his right lower extremity fasciitis.  He previously had extensive alcohol use, but has been in jail for 2 weeks and LFTs are downtrending.  No known history of liver disease.  No abdominal pain. *Right lower extremity cellulitis/fasciitis: Suspect I&D in the OR this evening.  On Zosyn . *History of polysubstance abuse on Suboxone .  Also history of alcohol abuse.  PLAN: - With LFTs downtrending would just continue to monitor for now. - Further recs per Dr. Stacia.  Harlene D. Petina Muraski  01/08/2024, 2:25 PM
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
## 2024-01-08 NOTE — Anesthesia Procedure Notes (Signed)
 Procedure Name: Intubation Date/Time: 01/08/2024 10:37 PM  Performed by: Terrence Pizana T, CRNAPre-anesthesia Checklist: Patient identified, Emergency Drugs available, Suction available and Patient being monitored Patient Re-evaluated:Patient Re-evaluated prior to induction Oxygen Delivery Method: Circle system utilized Preoxygenation: Pre-oxygenation with 100% oxygen Induction Type: IV induction Ventilation: Mask ventilation without difficulty Laryngoscope Size: Miller and 2 Grade View: Grade II Tube type: Oral Tube size: 7.5 mm Number of attempts: 1 Airway Equipment and Method: Stylet and Oral airway Placement Confirmation: ETT inserted through vocal cords under direct vision, positive ETCO2 and breath sounds checked- equal and bilateral Secured at: 22 cm Tube secured with: Tape Dental Injury: Teeth and Oropharynx as per pre-operative assessment

## 2024-01-08 NOTE — Anesthesia Preprocedure Evaluation (Addendum)
 Anesthesia Evaluation  Patient identified by MRN, date of birth, ID band Patient awake    Reviewed: Allergy & Precautions, NPO status , Patient's Chart, lab work & pertinent test results  Airway Mallampati: II  TM Distance: >3 FB Neck ROM: Full    Dental no notable dental hx.    Pulmonary COPD, Current Smoker and Patient abstained from smoking.   Pulmonary exam normal        Cardiovascular negative cardio ROS  Rhythm:Regular Rate:Normal     Neuro/Psych negative neurological ROS     GI/Hepatic ,GERD  ,,(+)     substance abuse  alcohol use, cocaine use and IV drug use  Endo/Other  negative endocrine ROS    Renal/GU negative Renal ROS  negative genitourinary   Musculoskeletal Lower leg nec fasc   Abdominal Normal abdominal exam  (+)   Peds  Hematology Lab Results      Component                Value               Date                      WBC                      43.7 (H)            01/08/2024                HGB                      10.1 (L)            01/08/2024                HCT                      28.2 (L)            01/08/2024                MCV                      71.0 (L)            01/08/2024                PLT                      244                 01/08/2024              Anesthesia Other Findings   Reproductive/Obstetrics                              Anesthesia Physical Anesthesia Plan  ASA: 2  Anesthesia Plan: General   Post-op Pain Management:    Induction: Intravenous  PONV Risk Score and Plan: 1 and Ondansetron , Dexamethasone , Midazolam  and Treatment may vary due to age or medical condition  Airway Management Planned: Mask and LMA  Additional Equipment: None  Intra-op Plan:   Post-operative Plan: Extubation in OR  Informed Consent: I have reviewed the patients History and Physical, chart, labs and discussed the procedure including the risks, benefits and  alternatives for the proposed anesthesia with the patient or authorized representative who  has indicated his/her understanding and acceptance.     Dental advisory given  Plan Discussed with: CRNA  Anesthesia Plan Comments:         Anesthesia Quick Evaluation

## 2024-01-09 ENCOUNTER — Encounter (HOSPITAL_COMMUNITY): Payer: Self-pay | Admitting: Orthopedic Surgery

## 2024-01-09 ENCOUNTER — Other Ambulatory Visit: Payer: Self-pay

## 2024-01-09 DIAGNOSIS — R7989 Other specified abnormal findings of blood chemistry: Secondary | ICD-10-CM

## 2024-01-09 DIAGNOSIS — K760 Fatty (change of) liver, not elsewhere classified: Secondary | ICD-10-CM

## 2024-01-09 DIAGNOSIS — L03115 Cellulitis of right lower limb: Secondary | ICD-10-CM | POA: Diagnosis not present

## 2024-01-09 DIAGNOSIS — D72829 Elevated white blood cell count, unspecified: Secondary | ICD-10-CM | POA: Diagnosis not present

## 2024-01-09 LAB — CBC WITH DIFFERENTIAL/PLATELET
Abs Granulocyte: 30.7 K/uL — ABNORMAL HIGH (ref 1.5–6.5)
Abs Immature Granulocytes: 1.86 K/uL — ABNORMAL HIGH (ref 0.00–0.07)
Basophils Absolute: 0.2 K/uL — ABNORMAL HIGH (ref 0.0–0.1)
Basophils Relative: 0 %
Eosinophils Absolute: 0 K/uL (ref 0.0–0.5)
Eosinophils Relative: 0 %
HCT: 28.3 % — ABNORMAL LOW (ref 39.0–52.0)
Hemoglobin: 9.8 g/dL — ABNORMAL LOW (ref 13.0–17.0)
Immature Granulocytes: 5 %
Lymphocytes Relative: 5 %
Lymphs Abs: 1.7 K/uL (ref 0.7–4.0)
MCH: 25 pg — ABNORMAL LOW (ref 26.0–34.0)
MCHC: 34.6 g/dL (ref 30.0–36.0)
MCV: 72.2 fL — ABNORMAL LOW (ref 80.0–100.0)
Monocytes Absolute: 2 K/uL — ABNORMAL HIGH (ref 0.1–1.0)
Monocytes Relative: 5 %
Neutro Abs: 30.7 K/uL — ABNORMAL HIGH (ref 1.7–7.7)
Neutrophils Relative %: 85 %
Platelets: 238 K/uL (ref 150–400)
RBC: 3.92 MIL/uL — ABNORMAL LOW (ref 4.22–5.81)
RDW: 15.9 % — ABNORMAL HIGH (ref 11.5–15.5)
Smear Review: NORMAL
WBC: 36.4 K/uL — ABNORMAL HIGH (ref 4.0–10.5)
nRBC: 0 % (ref 0.0–0.2)

## 2024-01-09 LAB — BASIC METABOLIC PANEL WITH GFR
Anion gap: 8 (ref 5–15)
BUN: 6 mg/dL (ref 6–20)
CO2: 24 mmol/L (ref 22–32)
Calcium: 7.7 mg/dL — ABNORMAL LOW (ref 8.9–10.3)
Chloride: 100 mmol/L (ref 98–111)
Creatinine, Ser: 0.73 mg/dL (ref 0.61–1.24)
GFR, Estimated: >60 mL/min (ref >60)
Glucose, Bld: 108 mg/dL — ABNORMAL HIGH (ref 70–99)
Potassium: 3.9 mmol/L (ref 3.5–5.1)
Sodium: 132 mmol/L — ABNORMAL LOW (ref 135–145)

## 2024-01-09 LAB — HEPATIC FUNCTION PANEL
ALT: 97 U/L — ABNORMAL HIGH (ref 0–44)
AST: 141 U/L — ABNORMAL HIGH (ref 15–41)
Albumin: 1.6 g/dL — ABNORMAL LOW (ref 3.5–5.0)
Alkaline Phosphatase: 147 U/L — ABNORMAL HIGH (ref 38–126)
Bilirubin, Direct: 2.8 mg/dL — ABNORMAL HIGH (ref 0.0–0.2)
Indirect Bilirubin: 1.6 mg/dL — ABNORMAL HIGH (ref 0.3–0.9)
Total Bilirubin: 4.4 mg/dL — ABNORMAL HIGH (ref 0.0–1.2)
Total Protein: 5.2 g/dL — ABNORMAL LOW (ref 6.5–8.1)

## 2024-01-09 LAB — SURGICAL PATHOLOGY

## 2024-01-09 LAB — IRON AND TIBC
Iron: 12 ug/dL — ABNORMAL LOW (ref 45–182)
Saturation Ratios: 9 % — ABNORMAL LOW (ref 17.9–39.5)
TIBC: 130 ug/dL — ABNORMAL LOW (ref 250–450)
UIBC: 118 ug/dL

## 2024-01-09 LAB — C-REACTIVE PROTEIN: CRP: 16.7 mg/dL — ABNORMAL HIGH (ref <1.0)

## 2024-01-09 LAB — FERRITIN: Ferritin: 922 ng/mL — ABNORMAL HIGH (ref 24–336)

## 2024-01-09 MED ORDER — POLYETHYLENE GLYCOL 3350 17 G PO PACK
17.0000 g | PACK | Freq: Every day | ORAL | Status: DC
  Administered 2024-01-09 – 2024-01-17 (×5): 17 g via ORAL
  Filled 2024-01-09 (×12): qty 1

## 2024-01-09 MED ORDER — SENNOSIDES-DOCUSATE SODIUM 8.6-50 MG PO TABS: 1.0000 | ORAL_TABLET | Freq: Every day | ORAL | Status: DC

## 2024-01-09 MED ORDER — METOCLOPRAMIDE HCL 5 MG PO TABS: 5.0000 mg | ORAL_TABLET | Freq: Three times a day (TID) | ORAL | Status: DC | PRN

## 2024-01-09 MED ORDER — ENOXAPARIN SODIUM 40 MG/0.4ML IJ SOSY
40.0000 mg | PREFILLED_SYRINGE | INTRAMUSCULAR | Status: DC
  Administered 2024-01-09 – 2024-01-13 (×5): 40 mg via SUBCUTANEOUS
  Filled 2024-01-09 (×6): qty 0.4

## 2024-01-09 MED ORDER — DOCUSATE SODIUM 100 MG PO CAPS: 100.0000 mg | ORAL_CAPSULE | Freq: Two times a day (BID) | ORAL | Status: DC

## 2024-01-09 MED ORDER — ACETAMINOPHEN 10 MG/ML IV SOLN: 1000.0000 mg | Freq: Once | INTRAVENOUS | Status: DC | PRN

## 2024-01-09 MED ORDER — SENNA 8.6 MG PO TABS
1.0000 | ORAL_TABLET | Freq: Two times a day (BID) | ORAL | Status: DC
  Administered 2024-01-09 – 2024-01-26 (×34): 8.6 mg via ORAL
  Filled 2024-01-09 (×31): qty 1

## 2024-01-09 MED ORDER — SUGAMMADEX SODIUM 200 MG/2ML IV SOLN
INTRAVENOUS | Status: DC | PRN
  Administered 2024-01-09: 200 mg via INTRAVENOUS

## 2024-01-09 MED ORDER — ONDANSETRON HCL 4 MG/2ML IJ SOLN
4.0000 mg | Freq: Four times a day (QID) | INTRAMUSCULAR | Status: DC | PRN
  Administered 2024-01-14 – 2024-01-21 (×3): 4 mg via INTRAVENOUS

## 2024-01-09 MED ORDER — ONDANSETRON HCL 4 MG PO TABS: 4.0000 mg | ORAL_TABLET | Freq: Four times a day (QID) | ORAL | Status: DC | PRN

## 2024-01-09 MED ORDER — SODIUM CHLORIDE 0.9 % IV SOLN: INTRAVENOUS | Status: AC

## 2024-01-09 MED ORDER — FENTANYL CITRATE (PF) 100 MCG/2ML IJ SOLN: 25.0000 ug | INTRAMUSCULAR | Status: DC | PRN

## 2024-01-09 MED ORDER — METOCLOPRAMIDE HCL 5 MG/ML IJ SOLN: 5.0000 mg | Freq: Three times a day (TID) | INTRAMUSCULAR | Status: DC | PRN

## 2024-01-09 NOTE — Transfer of Care (Signed)
 Immediate Anesthesia Transfer of Care Note  Patient: Robert Eaton  Procedure(s) Performed: IRRIGATION AND DEBRIDEMENT WOUND (Right) APPLICATION, WOUND VAC (Right: Leg Lower)  Patient Location: PACU  Anesthesia Type:General  Level of Consciousness: awake, alert , and oriented  Airway & Oxygen Therapy: Patient Spontanous Breathing and Patient connected to nasal cannula oxygen  Post-op Assessment: Report given to RN, Post -op Vital signs reviewed and stable, and Patient moving all extremities  Post vital signs: Reviewed and stable  Last Vitals:  Vitals Value Taken Time  BP 124/77 01/09/24 00:30  Temp 36.7 C 01/09/24 00:24  Pulse 105 01/09/24 00:25  Resp 11 01/09/24 00:30  SpO2 100 % 01/09/24 00:25  Vitals shown include unfiled device data.  Last Pain:  Vitals:   01/08/24 1748  TempSrc: Oral  PainSc: 5          Complications: No notable events documented.

## 2024-01-09 NOTE — Discharge Instructions (Addendum)
 Weight bearing as tolerates.  Keep vac dressing clean and dry.  Elevate your leg laying down when possible.

## 2024-01-09 NOTE — Progress Notes (Signed)
 Patient ID: Robert Eaton, male   DOB: 24-Mar-1986, 38 y.o.   MRN: [id number]    Progress Note   Subjective   Day # 2 CC; hyperbilirubinemia, elevated LFTs in setting of acute right lower extremity cellulitis, history of EtOH use disorder, and polysubstance abuse on chronic Suboxone   Status post OR last night-I&D of right lower extremity abscess and excisional debridement of subcutaneous tissue and fascia right lower extremity/wound VAC in place  Labs today-WBC 36.4 down from 54.3 Hemoglobin 9.8/hematocrit 28.3/platelets 238 T. bili 4.4/alk phos 147/AST 141/ALT 97 HIV negative Hep B and C serologies negative  CT abdomen and pelvis 01/07/2024-fatty liver, no ductal dilation, cholelithiasis.  Patient denies any abdominal discomfort today, complaining of right lower extremity pain, officer at bedside   Objective   Vital signs in last 24 hours: Temp:  [98 F (36.7 C)-100.6 F (38.1 C)] 98.5 F (36.9 C) (08/01 0729) Pulse Rate:  [91-105] 91 (08/01 0729) Resp:  [10-18] 11 (08/01 0729) BP: (98-124)/(58-77) 102/68 (08/01 0729) SpO2:  [93 %-100 %] 100 % (08/01 0729)   General:    Young male n NAD Heart:  Regular rate and rhythm; no murmurs Lungs: Respirations even and unlabored, lungs CTA bilaterally Abdomen:  Soft, nontender and nondistended. Normal bowel sounds. Extremities:  Without edema. Neurologic:  Alert and oriented,  grossly normal neurologically. Psych:  Cooperative. Normal mood and affect.  Intake/Output from previous day: 07/31 0701 - 08/01 0700 In: 2596 [P.O.:720; I.V.:1600; IV Piggyback:250] Out: 920 [Drains:620; Blood:300] Intake/Output this shift: No intake/output data recorded.  Lab Results: Recent Labs    01/07/24 1320 01/08/24 0622 01/09/24 0329  WBC 54.3* 43.7* 36.4*  HGB 12.3* 10.1* 9.8*  HCT 34.9* 28.2* 28.3*  PLT 262 244 238   BMET Recent Labs    01/07/24 1320 01/07/24 1753 01/08/24 0622 01/09/24 0329  NA 126* 127* 130* 132*  K 3.4*  --   3.9 3.9  CL 89*  --  97* 100  CO2 25  --  28 24  GLUCOSE 122*  --  109* 108*  BUN 11  --  8 6  CREATININE 0.94  --  0.78 0.73  CALCIUM 8.1*  --  7.7* 7.7*   LFT Recent Labs    01/09/24 0329  PROT 5.2*  ALBUMIN  1.6*  AST 141*  ALT 97*  ALKPHOS 147*  BILITOT 4.4*  BILIDIR 2.8*  IBILI 1.6*   PT/INR Recent Labs    01/07/24 1320  LABPROT 18.0*  INR 1.4*    Studies/Results: CT ABDOMEN PELVIS W CONTRAST Addendum Date: 01/08/2024 ADDENDUM REPORT: 01/08/2024 11:29 ADDENDUM: I was informed that the patient has significant cellulitis in the right lower extremity. In light of new history, the right inguinal adenopathy is likely reactive and related to the infectious process. Clinical correlation and follow-up recommended. Electronically Signed   By: Vanetta Chou M.D.   On: 01/08/2024 11:29   Result Date: 01/08/2024 CLINICAL DATA:  Abdominal pain.  Jaundice. EXAM: CT ABDOMEN AND PELVIS WITH CONTRAST TECHNIQUE: Multidetector CT imaging of the abdomen and pelvis was performed using the standard protocol following bolus administration of intravenous contrast. RADIATION DOSE REDUCTION: This exam was performed according to the departmental dose-optimization program which includes automated exposure control, adjustment of the mA and/or kV according to patient size and/or use of iterative reconstruction technique. CONTRAST:  75mL OMNIPAQUE  IOHEXOL  350 MG/ML SOLN COMPARISON:  None Available. FINDINGS: Lower chest: The visualized lung bases are clear. No intra-abdominal free air or free fluid. Hepatobiliary: Fatty liver.  No biliary dilatation. Gallstones. No pericholecystic fluid or evidence of acute cholecystitis by CT. Pancreas: Unremarkable. No pancreatic ductal dilatation or surrounding inflammatory changes. Spleen: Normal in size without focal abnormality. Adrenals/Urinary Tract: The adrenal glands unremarkable. Nonobstructing right renal inferior pole calculi measure up to 2 mm. No  hydronephrosis. The left kidney is unremarkable. The visualized ureters and urinary bladder appear unremarkable. Stomach/Bowel: There is no bowel obstruction or active inflammation. The appendix is normal. Vascular/Lymphatic: Mild aortoiliac atherosclerotic disease. The IVC is unremarkable. Right iliac chain and inguinal adenopathy measure up to 2.1 cm in short axis concerning for malignancy. Several enlarged lymph nodes in the proximal right thigh. There is mild edema in the superficial soft tissues of the right thigh. Reproductive: The prostate and seminal vesicles are grossly unremarkable. Other: None Musculoskeletal: Partially visualized right femoral nail. No acute osseous pathology. IMPRESSION: 1. Right iliac chain and inguinal adenopathy concerning for malignancy. 2. Fatty liver. 3. Cholelithiasis. 4. Nonobstructing right renal inferior pole calculi. No hydronephrosis. 5.  Aortic Atherosclerosis (ICD10-I70.0). Electronically Signed: By: Vanetta Chou M.D. On: 01/07/2024 15:36   CT TIBIA FIBULA RIGHT W CONTRAST Result Date: 01/08/2024 CLINICAL DATA:  Right lower extremity edema and cellulitis. EXAM: CT OF THE LOWER RIGHT EXTREMITY WITH CONTRAST TECHNIQUE: Multidetector CT imaging of the lower right extremity was performed according to the standard protocol following intravenous contrast administration. RADIATION DOSE REDUCTION: This exam was performed according to the departmental dose-optimization program which includes automated exposure control, adjustment of the mA and/or kV according to patient size and/or use of iterative reconstruction technique. CONTRAST:  75mL OMNIPAQUE  IOHEXOL  350 MG/ML SOLN COMPARISON:  Right tibia and fibula radiographs dated 01/07/2024. FINDINGS: Bones/Joint/Cartilage No acute fracture or dislocation. Partially visualized intramedullary nail and distal interlocking screws within the distal femur. Visualized hardware is intact without evidence of significant periprosthetic  lucency. Mild medial femorotibial compartment joint space narrowing. No significant joint effusion. Ankle mortise is congruent. Ligaments Ligaments are suboptimally evaluated by CT. Soft tissue/Muscles and Tendons Diffuse circumferential subcutaneous edema and stranding with overlying cutaneous thickening extending from the knee through the ankle and into the dorsal foot. No soft tissue gas. There is a hypodense fluid collection extending along the undersurface of the investing layer of the deep fascia of the posterior compartment musculature, most pronounced overlying the medial head of the gastrocnemius muscle. This curvilinear fluid collection measures up to 1.6 cm TR by 7.5 cm AP (series 4, images 120 and 160) and 22 cm in craniocaudal extent (series 9, image 69). Fluid extends into the deep intramuscular fascial planes of the posterior compartment. IMPRESSION: 1. Cellulitis extending from the knee through the ankle and into the dorsal foot. No soft tissue gas. There is a hypodense fluid collection extending along the undersurface of the investing layer of the deep fascia of the posterior compartment musculature from the proximal to distal calf, most pronounced overlying the medial head of the gastrocnemius muscle. Fluid extends into the deep intramuscular fascial planes of the posterior compartment. These findings are concerning for fasciitis. 2. No acute osseous abnormality. Electronically Signed   By: Harrietta Sherry M.D.   On: 01/08/2024 11:24   CT FOOT RIGHT W CONTRAST Result Date: 01/08/2024 CLINICAL DATA:  Right lower extremity edema and cellulitis. EXAM: CT OF THE LOWER RIGHT EXTREMITY WITH CONTRAST TECHNIQUE: Multidetector CT imaging of the lower right extremity was performed according to the standard protocol following intravenous contrast administration. RADIATION DOSE REDUCTION: This exam was performed according to the departmental dose-optimization program which  includes automated exposure  control, adjustment of the mA and/or kV according to patient size and/or use of iterative reconstruction technique. CONTRAST:  75mL OMNIPAQUE  IOHEXOL  350 MG/ML SOLN COMPARISON:  Right tibia and fibula radiographs dated 01/07/2024. FINDINGS: Bones/Joint/Cartilage No acute fracture or dislocation. Partially visualized intramedullary nail and distal interlocking screws within the distal femur. Visualized hardware is intact without evidence of significant periprosthetic lucency. Mild medial femorotibial compartment joint space narrowing. No significant joint effusion. Ankle mortise is congruent. Ligaments Ligaments are suboptimally evaluated by CT. Soft tissue/Muscles and Tendons Diffuse circumferential subcutaneous edema and stranding with overlying cutaneous thickening extending from the knee through the ankle and into the dorsal foot. No soft tissue gas. There is a hypodense fluid collection extending along the undersurface of the investing layer of the deep fascia of the posterior compartment musculature, most pronounced overlying the medial head of the gastrocnemius muscle. This curvilinear fluid collection measures up to 1.6 cm TR by 7.5 cm AP (series 4, images 120 and 160) and 22 cm in craniocaudal extent (series 9, image 69). Fluid extends into the deep intramuscular fascial planes of the posterior compartment. IMPRESSION: 1. Cellulitis extending from the knee through the ankle and into the dorsal foot. No soft tissue gas. There is a hypodense fluid collection extending along the undersurface of the investing layer of the deep fascia of the posterior compartment musculature from the proximal to distal calf, most pronounced overlying the medial head of the gastrocnemius muscle. Fluid extends into the deep intramuscular fascial planes of the posterior compartment. These findings are concerning for fasciitis. 2. No acute osseous abnormality. Electronically Signed   By: Harrietta Sherry M.D.   On: 01/08/2024 11:24    DG Tibia/Fibula Right Result Date: 01/07/2024 CLINICAL DATA:  Leg swelling and pain. EXAM: RIGHT TIBIA AND FIBULA - 2 VIEW COMPARISON:  None Available. FINDINGS: There is no evidence of acute fracture. Partially visualized fixation hardware in the distal femur. Diffuse generalized subcutaneous edema and cutaneous thickening of the right lower extremity. No soft tissue gas. IMPRESSION: 1. Diffuse generalized subcutaneous edema and cutaneous thickening of the right lower extremity, which could reflect cellulitis in the appropriate clinical setting. No soft tissue gas. 2. No acute osseous abnormality. Electronically Signed   By: Harrietta Sherry M.D.   On: 01/07/2024 13:38   DG Chest Port 1 View Result Date: 01/07/2024 CLINICAL DATA:  Concern for sepsis. EXAM: PORTABLE CHEST 1 VIEW COMPARISON:  01/11/2022. FINDINGS: The heart size and mediastinal contours are within normal limits. No focal consolidation, sizeable pleural effusion, or pneumothorax. No acute osseous abnormality. IMPRESSION: No acute cardiopulmonary findings. Electronically Signed   By: Harrietta Sherry M.D.   On: 01/07/2024 13:37       Assessment / Plan:    #47 38 year old male with polysubstance abuse, on Suboxone , EtOH use disorder, currently incarcerated, admitted with right lower extremity cellulitis  Status post OR last p.m. for I&D and debridement of subcutaneous tissue and fascia  Still with significant leukocytosis though trending down  #2 elevated LFTs Suspect this may be combination of reactive changes due to acute infection and underlying mild EtOH hepatitis  No evidence of cirrhosis by labs or imaging  Plan; no further GI workup indicated at present would continue to trend LFTs. GI will be available if needed     Principal Problem:   Cellulitis of right lower extremity Active Problems:   Polysubstance abuse (HCC)   Alcohol use disorder   Opioid use disorder   Sepsis (HCC)  Jaundice    Hyperbilirubinemia   Hyponatremia   Fasciitis     LOS: 2 days   Jimesha Rising PA-C 01/09/2024, 10:36 AM

## 2024-01-09 NOTE — Anesthesia Postprocedure Evaluation (Signed)
 Anesthesia Post Note  Patient: Robert Eaton  Procedure(s) Performed: IRRIGATION AND DEBRIDEMENT WOUND (Right) APPLICATION, WOUND VAC (Right: Leg Lower)     Patient location during evaluation: PACU Anesthesia Type: General Level of consciousness: awake and alert Pain management: pain level controlled Vital Signs Assessment: post-procedure vital signs reviewed and stable Respiratory status: spontaneous breathing, nonlabored ventilation, respiratory function stable and patient connected to nasal cannula oxygen Cardiovascular status: blood pressure returned to baseline and stable Postop Assessment: no apparent nausea or vomiting Anesthetic complications: no   No notable events documented.  Last Vitals:  Vitals:   01/09/24 0131 01/09/24 0729  BP: 110/62 102/68  Pulse: 100 91  Resp: 14 11  Temp: 36.7 C 36.9 C  SpO2: 98% 100%    Last Pain:  Vitals:   01/09/24 0729  TempSrc: Oral  PainSc:                  [name] Danay Mckellar

## 2024-01-09 NOTE — Op Note (Signed)
 OPERATIVE REPORT   01/08/2024  12:10 AM  PATIENT:  Robert Eaton   SURGEON:  Redell JINNY Shoals, MD  ASSISTANT:  Valery Potters, PA-C.   PREOPERATIVE DIAGNOSIS: Right lower extremity intramuscular abscess with fasciitis.  POSTOPERATIVE DIAGNOSIS:  Same.  PROCEDURE:  1.  Incision and drainage right lower extremity abscess. 2.  Excisional debridement subcutaneous tissue and fascia right lower extremity. 3.  Application of wound VAC.  ANESTHESIA:   GETA.  ANTIBIOTICS: 2 g Ancef .  IMPLANTS: None.  SPECIMENS:  1.  Right lower extremity abscess culture swab for aerobic and anaerobic culture. 2.  Right lower extremity fascia for aerobic and anaerobic tissue culture.  COMPLICATIONS: None.  DISPOSITION: Stable to PACU.  SURGICAL INDICATIONS:  Robert Eaton is a 38 y.o. male inmate with approximately 2 weeks of increasing right lower extremity pain and swelling.  He came to the emergency department yesterday due to increased pain, swelling, and erythema.  CT scan revealed intramuscular abscess on the posterior aspect of the medial gastrocnemius with fasciitis.  There was no subcutaneous gas.  He was indicated for surgical debridement.  The risks, benefits, and alternatives were discussed with the patient preoperatively including but not limited to the risks of infection, bleeding, nerve / blood vessel injury, cardiopulmonary complications, the need for repeat surgery, among others, and the patient was willing to proceed.  PROCEDURE IN DETAIL: The patient was identified in the holding area using 2 identifiers.  The surgical site was marked by myself.  He was taken to the operating room, and general anesthesia was induced.  He was flipped to the prone position.  All bony prominences were well-padded.  The right lower extremity was prepped and draped in the normal sterile surgical fashion.  He did receive IV antibiotics and 6 months of get the procedure.  I began by examining the right lower  extremity.  Had significant swelling and blistering over the medial calf.  I made a longitudinal incision with a #10 blade approximately 2 fingerbreadths posterior to the medial border of the tibia.  Blunt digital dissection was performed, and I encountered a large collection of dishwasher type fluid.  Culture swabs  were sent.  The fascia overlying the medial gastroc was abnormal and was excisionally debrided with a rongeur.  I dissected digitally subcutaneously over the medial gastroc.  Digital dissection was performed between the gastrocnemius and the soleus.  Once I was satisfied that there was no further extension, remaining fascia was excisionally debrided with a rongeur.  All the muscle appeared viable and contracted well with Bovie stimulation.  Once I was satisfied with the debridement, the wound was irrigated with 9 L of normal saline using GU tubing.  A large vera flow VAC sponge was placed into the wound and tunneled superficially along the medial calf.  Suction was hooked up at 125 mmHg.  There was excellent seal without any leak.  Sterile dressing was applied with cast padding and an Ace wrap.  The patient was turned supine, extubated, and taken to the PACU in stable condition.  Sponge, needle, instrument counts were correct at the end of the case x 2.  There were no known complications.  POSTOPERATIVE PLAN: Postoperatively, the patient will be readmitted to the hospitalist service.  Recommend elevation and IV antibiotics.  Follow IntraOp cultures.  I have discussed the patient with Dr. Duda, who plans to take the patient back to the OR early next week.

## 2024-01-09 NOTE — Progress Notes (Signed)
    Subjective:  Patient reports pain as moderate.  Denies N/V/CP/SOB/Abd pain this morning.  Denies tingling or numbness.  Reports pain with movement. Discussed care this morning.    Objective:   VITALS:   Vitals:   01/09/24 0045 01/09/24 0054 01/09/24 0131 01/09/24 0729  BP: 107/61 (!) 107/58 110/62 102/68  Pulse: 95 95 100 91  Resp: 10 11 14 11   Temp:  98.1 F (36.7 C) 98 F (36.7 C) 98.5 F (36.9 C)  TempSrc:   Oral Oral  SpO2: 93% 94% 98% 100%  Weight:      Height:        NAD, handcuffed to bed.  Neurologically intact ABD soft Neurovascular intact Sensation intact distally Intact pulses distally Dorsiflexion/Plantar flexion intact No cellulitis present Compartment soft Wound vac with veraflo , no leaks detected. 150 cc SS fluid in canister this morning.   Lab Results  Component Value Date   WBC 36.4 (H) 01/09/2024   HGB 9.8 (L) 01/09/2024   HCT 28.3 (L) 01/09/2024   MCV 72.2 (L) 01/09/2024   PLT 238 01/09/2024   BMET    Component Value Date/Time   NA 132 (L) 01/09/2024 0329   K 3.9 01/09/2024 0329   CL 100 01/09/2024 0329   CO2 24 01/09/2024 0329   GLUCOSE 108 (H) 01/09/2024 0329   BUN 6 01/09/2024 0329   CREATININE 0.73 01/09/2024 0329   CALCIUM 7.7 (L) 01/09/2024 0329   GFRNONAA >60 01/09/2024 0329    Assessment/Plan: 1 Day Post-Op   Principal Problem:   Cellulitis of right lower extremity Active Problems:   Polysubstance abuse (HCC)   Alcohol use disorder   Opioid use disorder   Sepsis (HCC)   Jaundice   Hyperbilirubinemia   Hyponatremia   Fasciitis   TDWB RLE with walker DVT ppx: none due to bleeding risk, SCDs, TEDS PO pain control PT/OT: Has not seen Dispo: - patient under care of the medical team disposition per their recommendation.  - Wound vac in place with negative pressure, maintain seal and veraflo therapy.  - Recommend elevation and IV antibiotics.  - Follow IntraOp cultures.  - Patient has been  discussed with Dr. Harden, who plans to take the patient back to the OR early next week.    Robert Eaton 01/09/2024, 8:51 AM   [facility]  [place] [address], [address], [place], [place] [address] Phone: [phone] [email or web address] Facebook  [employer]

## 2024-01-09 NOTE — Progress Notes (Addendum)
 HD#2 SUBJECTIVE:  Patient Summary: Robert Eaton is a 38 y.o. with a pertinent PMH of COPD, polysubstance abuse, abuse disorder on Suboxone , alcohol use disorder without known withdrawal, and major depressive disorder, who presented with right lower extremity edema and jaundice and admitted for sepsis 2/2 right lower extermity cellulitis with hyperbilirubinemia and severe leukocytosis.   Overnight Events: none  Interim History: Patient evaluated at bedside. He doesn't note significant worsening pain this morning, but isn't sure yet because he hasn't moved around this morning. States he was given pain medication last night for surgery but has not had anything this morning. Denies SOB, difficulty breathing, fever, chills, or headaches today. He denies a known family history of anemia, bleeding problems or clotting problems.   OBJECTIVE:  Vital Signs: Vitals:   01/09/24 0045 01/09/24 0054 01/09/24 0131 01/09/24 0729  BP: 107/61 (!) 107/58 110/62 102/68  Pulse: 95 95 100 91  Resp: 10 11 14 11   Temp:  98.1 F (36.7 C) 98 F (36.7 C) 98.5 F (36.9 C)  TempSrc:   Oral Oral  SpO2: 93% 94% 98% 100%  Weight:      Height:       Supplemental O2: Room Air SpO2: 100 % O2 Flow Rate (L/min): 2 L/min  Filed Weights   01/07/24 1300  Weight: 79.4 kg     Intake/Output Summary (Last 24 hours) at 01/09/2024 1109 Last data filed at 01/09/2024 9340 Gross per 24 hour  Intake 2356 ml  Output 920 ml  Net 1436 ml   Net IO Since Admission: 3,226.6 mL [01/09/24 1109]  Physical Exam: Physical Exam Constitutional:      General: He is not in acute distress.    Appearance: He is ill-appearing. He is not toxic-appearing.     Comments: jaundiced  HENT:     Head: Normocephalic and atraumatic.  Eyes:     Extraocular Movements: Extraocular movements intact.     Pupils: Pupils are equal, round, and reactive to light.  Cardiovascular:     Rate and Rhythm: Normal rate and regular rhythm.     Pulses:  Normal pulses.  Pulmonary:     Effort: Pulmonary effort is normal.     Breath sounds: Normal breath sounds.  Abdominal:     General: Abdomen is flat. Bowel sounds are normal.     Palpations: Abdomen is soft.     Comments: Positive hepatomegaly  Skin:    General: Skin is dry.     Coloration: Skin is jaundiced.     Comments: RLE wrapped post-surgery. Wound vac in place and draining appropriately. Still has warmth and edema to his right knee.  Neurological:     General: No focal deficit present.     Mental Status: He is alert and oriented to person, place, and time.  Psychiatric:        Mood and Affect: Mood normal.     Patient Lines/Drains/Airways Status     Active Line/Drains/Airways     Name Placement date Placement time Site Days   Peripheral IV 01/07/24 20 G Posterior;Right Hand 01/07/24  1254  Hand  1   Peripheral IV 01/07/24 18 G Anterior;Distal;Left;Upper Arm 01/07/24  1317  Arm  1            Pertinent labs and imaging:      Latest Ref Rng & Units 01/09/2024    3:29 AM 01/08/2024    6:22 AM 01/07/2024    1:20 PM  CBC  WBC 4.0 - 10.5  K/uL 36.4  43.7  54.3   Hemoglobin 13.0 - 17.0 g/dL 9.8  89.8  87.6   Hematocrit 39.0 - 52.0 % 28.3  28.2  34.9   Platelets 150 - 400 K/uL 238  244  262        Latest Ref Rng & Units 01/09/2024    3:29 AM 01/08/2024    6:22 AM 01/07/2024    5:53 PM  CMP  Glucose 70 - 99 mg/dL 891  890    BUN 6 - 20 mg/dL 6  8    Creatinine 9.38 - 1.24 mg/dL 9.26  9.21    Sodium 864 - 145 mmol/L 132  130  127   Potassium 3.5 - 5.1 mmol/L 3.9  3.9    Chloride 98 - 111 mmol/L 100  97    CO2 22 - 32 mmol/L 24  28    Calcium 8.9 - 10.3 mg/dL 7.7  7.7    Total Protein 6.5 - 8.1 g/dL 5.2  4.9  4.5   Total Bilirubin 0.0 - 1.2 mg/dL 4.4  4.3  5.6   Alkaline Phos 38 - 126 U/L 147  122  123   AST 15 - 41 U/L 141  108  156   ALT 0 - 44 U/L 97  92  121     No results found.   ASSESSMENT/PLAN:  Assessment: Principal Problem:   Cellulitis of right  lower extremity Active Problems:   Polysubstance abuse (HCC)   Alcohol use disorder   Opioid use disorder   Sepsis (HCC)   Jaundice   Hyperbilirubinemia   Hyponatremia   Fasciitis  Robert Eaton is a 38 y.o. with a pertinent PMH of COPD, polysubstance abuse, abuse disorder on Suboxone , alcohol use disorder without known withdrawal, and major depressive disorder, who presented with right lower extremity edema and jaundice and admitted for sepsis 2/2 right lower extermity cellulitis with hyperbilirubinemia and severe leukocytosis.   Plan: #Sepsis d/t right lower extremity SSTI with abscess and fasciitis CT foot and tibia-fibula c/f fasciitis. Orthopedic surgery consulted and completed I&D 8/1. Postoperatively recommends continued IV antibiotics and elevation and to follow intraoperative cultures. Dr. Duda will take patient back to OR early next week. On IV Linezolid  and Zosyn . Severe leukocytosis of 54 initially, improving to 36.4 today. Fever has resolved and tachycardia improved. Preliminary blood cultures showed no growth. MR right femur obtained to rule out previous hardware involvement, still pending.  -Continue IV Linezolid  and Zosyn  -Closely monitor fevers, daily CBC -Orthopedic surgery to take patient back to OR early next week -Pending intraoperative wound cultures -Pending MRI right femur for hardware evaluation  #Hyperbilirubinemia Transaminitis Initially, bilirubin elevated at 5.6 with clinical jaundice. AST and ALT were moderately elevated at 153 and 140 respectively. Alkaline phosphatase at 141. Lipase normal. Hepatitis panel normal. CT scan without signs of biliary obstruction but there is cholelithiasis. No history of known liver failure or cirrhosis. No evidence of ascending cholangitis. Platelets currently normal without history of thrombocytopenia. GI was consulted given concern for possible concomitant alcohol related hepatitis. Suspected 2/2 sepsis, possible mild EtOH  hepatitis.  -Follow LFTs with daily CMP   Hyponatremia Improving. Na 132 today. Sodium on admission 126. Likely in the setting of sepsis and volume depletion. -Trend CMP  Iliac and inguinal adenopathy Seen on admission CT and noted to be concerning for malignancy. Further clinical information regarding RLE extensive infection provided to radiology and with these findings they suggest reactive LAD. Recommend follow-up CT abdomen and  pelvis to ensure no malignant cause after acute infection resolves during this admission.   Microcytic anemia Mild anemia on presentation with significant microcytosis. MCV of 71.5 and slightly elevated RDW. No abnormal morphology on initial peripheral smear, but subsequent smears showed target cells and Dohle bodies. Will work up iron panel to look for any anemia abnormalities.   Opiate Use Disorder Continue prior suboxone  dose 8-2mg  TID.  Given extensive pain from right calf cellulitis, will give patient oxycodone  5 mg every 4 hours as needed. May need higher doses of pain medication given concomitant Suboxone  use. Will monitor his pain level and coverage with PRN oxycodone .   COPD Continue albuterol  PRN. No signs of respiratory distress or COPD exacerbation.    Best Practice: Diet: Regular diet IVF: None VTE: enoxaparin  (LOVENOX ) injection 40 mg Start: 01/09/24 1000 SCDs Start: 01/09/24 0716 Code: Full  Disposition planning: Therapy Recs: None, DME: none Family Contact: dad DISPO: Anticipated discharge in 2-3 days to [facility] pending clinical improvement.  Signature:  Boluwatife Mutchler [facility]  11:09 AM, 01/09/2024  On Call pager [phone]

## 2024-01-10 DIAGNOSIS — M729 Fibroblastic disorder, unspecified: Secondary | ICD-10-CM

## 2024-01-10 DIAGNOSIS — E43 Unspecified severe protein-calorie malnutrition: Secondary | ICD-10-CM

## 2024-01-10 LAB — CBC WITH DIFFERENTIAL/PLATELET
Abs Immature Granulocytes: 1.24 K/uL — ABNORMAL HIGH (ref 0.00–0.07)
Basophils Absolute: 0.1 K/uL (ref 0.0–0.1)
Basophils Relative: 0 %
Eosinophils Absolute: 0.1 K/uL (ref 0.0–0.5)
Eosinophils Relative: 1 %
HCT: 24.8 % — ABNORMAL LOW (ref 39.0–52.0)
Hemoglobin: 8.7 g/dL — ABNORMAL LOW (ref 13.0–17.0)
Immature Granulocytes: 5 %
Lymphocytes Relative: 11 %
Lymphs Abs: 2.8 K/uL (ref 0.7–4.0)
MCH: 25 pg — ABNORMAL LOW (ref 26.0–34.0)
MCHC: 35.1 g/dL (ref 30.0–36.0)
MCV: 71.3 fL — ABNORMAL LOW (ref 80.0–100.0)
Monocytes Absolute: 1.8 K/uL — ABNORMAL HIGH (ref 0.1–1.0)
Monocytes Relative: 7 %
Neutro Abs: 18.7 K/uL — ABNORMAL HIGH (ref 1.7–7.7)
Neutrophils Relative %: 76 %
Platelets: 263 K/uL (ref 150–400)
RBC: 3.48 MIL/uL — ABNORMAL LOW (ref 4.22–5.81)
RDW: 15.9 % — ABNORMAL HIGH (ref 11.5–15.5)
WBC: 24.7 K/uL — ABNORMAL HIGH (ref 4.0–10.5)
nRBC: 0 % (ref 0.0–0.2)

## 2024-01-10 LAB — RETICULOCYTES
Immature Retic Fract: 36.9 % — ABNORMAL HIGH (ref 2.3–15.9)
RBC.: 3.5 MIL/uL — ABNORMAL LOW (ref 4.22–5.81)
Retic Count, Absolute: 43.4 K/uL (ref 19.0–186.0)
Retic Ct Pct: 1.2 % (ref 0.4–3.1)

## 2024-01-10 LAB — COMPREHENSIVE METABOLIC PANEL WITH GFR
ALT: 94 U/L — ABNORMAL HIGH (ref 0–44)
AST: 151 U/L — ABNORMAL HIGH (ref 15–41)
Albumin: 1.5 g/dL — ABNORMAL LOW (ref 3.5–5.0)
Alkaline Phosphatase: 163 U/L — ABNORMAL HIGH (ref 38–126)
Anion gap: 7 (ref 5–15)
BUN: 5 mg/dL — ABNORMAL LOW (ref 6–20)
CO2: 24 mmol/L (ref 22–32)
Calcium: 7.5 mg/dL — ABNORMAL LOW (ref 8.9–10.3)
Chloride: 103 mmol/L (ref 98–111)
Creatinine, Ser: 0.74 mg/dL (ref 0.61–1.24)
GFR, Estimated: >60 mL/min (ref >60)
Glucose, Bld: 96 mg/dL (ref 70–99)
Potassium: 3.8 mmol/L (ref 3.5–5.1)
Sodium: 134 mmol/L — ABNORMAL LOW (ref 135–145)
Total Bilirubin: 1.8 mg/dL — ABNORMAL HIGH (ref 0.0–1.2)
Total Protein: 5.4 g/dL — ABNORMAL LOW (ref 6.5–8.1)

## 2024-01-10 MED ORDER — HEPATITIS A VACCINE 1440 EL U/ML IM SUSP
1.0000 mL | Freq: Once | INTRAMUSCULAR | Status: AC
  Administered 2024-01-13: 1440 [IU] via INTRAMUSCULAR
  Filled 2024-01-10 (×2): qty 1

## 2024-01-10 MED ORDER — HEPATITIS B VAC RECOMB ADJ 20 MCG/0.5ML IM SOSY
0.5000 mL | PREFILLED_SYRINGE | Freq: Once | INTRAMUSCULAR | Status: AC
  Administered 2024-01-11: 0.5 mL via INTRAMUSCULAR
  Filled 2024-01-10 (×2): qty 0.5

## 2024-01-10 MED ORDER — OXYCODONE HCL 5 MG PO TABS
10.0000 mg | ORAL_TABLET | ORAL | Status: DC | PRN
  Administered 2024-01-10 – 2024-01-15 (×19): 10 mg via ORAL
  Filled 2024-01-10 (×19): qty 2

## 2024-01-10 NOTE — Progress Notes (Signed)
 Orthopedics Progress Note  Subjective: Patient is comfortable on bed rest. Mod pain in the leg, has been elevating the leg  Objective:  Vitals:   01/10/24 0303 01/10/24 0914  BP: 103/67 114/73  Pulse: 87   Resp: 16   Temp: 98.7 F (37.1 C)   SpO2: 100%     General: Awake and alert  Musculoskeletal: Right LE bandaged with wound vac in place and functioning properly. He is able to wiggle toes and move his ankle with minimal pain Neurovascularly intact  Lab Results  Component Value Date   WBC 24.7 (H) 01/10/2024   HGB 8.7 (L) 01/10/2024   HCT 24.8 (L) 01/10/2024   MCV 71.3 (L) 01/10/2024   PLT 263 01/10/2024       Component Value Date/Time   NA 134 (L) 01/10/2024 0341   K 3.8 01/10/2024 0341   CL 103 01/10/2024 0341   CO2 24 01/10/2024 0341   GLUCOSE 96 01/10/2024 0341   BUN 5 (L) 01/10/2024 0341   CREATININE 0.74 01/10/2024 0341   CALCIUM 7.5 (L) 01/10/2024 0341   GFRNONAA >60 01/10/2024 0341    Lab Results  Component Value Date   INR 1.4 (H) 01/07/2024   INR 1.2 03/31/2021    Assessment/Plan: s/p Procedure(s): IRRIGATION AND DEBRIDEMENT WOUND APPLICATION, WOUND VAC Stable overnight. Wound vac functioning and dressing intact Continue bed rest and extremity elevation Dr Harden to see the patient on Monday per Dr Fidel.   Elspeth SAUNDERS. Kay, MD 01/10/2024 9:50 AM

## 2024-01-10 NOTE — Progress Notes (Signed)
 HD#3 SUBJECTIVE:  Patient Summary: Robert Eaton is a 38 y.o. with a pertinent PMH of COPD, polysubstance abuse, abuse disorder on Suboxone , alcohol use disorder without known withdrawal, and major depressive disorder, who presented with right lower extremity edema and jaundice and admitted for sepsis 2/2 right lower extermity cellulitis with hyperbilirubinemia and severe leukocytosis.   Overnight Events: none  Interim History: Overall doing well today without any new or worsening complaints.  He is still having some significant pain in his right lower extremity with some mild improvement with the oxycodone .  OBJECTIVE:  Vital Signs: Vitals:   01/09/24 1701 01/09/24 1909 01/09/24 2247 01/10/24 0303  BP: 107/71 106/75 103/64 103/67  Pulse: 96 100 98 87  Resp: 14 20 16 16   Temp: 99.9 F (37.7 C) (!) 100.4 F (38 C) 98.5 F (36.9 C) 98.7 F (37.1 C)  TempSrc: Oral Oral Oral Oral  SpO2: 99% 100% 100% 100%  Weight:    79.2 kg  Height:       Supplemental O2: Room Air SpO2: 100 % O2 Flow Rate (L/min): 2 L/min  Filed Weights   01/07/24 1300 01/10/24 0303  Weight: 79.4 kg 79.2 kg     Intake/Output Summary (Last 24 hours) at 01/10/2024 0616 Last data filed at 01/09/2024 1700 Gross per 24 hour  Intake 480 ml  Output 445 ml  Net 35 ml   Net IO Since Admission: 3,001.6 mL [01/10/24 0616]  Physical Exam: Constitutional: Well-appearing adult male. In no acute distress. Pulm:Normal work of breathing on room air. MSK: Right lower extremity wrapped in gauze and Ace wrap from the foot to the knee.  No ascending erythema or warmth.  Patient Lines/Drains/Airways Status     Active Line/Drains/Airways     Name Placement date Placement time Site Days   Peripheral IV 01/07/24 20 G Posterior;Right Hand 01/07/24  1254  Hand  1   Peripheral IV 01/07/24 18 G Anterior;Distal;Left;Upper Arm 01/07/24  1317  Arm  1            Pertinent labs and imaging:      Latest Ref Rng & Units  01/10/2024    3:41 AM 01/09/2024    3:29 AM 01/08/2024    6:22 AM  CBC  WBC 4.0 - 10.5 K/uL 24.7  36.4  43.7   Hemoglobin 13.0 - 17.0 g/dL 8.7  9.8  89.8   Hematocrit 39.0 - 52.0 % 24.8  28.3  28.2   Platelets 150 - 400 K/uL 263  238  244        Latest Ref Rng & Units 01/10/2024    3:41 AM 01/09/2024    3:29 AM 01/08/2024    6:22 AM  CMP  Glucose 70 - 99 mg/dL 96  891  890   BUN 6 - 20 mg/dL 5  6  8    Creatinine 0.61 - 1.24 mg/dL 9.25  9.26  9.21   Sodium 135 - 145 mmol/L 134  132  130   Potassium 3.5 - 5.1 mmol/L 3.8  3.9  3.9   Chloride 98 - 111 mmol/L 103  100  97   CO2 22 - 32 mmol/L 24  24  28    Calcium 8.9 - 10.3 mg/dL 7.5  7.7  7.7   Total Protein 6.5 - 8.1 g/dL 5.4  5.2  4.9   Total Bilirubin 0.0 - 1.2 mg/dL 1.8  4.4  4.3   Alkaline Phos 38 - 126 U/L 163  147  122  AST 15 - 41 U/L 151  141  108   ALT 0 - 44 U/L 94  97  92     No results found.   ASSESSMENT/PLAN:  Assessment: Principal Problem:   Fasciitis Active Problems:   Polysubstance abuse (HCC)   Alcohol use disorder   Opioid use disorder   Cellulitis of right lower extremity   Sepsis (HCC)   Jaundice   Hyperbilirubinemia   Hyponatremia   Hepatic steatosis  Robert Eaton is a 38 y.o. with a pertinent PMH of COPD, polysubstance abuse, abuse disorder on Suboxone , alcohol use disorder without known withdrawal, and major depressive disorder, who presented with right lower extremity edema and jaundice and admitted for sepsis 2/2 right lower extermity cellulitis with hyperbilirubinemia and severe leukocytosis.   Plan: #Sepsis d/t right lower extremity SSTI with abscess and fasciitis Stable symptoms with right lower extremity pain following I&D on 01/08/2024.  Intraoperative cultures show gram-positive cocci in pairs.  Currently on day 4 of antibiotics.  White count continues to improve to 25 today with fever of 100.4 overnight that broke with Tylenol . - Continue linezolid  and Zosyn  - Plan for return to the OR  early next week - Pending MRI of the right femur for hardware evaluation - Increase oxycodone  to 10 mg every 4 hours as needed - Continue Suboxone  8-2 mg 3 times daily  Sepsis induced cholestasis Bilirubin down to 1.8 today which is a significant improvement.  AST and ALT stable at 151/94 and alk phos 163. -Continue daily CMP - Hep A and B vaccine today  Hyponatremia Continues to improve with sodium 134 today.  Iliac and inguinal adenopathy Seen on admission CT and noted to be concerning for malignancy. Further clinical information regarding RLE extensive infection provided to radiology and with these findings they suggest reactive LAD. Recommend follow-up CT abdomen and pelvis to ensure no malignant cause after acute infection resolves during this admission.   Microcytic anemia Microcytosis around 71 with development of anemia after hemodilution and surgery with hemoglobin down to 8.7 today.  Platelets remain normal 263.  Previous smears have shown target cells and Dohle bodies.  Iron studies consistent with either iron deficiency or anemia of chronic disease with low iron and saturation but also low TIBC and high ferritin in the setting of acute infection.  No signs of bleeding other than from wound with over 200 mL out from the wound VAC.  Opiate Use Disorder Continue prior suboxone  dose 8-2mg  TID.  COPD Continue albuterol  PRN. No signs of respiratory distress or COPD exacerbation.   Best Practice: Diet: Regular diet IVF: None VTE: enoxaparin  (LOVENOX ) injection 40 mg Start: 01/09/24 1000 SCDs Start: 01/09/24 0716 Code: Full  Disposition planning: Therapy Recs: None, DME: none Family Contact: dad DISPO: Anticipated discharge planning pending return to the OR next week  Signature:  Fairy Jolaine [facility]  6:16 AM, 01/10/2024  On Call pager [phone]

## 2024-01-11 ENCOUNTER — Inpatient Hospital Stay (HOSPITAL_COMMUNITY)

## 2024-01-11 LAB — COMPREHENSIVE METABOLIC PANEL WITH GFR
ALT: 75 U/L — ABNORMAL HIGH (ref 0–44)
AST: 87 U/L — ABNORMAL HIGH (ref 15–41)
Albumin: <1.5 g/dL — ABNORMAL LOW (ref 3.5–5.0)
Alkaline Phosphatase: 160 U/L — ABNORMAL HIGH (ref 38–126)
Anion gap: 9 (ref 5–15)
BUN: <5 mg/dL — ABNORMAL LOW (ref 6–20)
CO2: 23 mmol/L (ref 22–32)
Calcium: 7.6 mg/dL — ABNORMAL LOW (ref 8.9–10.3)
Chloride: 102 mmol/L (ref 98–111)
Creatinine, Ser: 0.71 mg/dL (ref 0.61–1.24)
GFR, Estimated: >60 mL/min (ref >60)
Glucose, Bld: 104 mg/dL — ABNORMAL HIGH (ref 70–99)
Potassium: 3.9 mmol/L (ref 3.5–5.1)
Sodium: 134 mmol/L — ABNORMAL LOW (ref 135–145)
Total Bilirubin: 1.3 mg/dL — ABNORMAL HIGH (ref 0.0–1.2)
Total Protein: 5.8 g/dL — ABNORMAL LOW (ref 6.5–8.1)

## 2024-01-11 LAB — CBC WITH DIFFERENTIAL/PLATELET
Abs Immature Granulocytes: 1.12 K/uL — ABNORMAL HIGH (ref 0.00–0.07)
Basophils Absolute: 0.1 K/uL (ref 0.0–0.1)
Basophils Relative: 0 %
Eosinophils Absolute: 0.1 K/uL (ref 0.0–0.5)
Eosinophils Relative: 1 %
HCT: 24.5 % — ABNORMAL LOW (ref 39.0–52.0)
Hemoglobin: 8.5 g/dL — ABNORMAL LOW (ref 13.0–17.0)
Immature Granulocytes: 6 %
Lymphocytes Relative: 15 %
Lymphs Abs: 2.9 K/uL (ref 0.7–4.0)
MCH: 24.7 pg — ABNORMAL LOW (ref 26.0–34.0)
MCHC: 34.7 g/dL (ref 30.0–36.0)
MCV: 71.2 fL — ABNORMAL LOW (ref 80.0–100.0)
Monocytes Absolute: 1.5 K/uL — ABNORMAL HIGH (ref 0.1–1.0)
Monocytes Relative: 8 %
Neutro Abs: 13.4 K/uL — ABNORMAL HIGH (ref 1.7–7.7)
Neutrophils Relative %: 70 %
Platelets: 416 K/uL — ABNORMAL HIGH (ref 150–400)
RBC: 3.44 MIL/uL — ABNORMAL LOW (ref 4.22–5.81)
RDW: 15.9 % — ABNORMAL HIGH (ref 11.5–15.5)
Smear Review: NORMAL
WBC: 19.1 K/uL — ABNORMAL HIGH (ref 4.0–10.5)
nRBC: 0 % (ref 0.0–0.2)

## 2024-01-11 MED ORDER — SODIUM CHLORIDE 0.9 % IV SOLN: 3.0000 g | Freq: Four times a day (QID) | INTRAVENOUS | Status: DC

## 2024-01-11 MED ORDER — GADOBUTROL 1 MMOL/ML IV SOLN
8.0000 mL | Freq: Once | INTRAVENOUS | Status: AC | PRN
  Administered 2024-01-11: 8 mL via INTRAVENOUS

## 2024-01-11 MED ORDER — SODIUM CHLORIDE 0.9 % IV SOLN
3.0000 g | Freq: Four times a day (QID) | INTRAVENOUS | Status: DC
  Administered 2024-01-11 – 2024-01-22 (×55): 3 g via INTRAVENOUS
  Filled 2024-01-11 (×47): qty 8

## 2024-01-11 MED ORDER — FLUTICASONE PROPIONATE 50 MCG/ACT NA SUSP
1.0000 | Freq: Every day | NASAL | Status: DC
  Administered 2024-01-11 – 2024-01-26 (×3): 1 via NASAL
  Filled 2024-01-11: qty 16

## 2024-01-11 NOTE — Progress Notes (Signed)
 Pharmacy Antibiotic Note  Robert Eaton is a 38 y.o. male admitted on 01/07/2024 with cellulitis.  Pharmacy has been consulted for Unsayn dosing. De-escalating from Zosyn  to Unasyn  d/t cultures resulting Group A Strep. Pharmacy signing off.  Plan: Stop Zosyn  Start Unasyn  3g q6hours  Height: 5' 9 (175.3 cm) Weight: 79.2 kg (174 lb 9.7 oz) IBW/kg (Calculated) : 70.7  Temp (24hrs), Avg:98.7 F (37.1 C), Min:98.5 F (36.9 C), Max:99.1 F (37.3 C)  Recent Labs  Lab 01/07/24 1320 01/07/24 1607 01/07/24 1803 01/08/24 0622 01/09/24 0329 01/10/24 0341 01/11/24 0228  WBC 54.3*  --   --  43.7* 36.4* 24.7* 19.1*  CREATININE 0.94  --   --  0.78 0.73 0.74 0.71  LATICACIDVEN  --  4.3* 2.1* 1.4  --   --   --     Estimated Creatinine Clearance: 125.2 mL/min (by C-G formula based on SCr of 0.71 mg/dL).    No Known Allergies  Antimicrobials this admission: Zosyn  7/30 >> 8/3 Linezolid  7/31 >> (8/7) Cefazolin  7/31  Microbiology results: 7/31 Wound Cx (tissue from Right Leg): Group A Strep 7/30 Bcx x2 NGTD  Thank you for allowing pharmacy to be a part of this patient's care.  Deshonda Cryderman L Kyrra Prada 01/11/2024 11:02 AM

## 2024-01-11 NOTE — Progress Notes (Addendum)
 HD#4 SUBJECTIVE:  Patient Summary: Robert Eaton is a 38 y.o. with a pertinent PMH of COPD, polysubstance abuse, abuse disorder on Suboxone , alcohol use disorder without known withdrawal, and major depressive disorder, who presented with right lower extremity edema and jaundice and admitted for sepsis 2/2 right lower extermity cellulitis with hyperbilirubinemia and severe leukocytosis.   Overnight Events: none  Interim History: Overall doing well today without any new or worsening complaints. Denies fever, chills, SOB, or new pain  He is still having some significant pain in his right lower extremity with some mild improvement with the oxycodone .  OBJECTIVE:  Vital Signs: Vitals:   01/10/24 2158 01/10/24 2343 01/11/24 0344 01/11/24 0739  BP:  106/70 107/68 (!) 105/17  Pulse: 95 97 100 84  Resp: 17 19 15 16   Temp:  98.6 F (37 C) 99 F (37.2 C) 98.5 F (36.9 C)  TempSrc:  Oral Oral Oral  SpO2: 97% 92% 95% 96%  Weight:      Height:       Supplemental O2: Room Air SpO2: 96 % O2 Flow Rate (L/min): 2 L/min  Filed Weights   01/07/24 1300 01/10/24 0303  Weight: 79.4 kg 79.2 kg     Intake/Output Summary (Last 24 hours) at 01/11/2024 0908 Last data filed at 01/11/2024 0600 Gross per 24 hour  Intake 2710.63 ml  Output 4180 ml  Net -1469.37 ml   Net IO Since Admission: 332.23 mL [01/11/24 0908]  Physical Exam: Constitutional: Well-appearing adult male. In no acute distress. Pulm:Normal work of breathing on room air. MSK: Right lower extremity wrapped in gauze and Ace wrap from the foot to the knee.  No ascending erythema or warmth.  Patient Lines/Drains/Airways Status     Active Line/Drains/Airways     Name Placement date Placement time Site Days   Peripheral IV 01/07/24 20 G Posterior;Right Hand 01/07/24  1254  Hand  1   Peripheral IV 01/07/24 18 G Anterior;Distal;Left;Upper Arm 01/07/24  1317  Arm  1            Pertinent labs and imaging:      Latest Ref Rng &  Units 01/11/2024    2:28 AM 01/10/2024    3:41 AM 01/09/2024    3:29 AM  CBC  WBC 4.0 - 10.5 K/uL 19.1  24.7  36.4   Hemoglobin 13.0 - 17.0 g/dL 8.5  8.7  9.8   Hematocrit 39.0 - 52.0 % 24.5  24.8  28.3   Platelets 150 - 400 K/uL 416  263  238        Latest Ref Rng & Units 01/11/2024    2:28 AM 01/10/2024    3:41 AM 01/09/2024    3:29 AM  CMP  Glucose 70 - 99 mg/dL 895  96  891   BUN 6 - 20 mg/dL 5  5  6    Creatinine 0.61 - 1.24 mg/dL 9.28  9.25  9.26   Sodium 135 - 145 mmol/L 134  134  132   Potassium 3.5 - 5.1 mmol/L 3.9  3.8  3.9   Chloride 98 - 111 mmol/L 102  103  100   CO2 22 - 32 mmol/L 23  24  24    Calcium 8.9 - 10.3 mg/dL 7.6  7.5  7.7   Total Protein 6.5 - 8.1 g/dL 5.8  5.4  5.2   Total Bilirubin 0.0 - 1.2 mg/dL 1.3  1.8  4.4   Alkaline Phos 38 - 126 U/L 160  163  147   AST 15 - 41 U/L 87  151  141   ALT 0 - 44 U/L 75  94  97     No results found.   ASSESSMENT/PLAN:  Assessment: Principal Problem:   Fasciitis Active Problems:   Polysubstance abuse (HCC)   Alcohol use disorder   Opioid use disorder   Cellulitis of right lower extremity   Sepsis (HCC)   Jaundice   Hyperbilirubinemia   Hyponatremia   Hepatic steatosis   Severe protein-calorie malnutrition (HCC)  Robert Eaton is a 38 y.o. with a pertinent PMH of COPD, polysubstance abuse, abuse disorder on Suboxone , alcohol use disorder without known withdrawal, and major depressive disorder, who presented with right lower extremity edema and jaundice and admitted for sepsis 2/2 right lower extermity cellulitis with hyperbilirubinemia and severe leukocytosis.   Plan: #Sepsis d/t right lower extremity SSTI with abscess and fasciitis Stable symptoms with right lower extremity pain following I&D on 01/08/2024.  Intraoperative cultures show gram-positive cocci in pairs.  Currently on day 5 of antibiotics.  White count continues to improve to 19 today with no overnight fever. - Continue linezolid   -- Wound culture  positive for Strep bacteria. Will cancel IV Zosyn . - Plan for return to the OR early next week - Pending MRI of the right femur for hardware evaluation - Increase oxycodone  to 10 mg every 4 hours as needed - Continue Suboxone  8-2 mg 3 times daily  Sepsis induced cholestasis Bilirubin down to 1.3 today which is a significant improvement.  AST and ALT stable at 87/75 and alk phos 160. -Continue daily CMP - Hep A and B vaccine today   Hyponatremia Continues to improve with sodium 134 today.  Iliac and inguinal adenopathy Seen on admission CT and noted to be concerning for malignancy. Further clinical information regarding RLE extensive infection provided to radiology and with these findings they suggest reactive LAD. Recommend follow-up CT abdomen and pelvis to ensure no malignant cause after acute infection resolves during this admission.   Microcytic anemia Microcytosis around 71 with development of anemia after hemodilution and surgery with hemoglobin down to 8.5 today.  Platelets slightly elevated at 416.  Previous smears have shown target cells and Dohle bodies.  Iron studies consistent with either iron deficiency or anemia of chronic disease with low iron and saturation but also low TIBC and high ferritin in the setting of acute infection.  No signs of bleeding other than from wound with over 125 mL out from the wound VAC.  Opiate Use Disorder Continue prior suboxone  dose 8-2mg  TID.  COPD Continue albuterol  PRN. No signs of respiratory distress or COPD exacerbation.   Best Practice: Diet: Regular diet IVF: None VTE: enoxaparin  (LOVENOX ) injection 40 mg Start: 01/09/24 1000 SCDs Start: 01/09/24 0716 Code: Full  Disposition planning: Therapy Recs: None, DME: none Family Contact: dad DISPO: Anticipated discharge planning pending return to the OR next week  Signature:  Jaden [facility]  9:08 AM, 01/11/2024  On Call pager [phone]    Internal Medicine Attending:  I personally saw and examined the patient. Discussed with the resident and agree with the resident's findings and plan of care as documented in the resident's note. Will add unasyn  as well Eben Reyes BROCKS, MD

## 2024-01-12 DIAGNOSIS — M726 Necrotizing fasciitis: Secondary | ICD-10-CM

## 2024-01-12 DIAGNOSIS — A419 Sepsis, unspecified organism: Secondary | ICD-10-CM | POA: Diagnosis not present

## 2024-01-12 DIAGNOSIS — B950 Streptococcus, group A, as the cause of diseases classified elsewhere: Secondary | ICD-10-CM | POA: Diagnosis not present

## 2024-01-12 LAB — CBC
HCT: 23 % — ABNORMAL LOW (ref 39.0–52.0)
Hemoglobin: 8 g/dL — ABNORMAL LOW (ref 13.0–17.0)
MCH: 25.4 pg — ABNORMAL LOW (ref 26.0–34.0)
MCHC: 34.8 g/dL (ref 30.0–36.0)
MCV: 73 fL — ABNORMAL LOW (ref 80.0–100.0)
Platelets: 464 K/uL — ABNORMAL HIGH (ref 150–400)
RBC: 3.15 MIL/uL — ABNORMAL LOW (ref 4.22–5.81)
RDW: 16.3 % — ABNORMAL HIGH (ref 11.5–15.5)
WBC: 14.4 K/uL — ABNORMAL HIGH (ref 4.0–10.5)
nRBC: 0 % (ref 0.0–0.2)

## 2024-01-12 LAB — CULTURE, BLOOD (ROUTINE X 2)
Culture: NO GROWTH
Culture: NO GROWTH
Special Requests: ADEQUATE

## 2024-01-12 LAB — COMPREHENSIVE METABOLIC PANEL WITH GFR
ALT: 54 U/L — ABNORMAL HIGH (ref 0–44)
AST: 46 U/L — ABNORMAL HIGH (ref 15–41)
Albumin: <1.5 g/dL — ABNORMAL LOW (ref 3.5–5.0)
Alkaline Phosphatase: 130 U/L — ABNORMAL HIGH (ref 38–126)
Anion gap: 6 (ref 5–15)
BUN: <5 mg/dL — ABNORMAL LOW (ref 6–20)
CO2: 24 mmol/L (ref 22–32)
Calcium: 7.8 mg/dL — ABNORMAL LOW (ref 8.9–10.3)
Chloride: 104 mmol/L (ref 98–111)
Creatinine, Ser: 0.77 mg/dL (ref 0.61–1.24)
GFR, Estimated: >60 mL/min (ref >60)
Glucose, Bld: 102 mg/dL — ABNORMAL HIGH (ref 70–99)
Potassium: 4.1 mmol/L (ref 3.5–5.1)
Sodium: 134 mmol/L — ABNORMAL LOW (ref 135–145)
Total Bilirubin: 0.8 mg/dL (ref 0.0–1.2)
Total Protein: 5.8 g/dL — ABNORMAL LOW (ref 6.5–8.1)

## 2024-01-12 NOTE — Consult Note (Signed)
 ORTHOPAEDIC CONSULTATION  REQUESTING PHYSICIAN: Francesco Elsie NOVAK, MD  Chief Complaint: Pain and swelling right leg  HPI: Robert Eaton is a 38 y.o. male who presents with a 2-week history of right leg pain and swelling.  Patient is status post urgent debridement for the right leg with Dr. Fidel on Friday.  Past Medical History:  Diagnosis Date   COPD (chronic obstructive pulmonary disease) (HCC)    Irregular heartbeat    Past Surgical History:  Procedure Laterality Date   APPLICATION OF WOUND VAC Right 01/08/2024   Procedure: APPLICATION, WOUND VAC;  Surgeon: Fidel Rogue, MD;  Location: [facility] OR;  Service: Orthopedics;  Laterality: Right;   INCISION AND DRAINAGE OF WOUND Right 01/08/2024   Procedure: IRRIGATION AND DEBRIDEMENT WOUND;  Surgeon: Fidel Rogue, MD;  Location: [facility] OR;  Service: Orthopedics;  Laterality: Right;  LOWER LEG AND FOOT WOUND VAC   LEG SURGERY Right    Social History   Socioeconomic History   Marital status: Single    Spouse name: Not on file   Number of children: Not on file   Years of education: Not on file   Highest education level: Not on file  Occupational History   Not on file  Tobacco Use   Smoking status: Every Day    Current packs/day: 1.50    Types: Cigarettes    Passive exposure: Current   Smokeless tobacco: Never  Substance and Sexual Activity   Alcohol use: Yes    Comment: several beers per day   Drug use: Yes    Types: Marijuana   Sexual activity: Not Currently  Other Topics Concern   Not on file  Social History Narrative   Not on file   Social Drivers of Health   Financial Resource Strain: Not on file  Food Insecurity: Unknown (01/08/2024)   Hunger Vital Sign    Worried About Running Out of Food in the Last Year: Never true    Ran Out of Food in the Last Year: Not on file  Transportation Needs: Unknown (01/08/2024)   PRAPARE - Administrator, Civil Service (Medical): No    Lack of Transportation  (Non-Medical): Not on file  Physical Activity: Not on file  Stress: Not on file  Social Connections: Not on file   History reviewed. No pertinent family history. - negative except otherwise stated in the family history section No Known Allergies Prior to Admission medications   Medication Sig Start Date End Date Taking? Authorizing Provider  albuterol  (VENTOLIN  HFA) 108 (90 Base) MCG/ACT inhaler Inhale 1-2 puffs into the lungs every 6 (six) hours as needed for wheezing or shortness of breath. 03/25/21  Yes Fondaw, Wylder S, PA  buprenorphine  (SUBUTEX ) 8 MG SUBL SL tablet Place 24 mg under the tongue daily.   Yes [provider]  pantoprazole  (PROTONIX ) 20 MG tablet Take 40 mg by mouth daily.   Yes [provider]  Buprenorphine  HCl-Naloxone  HCl (SUBOXONE ) 8-2 MG FILM Place 1 Film under the tongue 3 (three) times daily. Patient not taking: Reported on 01/07/2024    [provider]  nicotine  polacrilex (NICORETTE ) 2 MG gum Take 1 each (2 mg total) by mouth as needed for smoking cessation. Patient not taking: Reported on 01/07/2024 09/21/23   Ntuen, Tina C, FNP  pantoprazole  (PROTONIX ) 40 MG tablet Take 1 tablet (40 mg total) by mouth daily. Patient not taking: Reported on 01/07/2024 09/22/23   Ntuen, Tina C, FNP   MR FEMUR RIGHT W  WO CONTRAST Result Date: 01/11/2024 CLINICAL DATA:  Right lower extremity intramuscular abscess and fasciitis status post incision and drainage and debridement. EXAM: MRI OF THE RIGHT FEMUR WITHOUT AND WITH CONTRAST TECHNIQUE: Multiplanar, multisequence MR imaging of the right femur was performed both before and after administration of intravenous contrast. CONTRAST:  8mL GADAVIST  GADOBUTROL  1 MMOL/ML IV SOLN COMPARISON:  None Available. FINDINGS: Bones/Joint/Cartilage Postoperative changes related to ORIF of a healed mid right femoral diaphyseal fracture. Indwelling orthopedic hardware with associated susceptibility artifact limits evaluation of the  surrounding bone and soft tissues. No convincing periprosthetic signal abnormality or enhancement to suggest hardware complication. The remainder of the visualized bones demonstrate normal marrow signal. Ligaments Suboptimally evaluated on this exam. Soft tissue/ Muscles and Tendons Diffuse circumferential subcutaneous edema extending from the thigh through the knee. Edema extends along the posterior greater than anterior deep fascia without significant enhancement. Intramuscular fat planes are maintained. No loculated fluid collection. Quadriceps tendon is intact. Prominent enlarged right inguinal lymph nodes are likely reactive. IMPRESSION: 1. Diffuse circumferential subcutaneous edema extending from the thigh through the knee. Cellulitis is not excluded. Edema extends along the posterior greater than anterior deep fascia without significant enhancement. Intramuscular fat planes are maintained. Fasciitis cannot be entirely excluded. No loculated fluid collection. 2. Postoperative changes related to ORIF of a healed mid right femoral diaphyseal fracture. Indwelling orthopedic hardware with associated susceptibility artifact limits evaluation of the surrounding bone and soft tissues. No evidence of hardware complication. 3. Prominent enlarged right inguinal lymph nodes are likely reactive. Electronically Signed   By: Harrietta Sherry M.D.   On: 01/11/2024 13:25   - pertinent xrays, CT, MRI studies were reviewed and independently interpreted  Positive ROS: All other systems have been reviewed and were otherwise negative with the exception of those mentioned in the HPI and as above.  Physical Exam: General: Alert, no acute distress Psychiatric: Patient is competent for consent with normal mood and affect Lymphatic: No axillary or cervical lymphadenopathy Cardiovascular: No pedal edema Respiratory: No cyanosis, no use of accessory musculature GI: No organomegaly, abdomen is soft and  non-tender    Images:  @ENCIMAGES @  Labs:  Lab Results  Component Value Date   HGBA1C 5.6 09/18/2023   CRP 16.7 (H) 01/09/2024   REPTSTATUS PENDING 01/08/2024   GRAMSTAIN  01/08/2024    NO WBC SEEN RARE GRAM POSITIVE COCCI IN PAIRS Performed at [facility], [address], [place], [place] [address]    CULT  01/08/2024    FEW STREPTOCOCCUS PYOGENES Beta hemolytic streptococci are predictably susceptible to penicillin and other beta lactams. Susceptibility testing not routinely performed. NO ANAEROBES ISOLATED; CULTURE IN PROGRESS FOR 5 DAYS     Lab Results  Component Value Date   ALBUMIN  <1.5 (L) 01/12/2024   ALBUMIN  <1.5 (L) 01/11/2024   ALBUMIN  1.5 (L) 01/10/2024        Latest Ref Rng & Units 01/12/2024    3:39 AM 01/11/2024    2:28 AM 01/10/2024    7:30 AM  CBC EXTENDED  WBC 4.0 - 10.5 K/uL 14.4  19.1    RBC 4.22 - 5.81 MIL/uL 3.15  3.44  3.50   Hemoglobin 13.0 - 17.0 g/dL 8.0  8.5    HCT 60.9 - 52.0 % 23.0  24.5    Platelets 150 - 400 K/uL 464  416    NEUT# 1.7 - 7.7 K/uL  13.4    Lymph# 0.7 - 4.0 K/uL  2.9  Neurologic: Patient does not have protective sensation bilateral lower extremities.   MUSCULOSKELETAL:   Skin: Examination patient has a installation wound VAC in place.  Tissue cultures are positive for strep pyogenes.  Patient's LRINEC score is 10.  White cell count has decreased to 14.4 from 54.  Assessment: Assessment: Necrotizing fasciitis right leg.  Plan: Plan: Will plan for return to the operating room on Wednesday for repeat debridement.  Possible return to the operating room on Friday as well.  Thank you for the consult and the opportunity to see Mr. Robert Bognar, MD [facility] [phone] 7:46 AM

## 2024-01-12 NOTE — Plan of Care (Signed)
  Problem: Clinical Measurements: Goal: Ability to avoid or minimize complications of infection will improve Outcome: Progressing   Problem: Skin Integrity: Goal: Skin integrity will improve Outcome: Progressing   Problem: Education: Goal: Knowledge of General Education information will improve Description: Including pain rating scale, medication(s)/side effects and non-pharmacologic comfort measures Outcome: Progressing   Problem: Health Behavior/Discharge Planning: Goal: Ability to manage health-related needs will improve Outcome: Progressing   Problem: Clinical Measurements: Goal: Ability to maintain clinical measurements within normal limits will improve Outcome: Progressing Goal: Will remain free from infection Outcome: Progressing   Problem: Activity: Goal: Risk for activity intolerance will decrease Outcome: Progressing

## 2024-01-12 NOTE — Progress Notes (Signed)
 HD#5 SUBJECTIVE:  Patient Summary: Robert Eaton is a 38 y.o. with a pertinent PMH of COPD, polysubstance abuse, abuse disorder on Suboxone , alcohol use disorder without known withdrawal, and major depressive disorder, who presented with right lower extremity edema and jaundice and admitted for sepsis 2/2 right lower extermity cellulitis with hyperbilirubinemia and severe leukocytosis.   Overnight Events: none  Interim History: Overall doing well today without any new or worsening complaints. Denies fever, chills, SOB, or new pain. Reports pain is improving with Oxycodone  10mg . He reports appetite is normal, has had normal bowel function, and is able to urinate without issue.   OBJECTIVE:  Vital Signs: Vitals:   01/12/24 0039 01/12/24 0445 01/12/24 0729 01/12/24 1119  BP: 106/65 95/65 109/74 102/68  Pulse:   88 90  Resp: 16 17 12 14   Temp: 99.6 F (37.6 C) 99.5 F (37.5 C) 98.7 F (37.1 C) 99.2 F (37.3 C)  TempSrc: Oral Oral Oral Oral  SpO2: 100% 100% 96% 95%  Weight:      Height:       Supplemental O2: Room Air SpO2: 95 % O2 Flow Rate (L/min): 2 L/min  Filed Weights   01/07/24 1300 01/10/24 0303  Weight: 79.4 kg 79.2 kg     Intake/Output Summary (Last 24 hours) at 01/12/2024 1345 Last data filed at 01/12/2024 1200 Gross per 24 hour  Intake 782.21 ml  Output 3625 ml  Net -2842.79 ml   Net IO Since Admission: -2,270.56 mL [01/12/24 1345]  Physical Exam: Constitutional: Well-appearing adult male. In no acute distress. Pulm:Normal work of breathing on room air. MSK: Right lower extremity wrapped in gauze and Ace wrap from the foot to the knee.  No ascending erythema or warmth.  Patient Lines/Drains/Airways Status     Active Line/Drains/Airways     Name Placement date Placement time Site Days   Peripheral IV 01/07/24 20 G Posterior;Right Hand 01/07/24  1254  Hand  1   Peripheral IV 01/07/24 18 G Anterior;Distal;Left;Upper Arm 01/07/24  1317  Arm  1             Pertinent labs and imaging:      Latest Ref Rng & Units 01/12/2024    3:39 AM 01/11/2024    2:28 AM 01/10/2024    3:41 AM  CBC  WBC 4.0 - 10.5 K/uL 14.4  19.1  24.7   Hemoglobin 13.0 - 17.0 g/dL 8.0  8.5  8.7   Hematocrit 39.0 - 52.0 % 23.0  24.5  24.8   Platelets 150 - 400 K/uL 464  416  263        Latest Ref Rng & Units 01/12/2024    3:39 AM 01/11/2024    2:28 AM 01/10/2024    3:41 AM  CMP  Glucose 70 - 99 mg/dL 897  895  96   BUN 6 - 20 mg/dL <5  <5  5   Creatinine 0.61 - 1.24 mg/dL 9.22  9.28  9.25   Sodium 135 - 145 mmol/L 134  134  134   Potassium 3.5 - 5.1 mmol/L 4.1  3.9  3.8   Chloride 98 - 111 mmol/L 104  102  103   CO2 22 - 32 mmol/L 24  23  24    Calcium 8.9 - 10.3 mg/dL 7.8  7.6  7.5   Total Protein 6.5 - 8.1 g/dL 5.8  5.8  5.4   Total Bilirubin 0.0 - 1.2 mg/dL 0.8  1.3  1.8   Alkaline Phos  38 - 126 U/L 130  160  163   AST 15 - 41 U/L 46  87  151   ALT 0 - 44 U/L 54  75  94     No results found.    ASSESSMENT/PLAN:  Assessment: Principal Problem:   Fasciitis Active Problems:   Polysubstance abuse (HCC)   Alcohol use disorder   Opioid use disorder   Cellulitis of right lower extremity   Sepsis (HCC)   Jaundice   Hyperbilirubinemia   Hyponatremia   Hepatic steatosis   Severe protein-calorie malnutrition (HCC)   Necrotizing fasciitis due to Streptococcus pyogenes (HCC)  Robert Eaton is a 38 y.o. with a pertinent PMH of COPD, polysubstance abuse, abuse disorder on Suboxone , alcohol use disorder without known withdrawal, and major depressive disorder, who presented with right lower extremity edema and jaundice and admitted for sepsis 2/2 right lower extermity cellulitis with hyperbilirubinemia and severe leukocytosis.   Plan: #Sepsis d/t right lower extremity SSTI with abscess and fasciitis Stable symptoms with right lower extremity pain following I&D on 01/08/2024.  Intraoperative cultures show streptococcus pyogenes. Currently on day 6 of antibiotics,  tolerating well.  White count continues to improve to 14 today with no overnight fever. On IV Unasyn  and Linezolid . Wound vacuum functioning appropriately. MRI right femur showed diffuse circumferential subcutaneous edema extending from the thigh to the knee and cellulitis not excluded. Edema extends along the posterior greater than anterior deep fascia without significant enhancement. Intramuscular fat planes are maintained. Fasciitis cannot be entirely excluded, but no evidence of hardware complication. Dr. Harden saw patient for further evaluation of cellulitis and plans to take the patient for another debridement on Wednesday and potentially on Friday as well.  - Continue IV Linezolid  and Unasyn  - Plan for return to the OR Wednesday - Oxycodone  to 10 mg every 4 hours as needed - Continue Suboxone  8-2 mg 3 times daily  Sepsis induced cholestasis Bilirubin down to 0.8 today which is a significant improvement.  AST and ALT stable at 46/54 and alk phos 130. Received Hep B vaccine yesterday, will get Hep A vaccine today.  -Continue daily CMP - Hep A today   Hyponatremia Continues to improve with sodium 134 today.  Iliac and inguinal adenopathy Seen on admission CT and noted to be concerning for malignancy. Further clinical information regarding RLE extensive infection provided to radiology and with these findings they suggest reactive LAD. Recommend follow-up CT abdomen and pelvis to ensure no malignant cause after acute infection resolves during this admission.   Microcytic anemia Microcytosis around 71 with development of anemia after hemodilution and surgery with hemoglobin down to 8.0 today.  Platelets slightly elevated at 464.  Previous smears have shown target cells and Dohle bodies.  Iron studies consistent with either iron deficiency or anemia of chronic disease with low iron and saturation but also low TIBC and high ferritin in the setting of acute infection.  No signs of bleeding other than  from wound with over 125 mL out from the wound VAC.  Opiate Use Disorder Continue prior suboxone  dose 8-2mg  TID.  COPD Continue albuterol  PRN. No signs of respiratory distress or COPD exacerbation.   Best Practice: Diet: Regular diet IVF: None VTE: enoxaparin  (LOVENOX ) injection 40 mg Start: 01/09/24 1000 SCDs Start: 01/09/24 0716 Code: Full  Disposition planning: Therapy Recs: None, DME: none Family Contact: dad DISPO: Anticipated discharge planning pending return to the OR next week  Signature:  Carlisha Wisler [facility]  1:45 PM, 01/12/2024  On Call pager [phone]   Internal Medicine Attending:  I personally saw and examined the patient. Discussed with the resident and agree with the resident's findings and plan of care as documented in the resident's note. Will add unasyn  as well Kriss Perleberg, DO

## 2024-01-13 DIAGNOSIS — E876 Hypokalemia: Secondary | ICD-10-CM

## 2024-01-13 LAB — SURGICAL PCR SCREEN
MRSA, PCR: NEGATIVE
Staphylococcus aureus: NEGATIVE

## 2024-01-13 LAB — COMPREHENSIVE METABOLIC PANEL WITH GFR
ALT: 46 U/L — ABNORMAL HIGH (ref 0–44)
AST: 42 U/L — ABNORMAL HIGH (ref 15–41)
Albumin: 1.5 g/dL — ABNORMAL LOW (ref 3.5–5.0)
Alkaline Phosphatase: 123 U/L (ref 38–126)
Anion gap: 6 (ref 5–15)
BUN: <5 mg/dL — ABNORMAL LOW (ref 6–20)
CO2: 24 mmol/L (ref 22–32)
Calcium: 7.7 mg/dL — ABNORMAL LOW (ref 8.9–10.3)
Chloride: 102 mmol/L (ref 98–111)
Creatinine, Ser: 0.72 mg/dL (ref 0.61–1.24)
GFR, Estimated: >60 mL/min (ref >60)
Glucose, Bld: 134 mg/dL — ABNORMAL HIGH (ref 70–99)
Potassium: 3.8 mmol/L (ref 3.5–5.1)
Sodium: 132 mmol/L — ABNORMAL LOW (ref 135–145)
Total Bilirubin: 0.8 mg/dL (ref 0.0–1.2)
Total Protein: 6.5 g/dL (ref 6.5–8.1)

## 2024-01-13 LAB — CBC WITH DIFFERENTIAL/PLATELET
Basophils Absolute: 0.2 K/uL — ABNORMAL HIGH (ref 0.0–0.1)
Basophils Relative: 2 %
Eosinophils Absolute: 0.2 K/uL (ref 0.0–0.5)
Eosinophils Relative: 2 %
HCT: 23.5 % — ABNORMAL LOW (ref 39.0–52.0)
Hemoglobin: 7.8 g/dL — ABNORMAL LOW (ref 13.0–17.0)
Lymphocytes Relative: 5 %
Lymphs Abs: 0.4 K/uL — ABNORMAL LOW (ref 0.7–4.0)
MCH: 24.9 pg — ABNORMAL LOW (ref 26.0–34.0)
MCHC: 33.2 g/dL (ref 30.0–36.0)
MCV: 75.1 fL — ABNORMAL LOW (ref 80.0–100.0)
Monocytes Absolute: 0.3 K/uL (ref 0.1–1.0)
Monocytes Relative: 4 %
Neutro Abs: 7.5 K/uL (ref 1.7–7.7)
Neutrophils Relative %: 87 %
Platelets: 473 K/uL — ABNORMAL HIGH (ref 150–400)
RBC: 3.13 MIL/uL — ABNORMAL LOW (ref 4.22–5.81)
RDW: 16.6 % — ABNORMAL HIGH (ref 11.5–15.5)
WBC: 8.6 K/uL (ref 4.0–10.5)
nRBC: 0 % (ref 0.0–0.2)

## 2024-01-13 LAB — FLOW CYTOMETRY

## 2024-01-13 MED ORDER — CHLORHEXIDINE GLUCONATE 4 % EX SOLN
60.0000 mL | Freq: Once | CUTANEOUS | Status: AC
  Administered 2024-01-14: 4 via TOPICAL
  Filled 2024-01-13: qty 60

## 2024-01-13 MED ORDER — CHLORHEXIDINE GLUCONATE 4 % EX SOLN
60.0000 mL | Freq: Once | CUTANEOUS | Status: DC
  Filled 2024-01-13: qty 60

## 2024-01-13 MED ORDER — POVIDONE-IODINE 10 % EX SWAB
2.0000 | Freq: Once | CUTANEOUS | Status: AC
  Administered 2024-01-14: 2 via TOPICAL

## 2024-01-13 MED ORDER — CEFAZOLIN SODIUM-DEXTROSE 2-4 GM/100ML-% IV SOLN
2.0000 g | INTRAVENOUS | Status: AC
  Administered 2024-01-14: 2 g via INTRAVENOUS
  Filled 2024-01-13: qty 100

## 2024-01-13 NOTE — Plan of Care (Signed)
  Problem: Clinical Measurements: Goal: Ability to avoid or minimize complications of infection will improve Outcome: Progressing   Problem: Education: Goal: Knowledge of General Education information will improve Description: Including pain rating scale, medication(s)/side effects and non-pharmacologic comfort measures Outcome: Progressing   Problem: Health Behavior/Discharge Planning: Goal: Ability to manage health-related needs will improve Outcome: Progressing   Problem: Clinical Measurements: Goal: Ability to maintain clinical measurements within normal limits will improve Outcome: Progressing

## 2024-01-13 NOTE — Progress Notes (Signed)
 HD#6 SUBJECTIVE:  Patient Summary: Robert Eaton is a 38 y.o. with a pertinent PMH of COPD, polysubstance abuse, abuse disorder on Suboxone , alcohol use disorder without known withdrawal, and major depressive disorder, who presented with right lower extremity edema and jaundice and admitted for sepsis 2/2 right lower extermity cellulitis with hyperbilirubinemia and severe leukocytosis.   Overnight Events: none  Interim History: Patient reports feeling the same as he had yesterday. He has not had his pain meds yet this morning, but otherwise is feeling okay. He reports he is urinating normally, has not had a BM since two days ago. He feels like his appetite has returned and is eating and drinking normally.   OBJECTIVE:  Vital Signs: Vitals:   01/12/24 1916 01/13/24 0405 01/13/24 0622 01/13/24 0749  BP: 124/69 115/65  115/66  Pulse: 97 91  88  Resp: 16 16    Temp: 99.5 F (37.5 C) 99.1 F (37.3 C)  98.9 F (37.2 C)  TempSrc: Oral Oral  Oral  SpO2: 97% 94%  97%  Weight:   87.2 kg   Height:       Supplemental O2: Room Air SpO2: 97 % O2 Flow Rate (L/min): 2 L/min  Filed Weights   01/07/24 1300 01/10/24 0303 01/13/24 0622  Weight: 79.4 kg 79.2 kg 87.2 kg     Intake/Output Summary (Last 24 hours) at 01/13/2024 1108 Last data filed at 01/13/2024 1000 Gross per 24 hour  Intake 100 ml  Output 5975 ml  Net -5875 ml   Net IO Since Admission: -7,470.56 mL [01/13/24 1108]  Physical Exam: Constitutional: Well-appearing adult male. In no acute distress. Pulm:Normal work of breathing on room air. MSK: Right lower extremity wrapped in gauze and Ace wrap from the foot to the knee.  No ascending erythema or warmth.  Patient Lines/Drains/Airways Status     Active Line/Drains/Airways     Name Placement date Placement time Site Days   Peripheral IV 01/07/24 20 G Posterior;Right Hand 01/07/24  1254  Hand  1   Peripheral IV 01/07/24 18 G Anterior;Distal;Left;Upper Arm 01/07/24  1317  Arm   1            Pertinent labs and imaging:      Latest Ref Rng & Units 01/13/2024    6:23 AM 01/12/2024    3:39 AM 01/11/2024    2:28 AM  CBC  WBC 4.0 - 10.5 K/uL 8.6  14.4  19.1   Hemoglobin 13.0 - 17.0 g/dL 7.8  8.0  8.5   Hematocrit 39.0 - 52.0 % 23.5  23.0  24.5   Platelets 150 - 400 K/uL 473  464  416        Latest Ref Rng & Units 01/13/2024    6:23 AM 01/12/2024    3:39 AM 01/11/2024    2:28 AM  CMP  Glucose 70 - 99 mg/dL 865  897  895   BUN 6 - 20 mg/dL <5  <5  <5   Creatinine 0.61 - 1.24 mg/dL 9.27  9.22  9.28   Sodium 135 - 145 mmol/L 132  134  134   Potassium 3.5 - 5.1 mmol/L 3.8  4.1  3.9   Chloride 98 - 111 mmol/L 102  104  102   CO2 22 - 32 mmol/L 24  24  23    Calcium 8.9 - 10.3 mg/dL 7.7  7.8  7.6   Total Protein 6.5 - 8.1 g/dL 6.5  5.8  5.8   Total Bilirubin  0.0 - 1.2 mg/dL 0.8  0.8  1.3   Alkaline Phos 38 - 126 U/L 123  130  160   AST 15 - 41 U/L 42  46  87   ALT 0 - 44 U/L 46  54  75     No results found.    ASSESSMENT/PLAN:  Assessment: Principal Problem:   Fasciitis Active Problems:   Polysubstance abuse (HCC)   Alcohol use disorder   Opioid use disorder   Cellulitis of right lower extremity   Sepsis (HCC)   Jaundice   Hyperbilirubinemia   Hyponatremia   Hepatic steatosis   Severe protein-calorie malnutrition (HCC)   Necrotizing fasciitis due to Streptococcus pyogenes (HCC)  Robert Eaton is a 38 y.o. with a pertinent PMH of COPD, polysubstance abuse, abuse disorder on Suboxone , alcohol use disorder without known withdrawal, and major depressive disorder, who presented with right lower extremity edema and jaundice and admitted for sepsis 2/2 right lower extermity cellulitis with hyperbilirubinemia and severe leukocytosis.   Plan: #Sepsis d/t right lower extremity SSTI with abscess and fasciitis Stable symptoms with right lower extremity pain following I&D on 01/08/2024.  Intraoperative cultures show streptococcus pyogenes. Currently on day 7 of  antibiotics, tolerating well.  White count improved to 8.6  today with no overnight fever. On IV Unasyn  and Linezolid . Wound vacuum functioning appropriately. Dr. Harden saw patient for further evaluation of cellulitis and plans to take the patient for another debridement tomorrow and potentially on Friday as well.  - Continue IV Linezolid  and Unasyn  - Plan for return to the OR tomorrow -- NPO at midnight - Oxycodone  to 10 mg every 4 hours as needed - Continue Suboxone  8-2 mg 3 times daily  Sepsis induced cholestasis Bilirubin down to 0.8 today which is a significant improvement.  AST and ALT stable at 42 and 46 and alk phos 123.  Still pending patient getting Hep A vaccine today.  -Continue daily CMP - Hep A   Hyponatremia Continues to improve with sodium 132 today.  Iliac and inguinal adenopathy Seen on admission CT and noted to be concerning for malignancy. Further clinical information regarding RLE extensive infection provided to radiology and with these findings they suggest reactive LAD. Recommend follow-up CT abdomen and pelvis to ensure no malignant cause after acute infection resolves during this admission.   Microcytic anemia Microcytosis around 71 with development of anemia after hemodilution and surgery with hemoglobin down to 8.0 today.  Platelets slightly elevated at 473.  Previous smears have shown target cells and Dohle bodies.  Iron studies consistent with either iron deficiency or anemia of chronic disease with low iron and saturation but also low TIBC and high ferritin in the setting of acute infection.  No signs of bleeding other than from wound with appropriate drainage from the wound VAC.  Opiate Use Disorder Continue prior suboxone  dose 8-2mg  TID.  COPD Continue albuterol  PRN. No signs of respiratory distress or COPD exacerbation.   Best Practice: Diet: Regular diet IVF: None VTE: enoxaparin  (LOVENOX ) injection 40 mg Start: 01/09/24 1000 SCDs Start: 01/09/24  0716 Code: Full  Disposition planning: Therapy Recs: None, DME: none Family Contact: dad DISPO: Anticipated discharge planning pending return to the OR next week  Signature:  Sabatino Williard  Internal Medicine Residency  11:08 AM, 01/13/2024  On Call pager [phone]

## 2024-01-13 NOTE — H&P (View-Only) (Signed)
 Patient ID: Robert Eaton, male   DOB: [birth year], 38 y.o.   MRN: [id number] Patient is a 38 year old gentleman who is seen in follow-up after initial debridement for necrotizing fasciitis right lower extremity secondary to Streptococcus pyogenes.  Patient's white cell count has continued to improve currently 14.4.  Plan for repeat debridement and tissue grafting tomorrow.

## 2024-01-13 NOTE — Progress Notes (Signed)
 Patient ID: Robert Eaton, male   DOB: 08/22/1985, 38 y.o.   MRN: [id number] Patient is a 38 year old gentleman who is seen in follow-up after initial debridement for necrotizing fasciitis right lower extremity secondary to Streptococcus pyogenes.  Patient's white cell count has continued to improve currently 14.4.  Plan for repeat debridement and tissue grafting tomorrow.

## 2024-01-13 NOTE — TOC Initial Note (Signed)
 Transition of Care [facility]) - Initial/Assessment Note    Patient Details  Name: Robert Eaton MRN: [id number] Date of Birth: Oct 26, 1985  Transition of Care [facility]) CM/SW Contact:    Robert FORBES Saa, LCSW Phone Number: 01/13/2024, 9:14 AM  Clinical Narrative:                  9:15 AM Per chart review, patient is from [facility] [place] where he has been the past two weeks. Patient does not have SNF/HH/DME history. Patient has insurance but does not have a PCP listed. TOC will continue to follow and be available to assist.  Expected Discharge Plan:  [facility]) Barriers to Discharge: Continued Medical Work up   Patient Goals and CMS Choice            Expected Discharge Plan and Services       Living arrangements for the past 2 months:  [facility])                                      Prior Living Arrangements/Services [facility] the [facility] 2 [facility]   Patient language and need for interpreter reviewed:: Yes              Criminal Activity/Legal Involvement Pertinent to Current Situation/Hospitalization: No - Comment as needed  Activities of Daily Living      Permission Sought/Granted Permission sought to share information with : Family Supports Permission granted to share information with : No (Contact information on chart)  Share Information with NAME: Robert Eaton     Permission granted to share info w Relationship: Father  Permission granted to share info w Contact Information: [phone]  Emotional Assessment       Orientation: : Oriented to Self, Oriented to Place, Oriented to  Time, Oriented to Situation Alcohol / Substance Use: Not Applicable Psych Involvement: No (comment)  Admission diagnosis:  Hypokalemia [E87.6] Hyperbilirubinemia [E80.6] Hyponatremia [E87.1] Jaundice [R17] Hepatic steatosis [K76.0] Cellulitis of right lower extremity  [L03.115] Biliary calculus of other site without obstruction [K80.80] Sepsis, due to unspecified organism, unspecified whether acute organ dysfunction present [facility]) [A41.9] Patient Active Problem List   Diagnosis Date Noted   Necrotizing fasciitis due to Streptococcus pyogenes (HCC) 01/12/2024   Severe protein-calorie malnutrition (HCC) 01/10/2024   Hepatic steatosis 01/09/2024   Sepsis (HCC) 01/08/2024   Jaundice 01/08/2024   Hyperbilirubinemia 01/08/2024   Hyponatremia 01/08/2024   Fasciitis 01/08/2024   Cellulitis of right lower extremity 01/07/2024   Alcohol use disorder 09/19/2023   Cocaine abuse (HCC) 09/19/2023   Opioid use disorder 09/19/2023   GERD (gastroesophageal reflux disease) 09/19/2023   Tobacco use disorder 09/19/2023   COPD (chronic obstructive pulmonary disease) (HCC)    Polysubstance abuse (HCC) 09/18/2023   PCP:  Pcp, No Pharmacy:   [facility] [phone] - [place], [place] - [address] AT [employer] [address] [place] [address] Phone: [phone] Fax: [phone]  [facility] - [place], [place] - [address] [place]. [address] [place] [address] Phone: [phone] Fax: [phone]  [facility] [address] [place] [address] Phone: [phone] Fax: [phone]     Social Drivers of Health (SDOH) Social History: SDOH Screenings   Food Insecurity: Unknown (01/08/2024)  Housing: Unknown (01/08/2024)  Transportation Needs: Unknown (01/08/2024)  Utilities: Not At Risk (01/08/2024)  Alcohol Screen: Low Risk  (09/18/2023)  Depression (PHQ2-9): Medium Risk (09/18/2023)  Tobacco Use: High Risk (01/08/2024)   SDOH Interventions:     Readmission Risk Interventions     No data to display

## 2024-01-13 NOTE — Plan of Care (Signed)
   Problem: Clinical Measurements: Goal: Ability to avoid or minimize complications of infection will improve Outcome: Progressing   Problem: Skin Integrity: Goal: Skin integrity will improve Outcome: Progressing   Problem: Education: Goal: Knowledge of General Education information will improve Description: Including pain rating scale, medication(s)/side effects and non-pharmacologic comfort measures Outcome: Progressing   Problem: Health Behavior/Discharge Planning: Goal: Ability to manage health-related needs will improve Outcome: Progressing   Problem: Clinical Measurements: Goal: Ability to maintain clinical measurements within normal limits will improve Outcome: Progressing Goal: Will remain free from infection Outcome: Progressing Goal: Diagnostic test results will improve Outcome: Progressing Goal: Respiratory complications will improve Outcome: Progressing Goal: Cardiovascular complication will be avoided Outcome: Progressing   Problem: Activity: Goal: Risk for activity intolerance will decrease Outcome: Progressing   Problem: Nutrition: Goal: Adequate nutrition will be maintained Outcome: Progressing   Problem: Coping: Goal: Level of anxiety will decrease Outcome: Progressing   Problem: Elimination: Goal: Will not experience complications related to bowel motility Outcome: Progressing Goal: Will not experience complications related to urinary retention Outcome: Progressing   Problem: Pain Managment: Goal: General experience of comfort will improve and/or be controlled Outcome: Progressing   Problem: Safety: Goal: Ability to remain free from injury will improve Outcome: Progressing   Problem: Skin Integrity: Goal: Risk for impaired skin integrity will decrease Outcome: Progressing

## 2024-01-14 ENCOUNTER — Encounter (HOSPITAL_COMMUNITY): Admission: EM | Payer: Self-pay | Attending: Internal Medicine

## 2024-01-14 ENCOUNTER — Other Ambulatory Visit: Payer: Self-pay

## 2024-01-14 ENCOUNTER — Inpatient Hospital Stay (HOSPITAL_COMMUNITY)

## 2024-01-14 ENCOUNTER — Encounter (HOSPITAL_COMMUNITY): Payer: Self-pay | Admitting: Internal Medicine

## 2024-01-14 DIAGNOSIS — M726 Necrotizing fasciitis: Secondary | ICD-10-CM

## 2024-01-14 DIAGNOSIS — A419 Sepsis, unspecified organism: Secondary | ICD-10-CM

## 2024-01-14 DIAGNOSIS — B950 Streptococcus, group A, as the cause of diseases classified elsewhere: Secondary | ICD-10-CM | POA: Diagnosis not present

## 2024-01-14 DIAGNOSIS — J449 Chronic obstructive pulmonary disease, unspecified: Secondary | ICD-10-CM

## 2024-01-14 DIAGNOSIS — F17210 Nicotine dependence, cigarettes, uncomplicated: Secondary | ICD-10-CM | POA: Diagnosis not present

## 2024-01-14 DIAGNOSIS — E876 Hypokalemia: Secondary | ICD-10-CM

## 2024-01-14 HISTORY — PX: APPLICATION OF WOUND VAC: SHX5189

## 2024-01-14 HISTORY — PX: INCISION AND DRAINAGE OF WOUND: SHX1803

## 2024-01-14 LAB — AEROBIC/ANAEROBIC CULTURE W GRAM STAIN (SURGICAL/DEEP WOUND)
Gram Stain: NONE SEEN
Gram Stain: NONE SEEN

## 2024-01-14 LAB — COMPREHENSIVE METABOLIC PANEL WITH GFR
ALT: 47 U/L — ABNORMAL HIGH (ref 0–44)
AST: 50 U/L — ABNORMAL HIGH (ref 15–41)
Albumin: 1.7 g/dL — ABNORMAL LOW (ref 3.5–5.0)
Alkaline Phosphatase: 122 U/L (ref 38–126)
Anion gap: 7 (ref 5–15)
BUN: <5 mg/dL — ABNORMAL LOW (ref 6–20)
CO2: 23 mmol/L (ref 22–32)
Calcium: 8.1 mg/dL — ABNORMAL LOW (ref 8.9–10.3)
Chloride: 103 mmol/L (ref 98–111)
Creatinine, Ser: 0.89 mg/dL (ref 0.61–1.24)
GFR, Estimated: >60 mL/min (ref >60)
Glucose, Bld: 108 mg/dL — ABNORMAL HIGH (ref 70–99)
Potassium: 4.2 mmol/L (ref 3.5–5.1)
Sodium: 133 mmol/L — ABNORMAL LOW (ref 135–145)
Total Bilirubin: 0.8 mg/dL (ref 0.0–1.2)
Total Protein: 7.2 g/dL (ref 6.5–8.1)

## 2024-01-14 LAB — CBC WITH DIFFERENTIAL/PLATELET
Abs Immature Granulocytes: 0.23 K/uL — ABNORMAL HIGH (ref 0.00–0.07)
Basophils Absolute: 0 K/uL (ref 0.0–0.1)
Basophils Relative: 1 %
Eosinophils Absolute: 0.1 K/uL (ref 0.0–0.5)
Eosinophils Relative: 1 %
HCT: 26.6 % — ABNORMAL LOW (ref 39.0–52.0)
Hemoglobin: 8.7 g/dL — ABNORMAL LOW (ref 13.0–17.0)
Immature Granulocytes: 3 %
Lymphocytes Relative: 30 %
Lymphs Abs: 2.4 K/uL (ref 0.7–4.0)
MCH: 25.1 pg — ABNORMAL LOW (ref 26.0–34.0)
MCHC: 32.7 g/dL (ref 30.0–36.0)
MCV: 76.7 fL — ABNORMAL LOW (ref 80.0–100.0)
Monocytes Absolute: 0.9 K/uL (ref 0.1–1.0)
Monocytes Relative: 11 %
Neutro Abs: 4.3 K/uL (ref 1.7–7.7)
Neutrophils Relative %: 54 %
Platelets: 551 K/uL — ABNORMAL HIGH (ref 150–400)
RBC: 3.47 MIL/uL — ABNORMAL LOW (ref 4.22–5.81)
RDW: 17.4 % — ABNORMAL HIGH (ref 11.5–15.5)
WBC: 7.9 K/uL (ref 4.0–10.5)
nRBC: 0 % (ref 0.0–0.2)

## 2024-01-14 SURGERY — IRRIGATION AND DEBRIDEMENT WOUND
Anesthesia: General | Site: Leg Lower | Laterality: Right

## 2024-01-14 MED ORDER — KETOROLAC TROMETHAMINE 15 MG/ML IJ SOLN
INTRAMUSCULAR | Status: DC | PRN
  Administered 2024-01-14: 15 mg via INTRAVENOUS

## 2024-01-14 MED ORDER — PROPOFOL 10 MG/ML IV BOLUS
INTRAVENOUS | Status: DC | PRN
  Administered 2024-01-14: 140 mg via INTRAVENOUS

## 2024-01-14 MED ORDER — FENTANYL CITRATE (PF) 250 MCG/5ML IJ SOLN
INTRAMUSCULAR | Status: DC | PRN
  Administered 2024-01-14: 50 ug via INTRAVENOUS
  Administered 2024-01-14 (×2): 25 ug via INTRAVENOUS
  Administered 2024-01-14: 50 ug via INTRAVENOUS

## 2024-01-14 MED ORDER — HYDROMORPHONE HCL 1 MG/ML IJ SOLN
INTRAMUSCULAR | Status: AC
Start: 2024-01-14 — End: 2024-01-14
  Filled 2024-01-14: qty 1

## 2024-01-14 MED ORDER — PHENYLEPHRINE 80 MCG/ML (10ML) SYRINGE FOR IV PUSH (FOR BLOOD PRESSURE SUPPORT)
PREFILLED_SYRINGE | INTRAVENOUS | Status: AC
  Filled 2024-01-14: qty 10

## 2024-01-14 MED ORDER — SODIUM CHLORIDE 0.9 % IV SOLN: INTRAVENOUS | Status: DC

## 2024-01-14 MED ORDER — ORAL CARE MOUTH RINSE: 15.0000 mL | Freq: Once | OROMUCOSAL | Status: AC

## 2024-01-14 MED ORDER — CHLORHEXIDINE GLUCONATE 0.12 % MT SOLN
OROMUCOSAL | Status: AC
  Administered 2024-01-14: 15 mL via OROMUCOSAL
  Filled 2024-01-14: qty 15

## 2024-01-14 MED ORDER — KETOROLAC TROMETHAMINE 30 MG/ML IJ SOLN
INTRAMUSCULAR | Status: AC
  Filled 2024-01-14: qty 1

## 2024-01-14 MED ORDER — MIDAZOLAM HCL 2 MG/2ML IJ SOLN
INTRAMUSCULAR | Status: AC
  Filled 2024-01-14: qty 2

## 2024-01-14 MED ORDER — LIDOCAINE 2% (20 MG/ML) 5 ML SYRINGE
INTRAMUSCULAR | Status: DC | PRN
  Administered 2024-01-14: 70 mg via INTRAVENOUS

## 2024-01-14 MED ORDER — DEXMEDETOMIDINE HCL IN NACL 80 MCG/20ML IV SOLN
INTRAVENOUS | Status: AC
  Filled 2024-01-14: qty 20

## 2024-01-14 MED ORDER — HYDROMORPHONE HCL 1 MG/ML IJ SOLN
0.5000 mg | INTRAMUSCULAR | Status: DC | PRN
  Administered 2024-01-14: 0.5 mg via INTRAVENOUS
  Filled 2024-01-14: qty 0.5

## 2024-01-14 MED ORDER — PHENYLEPHRINE 80 MCG/ML (10ML) SYRINGE FOR IV PUSH (FOR BLOOD PRESSURE SUPPORT)
PREFILLED_SYRINGE | INTRAVENOUS | Status: DC | PRN
  Administered 2024-01-14: 80 ug via INTRAVENOUS

## 2024-01-14 MED ORDER — VASHE WOUND IRRIGATION OPTIME
TOPICAL | Status: DC | PRN
  Administered 2024-01-14: 34 [oz_av]

## 2024-01-14 MED ORDER — FENTANYL CITRATE (PF) 250 MCG/5ML IJ SOLN
INTRAMUSCULAR | Status: AC
  Filled 2024-01-14: qty 5

## 2024-01-14 MED ORDER — LACTATED RINGERS IV SOLN: INTRAVENOUS | Status: DC

## 2024-01-14 MED ORDER — HYDROMORPHONE HCL 1 MG/ML IJ SOLN
0.2500 mg | INTRAMUSCULAR | Status: DC | PRN
  Administered 2024-01-14 (×4): 0.5 mg via INTRAVENOUS

## 2024-01-14 MED ORDER — LIDOCAINE 2% (20 MG/ML) 5 ML SYRINGE
INTRAMUSCULAR | Status: AC
Start: 2024-01-14 — End: 2024-01-14
  Filled 2024-01-14: qty 5

## 2024-01-14 MED ORDER — HYDROMORPHONE HCL 1 MG/ML IJ SOLN: 0.5000 mg | INTRAMUSCULAR | Status: DC | PRN

## 2024-01-14 MED ORDER — HYDROMORPHONE HCL-NACL 50-0.9 MG/50ML-% IV SOLN: 0.5000 mg/h | INTRAVENOUS | Status: DC | PRN

## 2024-01-14 MED ORDER — CHLORHEXIDINE GLUCONATE 0.12 % MT SOLN: 15.0000 mL | Freq: Once | OROMUCOSAL | Status: AC

## 2024-01-14 MED ORDER — DEXMEDETOMIDINE HCL IN NACL 80 MCG/20ML IV SOLN
INTRAVENOUS | Status: DC | PRN
Start: 2024-01-14 — End: 2024-01-14
  Administered 2024-01-14 (×2): 4 ug via INTRAVENOUS

## 2024-01-14 MED ORDER — PROPOFOL 10 MG/ML IV BOLUS
INTRAVENOUS | Status: AC
  Filled 2024-01-14: qty 20

## 2024-01-14 MED ORDER — MIDAZOLAM HCL 2 MG/2ML IJ SOLN
INTRAMUSCULAR | Status: DC | PRN
  Administered 2024-01-14: 2 mg via INTRAVENOUS

## 2024-01-14 MED ORDER — ONDANSETRON HCL 4 MG/2ML IJ SOLN
INTRAMUSCULAR | Status: AC
  Filled 2024-01-14: qty 2

## 2024-01-14 MED ORDER — HYDROMORPHONE HCL 1 MG/ML IJ SOLN: 0.2500 mg | INTRAMUSCULAR | Status: DC | PRN

## 2024-01-14 SURGICAL SUPPLY — 39 items
BAG COUNTER SPONGE SURGICOUNT (BAG) IMPLANT
BLADE SURG 21 STRL SS (BLADE) ×1 IMPLANT
BNDG COHESIVE 4X5 TAN STRL LF (GAUZE/BANDAGES/DRESSINGS) IMPLANT
BNDG COHESIVE 6X5 TAN NS LF (GAUZE/BANDAGES/DRESSINGS) IMPLANT
BNDG COHESIVE 6X5 TAN ST LF (GAUZE/BANDAGES/DRESSINGS) IMPLANT
BNDG GAUZE DERMACEA FLUFF 4 (GAUZE/BANDAGES/DRESSINGS) IMPLANT
CANISTER WOUNDNEG PRESSURE 500 (CANNISTER) IMPLANT
CLEANSER WND VASHE 34 (WOUND CARE) IMPLANT
CLEANSER WND VASHE INSTL 34OZ (WOUND CARE) IMPLANT
COVER SURGICAL LIGHT HANDLE (MISCELLANEOUS) ×2 IMPLANT
DRAPE DERMATAC (DRAPES) IMPLANT
DRAPE U-SHAPE 47X51 STRL (DRAPES) ×1 IMPLANT
DRESSING PREVENA PLUS CUSTOM (GAUZE/BANDAGES/DRESSINGS) IMPLANT
DRESSING VERAFLO CLEANS CC MED (GAUZE/BANDAGES/DRESSINGS) IMPLANT
DRSG ADAPTIC 3X8 NADH LF (GAUZE/BANDAGES/DRESSINGS) ×1 IMPLANT
DRSG VAC PEEL AND PLACE LRG (GAUZE/BANDAGES/DRESSINGS) IMPLANT
DURAPREP 26ML APPLICATOR (WOUND CARE) ×1 IMPLANT
ELECTRODE REM PT RTRN 9FT ADLT (ELECTROSURGICAL) IMPLANT
GAUZE PAD ABD 8X10 STRL (GAUZE/BANDAGES/DRESSINGS) IMPLANT
GAUZE SPONGE 4X4 12PLY STRL (GAUZE/BANDAGES/DRESSINGS) IMPLANT
GLOVE BIOGEL PI IND STRL 9 (GLOVE) ×1 IMPLANT
GLOVE SURG ORTHO 9.0 STRL STRW (GLOVE) ×1 IMPLANT
GOWN STRL REUS W/ TWL XL LVL3 (GOWN DISPOSABLE) ×2 IMPLANT
GRAFT SKIN WND SURGICLOSE M95 (Tissue) IMPLANT
KIT BASIN OR (CUSTOM PROCEDURE TRAY) ×1 IMPLANT
KIT TURNOVER KIT B (KITS) ×1 IMPLANT
MANIFOLD NEPTUNE II (INSTRUMENTS) ×1 IMPLANT
NS IRRIG 1000ML POUR BTL (IV SOLUTION) ×1 IMPLANT
PACK ORTHO EXTREMITY (CUSTOM PROCEDURE TRAY) ×1 IMPLANT
PAD ARMBOARD POSITIONER FOAM (MISCELLANEOUS) ×2 IMPLANT
PAD NEG PRESSURE SENSATRAC (MISCELLANEOUS) IMPLANT
SET HNDPC FAN SPRY TIP SCT (DISPOSABLE) IMPLANT
STOCKINETTE IMPERVIOUS 9X36 MD (GAUZE/BANDAGES/DRESSINGS) IMPLANT
SUT ETHILON 2 0 PSLX (SUTURE) ×1 IMPLANT
SWAB COLLECTION DEVICE MRSA (MISCELLANEOUS) ×1 IMPLANT
SWAB CULTURE ESWAB REG 1ML (MISCELLANEOUS) IMPLANT
TOWEL GREEN STERILE (TOWEL DISPOSABLE) ×1 IMPLANT
TUBE CONNECTING 12X1/4 (SUCTIONS) ×1 IMPLANT
YANKAUER SUCT BULB TIP NO VENT (SUCTIONS) ×1 IMPLANT

## 2024-01-14 NOTE — Anesthesia Preprocedure Evaluation (Addendum)
 Anesthesia Evaluation  Patient identified by MRN, date of birth, ID band Patient awake    Reviewed: Allergy & Precautions, NPO status , Patient's Chart, lab work & pertinent test results  Airway Mallampati: II  TM Distance: >3 FB Neck ROM: Full    Dental  (+) Dental Advisory Given, Edentulous Upper, Missing, Poor Dentition   Pulmonary COPD, Current Smoker and Patient abstained from smoking.   Pulmonary exam normal        Cardiovascular negative cardio ROS  Rhythm:Regular Rate:Normal     Neuro/Psych negative neurological ROS     GI/Hepatic ,GERD  ,,(+)     substance abuse  alcohol use, cocaine use and IV drug use  Endo/Other  negative endocrine ROS    Renal/GU negative Renal ROS     Musculoskeletal Lower leg nec fasc   Abdominal Normal abdominal exam  (+)   Peds  Hematology Lab Results      Component                Value               Date                      WBC                      43.7 (H)            01/08/2024                HGB                      10.1 (L)            01/08/2024                HCT                      28.2 (L)            01/08/2024                MCV                      71.0 (L)            01/08/2024                PLT                      244                 01/08/2024              Anesthesia Other Findings   Reproductive/Obstetrics                              Anesthesia Physical Anesthesia Plan  ASA: 3  Anesthesia Plan: General   Post-op Pain Management: Ofirmev  IV (intra-op)*   Induction: Intravenous  PONV Risk Score and Plan: 1 and Ondansetron , Dexamethasone , Midazolam  and Treatment may vary due to age or medical condition  Airway Management Planned: LMA  Additional Equipment: None  Intra-op Plan:   Post-operative Plan: Extubation in OR  Informed Consent: I have reviewed the patients History and Physical, chart, labs and discussed the  procedure including the risks, benefits and alternatives for the proposed anesthesia with the patient  or authorized representative who has indicated his/her understanding and acceptance.     Dental advisory given  Plan Discussed with: CRNA  Anesthesia Plan Comments:          Anesthesia Quick Evaluation

## 2024-01-14 NOTE — Op Note (Signed)
 01/14/2024  3:42 PM  PATIENT:  Robert Eaton    PRE-OPERATIVE DIAGNOSIS:  necrotizing fascitis right leg  POST-OPERATIVE DIAGNOSIS:  Same  PROCEDURE: Excisional DEBRIDEMENT RIGHT LEG WOUND, with excision skin soft tissue muscle and fascia.  Application of cleanse choice wound VAC sponges x 2. Application of peel in place wound VAC sponge. Application of Kerecis micro graft 95 cm to cover wound surface area greater than 200 cm.  SURGEON:  Jerona LULLA Sage, MD  PHYSICIAN ASSISTANT:None ANESTHESIA:   General  PREOPERATIVE INDICATIONS:  Robert Eaton is a  38 y.o. male with a diagnosis of necrotizing fascitis right leg who failed conservative measures and elected for surgical management.    The risks benefits and alternatives were discussed with the patient preoperatively including but not limited to the risks of infection, bleeding, nerve injury, cardiopulmonary complications, the need for revision surgery, among others, and the patient was willing to proceed.  OPERATIVE IMPLANTS:   Implant Name Type Inv. Item Serial No. Manufacturer Lot No. LRB No. Used Action  GRAFT SKIN WND SURGICLOSE M95 - [id number] Tissue GRAFT SKIN WND SURGICLOSE M95  [employer] [id number] Right 1 Implanted    @ENCIMAGES @  OPERATIVE FINDINGS: Patient's medial head of the soleus muscle was nonviable.  The muscle had poor color no contractility and easily tore.  OPERATIVE PROCEDURE: Patient was brought the operating room underwent a general anesthetic.  After adequate levels anesthesia were obtained patient's right lower extremity was prepped using DuraPrep draped into a sterile field a timeout was called.  Patient had ischemic changes of the entire posterior skin of the calf.  This was excised with 21 blade knife.  The medial head of the gastrocnemius muscle was viable.  Patient had nonviable changes to the medial solea's.  The muscle had poor color poor contractility and easily tore.  The nonviable muscle was  excised.  Electrocautery was used for hemostasis.  The remainder of the tissue margins had good petechial bleeding and the muscle remaining had good color and contractility.  The wound was irrigated with Vashe.  The wound bed was filled with 95 cm of Kerecis micro graft to cover wound surface area greater than 200 cm.  To cleanse choice sponges were applied this was covered with a peel in place dressing secured with derma tack and Coban.  This had a good suction fit patient was extubated taken the PACU in stable condition.   DISCHARGE PLANNING:  Antibiotic duration: Continue IV antibiotics  Weightbearing: Weightbearing as tolerated  Pain medication: Opioid pathway  Dressing care/ Wound VAC: Continue incisional VAC  Ambulatory devices: Walker or crutches  Discharge to: Anticipate returning to the operating room either Friday or next week.  Follow-up: In the office 1 week post operative.

## 2024-01-14 NOTE — Anesthesia Postprocedure Evaluation (Signed)
 Anesthesia Post Note  Patient: Robert Eaton  Procedure(s) Performed: IRRIGATION AND DEBRIDEMENT WOUND (Right: Leg Lower) APPLICATION, WOUND VAC (Right: Leg Lower)     Patient location during evaluation: PACU Anesthesia Type: General Level of consciousness: awake and alert Pain management: pain level controlled Vital Signs Assessment: post-procedure vital signs reviewed and stable Respiratory status: spontaneous breathing, nonlabored ventilation and respiratory function stable Cardiovascular status: blood pressure returned to baseline and stable Postop Assessment: no apparent nausea or vomiting Anesthetic complications: no   No notable events documented.  Last Vitals:  Vitals:   01/14/24 1800 01/14/24 1940  BP:  (!) 154/85  Pulse:  87  Resp: 18 15  Temp:  (!) 36.4 C  SpO2:  100%    Last Pain:  Vitals:   01/14/24 1940  TempSrc: Oral  PainSc:                  Jesus Nevills,W. EDMOND

## 2024-01-14 NOTE — Transfer of Care (Signed)
 Immediate Anesthesia Transfer of Care Note  Patient: Robert Eaton  Procedure(s) Performed: IRRIGATION AND DEBRIDEMENT WOUND (Right: Leg Lower) APPLICATION, WOUND VAC (Right: Leg Lower)  Patient Location: PACU  Anesthesia Type:General  Level of Consciousness: awake and alert   Airway & Oxygen Therapy: Patient Spontanous Breathing  Post-op Assessment: Report given to RN and Post -op Vital signs reviewed and stable  Post vital signs: Reviewed and stable  Last Vitals:  Vitals Value Taken Time  BP 130/106 01/14/24 15:45  Temp    Pulse 103 01/14/24 15:48  Resp 21 01/14/24 15:48  SpO2 100 % 01/14/24 15:48  Vitals shown include unfiled device data.  Last Pain:  Vitals:   01/14/24 1331  TempSrc:   PainSc: 9       Patients Stated Pain Goal: 4 (01/13/24 1919)  Complications: No notable events documented.

## 2024-01-14 NOTE — Plan of Care (Signed)
   Problem: Clinical Measurements: Goal: Ability to avoid or minimize complications of infection will improve Outcome: Progressing   Problem: Skin Integrity: Goal: Skin integrity will improve Outcome: Progressing   Problem: Education: Goal: Knowledge of General Education information will improve Description: Including pain rating scale, medication(s)/side effects and non-pharmacologic comfort measures Outcome: Progressing   Problem: Health Behavior/Discharge Planning: Goal: Ability to manage health-related needs will improve Outcome: Progressing   Problem: Clinical Measurements: Goal: Ability to maintain clinical measurements within normal limits will improve Outcome: Progressing Goal: Will remain free from infection Outcome: Progressing Goal: Diagnostic test results will improve Outcome: Progressing Goal: Respiratory complications will improve Outcome: Progressing Goal: Cardiovascular complication will be avoided Outcome: Progressing   Problem: Activity: Goal: Risk for activity intolerance will decrease Outcome: Progressing   Problem: Nutrition: Goal: Adequate nutrition will be maintained Outcome: Progressing   Problem: Coping: Goal: Level of anxiety will decrease Outcome: Progressing   Problem: Elimination: Goal: Will not experience complications related to bowel motility Outcome: Progressing Goal: Will not experience complications related to urinary retention Outcome: Progressing   Problem: Pain Managment: Goal: General experience of comfort will improve and/or be controlled Outcome: Progressing   Problem: Safety: Goal: Ability to remain free from injury will improve Outcome: Progressing   Problem: Skin Integrity: Goal: Risk for impaired skin integrity will decrease Outcome: Progressing

## 2024-01-14 NOTE — Interval H&P Note (Signed)
 History and Physical Interval Note:  01/14/2024 6:52 AM  Robert Eaton  has presented today for surgery, with the diagnosis of necrotizing fascitis right leg.  The various methods of treatment have been discussed with the patient and family. After consideration of risks, benefits and other options for treatment, the patient has consented to  Procedure(s) with comments: IRRIGATION AND DEBRIDEMENT WOUND (Right) - RIGHT LEG DEBRIDEMENT as a surgical intervention.  The patient's history has been reviewed, patient examined, no change in status, stable for surgery.  I have reviewed the patient's chart and labs.  Questions were answered to the patient's satisfaction.     Kashina Mecum V Yuki Purves

## 2024-01-14 NOTE — Progress Notes (Signed)
 HD#7 SUBJECTIVE:  Patient Summary: Robert Eaton is a 38 y.o. with a pertinent PMH of COPD, polysubstance abuse, abuse disorder on Suboxone , alcohol use disorder without known withdrawal, and major depressive disorder, who presented with right lower extremity edema and jaundice and admitted for sepsis 2/2 right lower extermity cellulitis with hyperbilirubinemia and severe leukocytosis.   Overnight Events: none  Interim History: Patient reports feeling the same as he has been. No difficulty with BM or urination. His wound vacuum is still functioning appropriately and has an output of this morning.  He states his pain level is increasing, and was given Oxycodone  5mg  instead of his 10mg . Will add breakthrough pain medication.   OBJECTIVE:  Vital Signs: Vitals:   01/13/24 2035 01/14/24 0452 01/14/24 0457 01/14/24 0748  BP: 110/73 110/67  104/65  Pulse: 94 92  85  Resp: 18 18    Temp: 98.9 F (37.2 C) 98.2 F (36.8 C)  98.1 F (36.7 C)  TempSrc: Oral Oral  Oral  SpO2: 100% 96%  96%  Weight:   84.7 kg   Height:       Supplemental O2: Room Air SpO2: 96 % O2 Flow Rate (L/min): 2 L/min  Filed Weights   01/10/24 0303 01/13/24 0622 01/14/24 0457  Weight: 79.2 kg 87.2 kg 84.7 kg     Intake/Output Summary (Last 24 hours) at 01/14/2024 1021 Last data filed at 01/14/2024 0455 Gross per 24 hour  Intake --  Output 5050 ml  Net -5050 ml   Net IO Since Admission: -12,520.56 mL [01/14/24 1021]  Physical Exam: Constitutional: Well-appearing adult male. In no acute distress. Pulm:Normal work of breathing on room air. MSK: Right lower extremity wrapped in gauze and Ace wrap from the foot to the knee.  No ascending erythema or warmth.  Patient Lines/Drains/Airways Status     Active Line/Drains/Airways     Name Placement date Placement time Site Days   Peripheral IV 01/07/24 20 G Posterior;Right Hand 01/07/24  1254  Hand  1   Peripheral IV 01/07/24 18 G Anterior;Distal;Left;Upper  Arm 01/07/24  1317  Arm  1            Pertinent labs and imaging:      Latest Ref Rng & Units 01/14/2024    5:23 AM 01/13/2024    6:23 AM 01/12/2024    3:39 AM  CBC  WBC 4.0 - 10.5 K/uL 7.9  8.6  14.4   Hemoglobin 13.0 - 17.0 g/dL 8.7  7.8  8.0   Hematocrit 39.0 - 52.0 % 26.6  23.5  23.0   Platelets 150 - 400 K/uL 551  473  464        Latest Ref Rng & Units 01/14/2024    5:23 AM 01/13/2024    6:23 AM 01/12/2024    3:39 AM  CMP  Glucose 70 - 99 mg/dL 891  865  897   BUN 6 - 20 mg/dL <5  <5  <5   Creatinine 0.61 - 1.24 mg/dL 9.10  9.27  9.22   Sodium 135 - 145 mmol/L 133  132  134   Potassium 3.5 - 5.1 mmol/L 4.2  3.8  4.1   Chloride 98 - 111 mmol/L 103  102  104   CO2 22 - 32 mmol/L 23  24  24    Calcium 8.9 - 10.3 mg/dL 8.1  7.7  7.8   Total Protein 6.5 - 8.1 g/dL 7.2  6.5  5.8   Total Bilirubin 0.0 -  1.2 mg/dL 0.8  0.8  0.8   Alkaline Phos 38 - 126 U/L 122  123  130   AST 15 - 41 U/L 50  42  46   ALT 0 - 44 U/L 47  46  54     No results found.    ASSESSMENT/PLAN:  Assessment: Principal Problem:   Fasciitis Active Problems:   Polysubstance abuse (HCC)   Alcohol use disorder   Opioid use disorder   Cellulitis of right lower extremity   Sepsis (HCC)   Jaundice   Hyperbilirubinemia   Hyponatremia   Hepatic steatosis   Severe protein-calorie malnutrition (HCC)   Necrotizing fasciitis due to Streptococcus pyogenes (HCC)  Robert Eaton is a 38 y.o. with a pertinent PMH of COPD, polysubstance abuse, abuse disorder on Suboxone , alcohol use disorder without known withdrawal, and major depressive disorder, who presented with right lower extremity edema and jaundice and admitted for sepsis 2/2 right lower extermity cellulitis with hyperbilirubinemia and severe leukocytosis.   Plan: #Sepsis d/t right lower extremity SSTI with abscess and fasciitis Stable symptoms with right lower extremity pain following I&D on 01/08/2024.  Intraoperative cultures show streptococcus  pyogenes. Currently on day 8 of antibiotics, tolerating well.  White count improved to 7.9 today with no overnight fever. On IV Unasyn  and Linezolid . Wound vacuum functioning appropriately. Receiving another debridement surgery today, potentially again on Friday. Will pend orthopedics recommendations. - Continue IV Linezolid  and Unasyn  - I&D today -- Will advance diet after surgery as tolerated - Oxycodone  to 10 mg every 4 hours as needed, likely will need pain adjustment for surgery today - Continue Suboxone  8-2 mg 3 times daily  Sepsis induced cholestasis Bilirubin down to 0.8 today which is a significant improvement.  AST and ALT stable at 50/47 and alk phos 122.  Received Hep A vaccine yesterday. -Continue daily CMP  Hyponatremia Continues to improve with sodium 133 today.  Iliac and inguinal adenopathy Seen on admission CT and noted to be concerning for malignancy. Further clinical information regarding RLE extensive infection provided to radiology and with these findings they suggest reactive LAD. Recommend follow-up CT abdomen and pelvis to ensure no malignant cause after acute infection resolves during this admission.   Microcytic anemia Microcytosis around 71 with development of anemia after hemodilution and surgery with hemoglobin down to 8.0 today.  Platelets elevated at 551.  Previous smears have shown target cells and Dohle bodies.  Iron studies consistent with either iron deficiency or anemia of chronic disease with low iron and saturation but also low TIBC and high ferritin in the setting of acute infection.  No signs of bleeding other than from wound with appropriate drainage from the wound VAC.  Opiate Use Disorder Continue prior suboxone  dose 8-2mg  TID.  COPD Continue albuterol  PRN. No signs of respiratory distress or COPD exacerbation.   Best Practice: Diet: Regular diet IVF: None VTE: enoxaparin  (LOVENOX ) injection 40 mg Start: 01/09/24 1000 SCDs Start: 01/09/24  0716 Code: Full  Disposition planning: Therapy Recs: None, DME: none Family Contact: dad DISPO: Anticipated discharge planning pending return to the OR next week  Signature:  Karla [facility]  10:21 AM, 01/14/2024  On Call pager [phone]

## 2024-01-14 NOTE — Progress Notes (Signed)
 Called for report on patient. No answer. Msged RN and gave called back number. Transport sent for patient.

## 2024-01-15 ENCOUNTER — Encounter (HOSPITAL_COMMUNITY): Payer: Self-pay | Admitting: Orthopedic Surgery

## 2024-01-15 LAB — CBC
HCT: 24.6 % — ABNORMAL LOW (ref 39.0–52.0)
Hemoglobin: 7.8 g/dL — ABNORMAL LOW (ref 13.0–17.0)
MCH: 24.7 pg — ABNORMAL LOW (ref 26.0–34.0)
MCHC: 31.7 g/dL (ref 30.0–36.0)
MCV: 77.8 fL — ABNORMAL LOW (ref 80.0–100.0)
Platelets: 613 K/uL — ABNORMAL HIGH (ref 150–400)
RBC: 3.16 MIL/uL — ABNORMAL LOW (ref 4.22–5.81)
RDW: 17.5 % — ABNORMAL HIGH (ref 11.5–15.5)
WBC: 9.8 K/uL (ref 4.0–10.5)
nRBC: 0 % (ref 0.0–0.2)

## 2024-01-15 LAB — COMPREHENSIVE METABOLIC PANEL WITH GFR
ALT: 35 U/L (ref 0–44)
AST: 33 U/L (ref 15–41)
Albumin: 1.8 g/dL — ABNORMAL LOW (ref 3.5–5.0)
Alkaline Phosphatase: 104 U/L (ref 38–126)
Anion gap: 6 (ref 5–15)
BUN: 6 mg/dL (ref 6–20)
CO2: 24 mmol/L (ref 22–32)
Calcium: 7.9 mg/dL — ABNORMAL LOW (ref 8.9–10.3)
Chloride: 101 mmol/L (ref 98–111)
Creatinine, Ser: 0.89 mg/dL (ref 0.61–1.24)
GFR, Estimated: >60 mL/min (ref >60)
Glucose, Bld: 113 mg/dL — ABNORMAL HIGH (ref 70–99)
Potassium: 4.6 mmol/L (ref 3.5–5.1)
Sodium: 131 mmol/L — ABNORMAL LOW (ref 135–145)
Total Bilirubin: 0.8 mg/dL (ref 0.0–1.2)
Total Protein: 7 g/dL (ref 6.5–8.1)

## 2024-01-15 MED ORDER — HYDROMORPHONE HCL 1 MG/ML IJ SOLN
0.5000 mg | INTRAMUSCULAR | Status: DC | PRN
  Administered 2024-01-15 – 2024-01-20 (×18): 0.5 mg via INTRAVENOUS
  Administered 2024-01-21: .5 mg via INTRAVENOUS
  Administered 2024-01-21 (×5): 0.5 mg via INTRAVENOUS
  Administered 2024-01-21: .5 mg via INTRAVENOUS
  Administered 2024-01-21 (×2): 0.5 mg via INTRAVENOUS
  Administered 2024-01-21 (×2): .5 mg via INTRAVENOUS
  Administered 2024-01-21 – 2024-01-23 (×12): 0.5 mg via INTRAVENOUS
  Filled 2024-01-15 (×31): qty 0.5

## 2024-01-15 MED ORDER — HYDROMORPHONE HCL 2 MG PO TABS
4.0000 mg | ORAL_TABLET | ORAL | Status: DC | PRN
  Administered 2024-01-15 – 2024-01-16 (×4): 4 mg via ORAL
  Filled 2024-01-15 (×4): qty 2

## 2024-01-15 MED ORDER — LINEZOLID 600 MG/300ML IV SOLN
600.0000 mg | Freq: Two times a day (BID) | INTRAVENOUS | Status: DC
  Administered 2024-01-15: 600 mg via INTRAVENOUS
  Filled 2024-01-15 (×2): qty 300

## 2024-01-15 NOTE — Plan of Care (Signed)
   Problem: Clinical Measurements: Goal: Ability to avoid or minimize complications of infection will improve Outcome: Progressing   Problem: Skin Integrity: Goal: Skin integrity will improve Outcome: Progressing   Problem: Education: Goal: Knowledge of General Education information will improve Description: Including pain rating scale, medication(s)/side effects and non-pharmacologic comfort measures Outcome: Progressing   Problem: Health Behavior/Discharge Planning: Goal: Ability to manage health-related needs will improve Outcome: Progressing   Problem: Clinical Measurements: Goal: Ability to maintain clinical measurements within normal limits will improve Outcome: Progressing Goal: Will remain free from infection Outcome: Progressing Goal: Diagnostic test results will improve Outcome: Progressing Goal: Respiratory complications will improve Outcome: Progressing Goal: Cardiovascular complication will be avoided Outcome: Progressing   Problem: Activity: Goal: Risk for activity intolerance will decrease Outcome: Progressing   Problem: Nutrition: Goal: Adequate nutrition will be maintained Outcome: Progressing   Problem: Coping: Goal: Level of anxiety will decrease Outcome: Progressing   Problem: Elimination: Goal: Will not experience complications related to bowel motility Outcome: Progressing Goal: Will not experience complications related to urinary retention Outcome: Progressing   Problem: Pain Managment: Goal: General experience of comfort will improve and/or be controlled Outcome: Progressing   Problem: Safety: Goal: Ability to remain free from injury will improve Outcome: Progressing   Problem: Skin Integrity: Goal: Risk for impaired skin integrity will decrease Outcome: Progressing
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
## 2024-01-15 NOTE — Progress Notes (Addendum)
 HD#8 SUBJECTIVE:  Patient Summary: Robert Eaton is a 38 y.o. with a pertinent PMH of COPD, polysubstance abuse, abuse disorder on Suboxone , alcohol use disorder without known withdrawal, and major depressive disorder, who presented with right lower extremity edema and jaundice and admitted for sepsis 2/2 right lower extermity cellulitis with hyperbilirubinemia and severe leukocytosis.   Overnight Events: Complained of breakthrough pain, required dilaudid  0.5mg  injection   Interim History: Patient reports his pain not well controlled. He states he had been given Oxycodone  10mg  this morning, but felt it was not doing enough for his pain as he had surgery yesterday. States his overnight dilaudid  did help, and was able to get some rest. Denies any other symptoms. Has been able to urinate well, has not had a BM but reports gas and a sustained appetite.   OBJECTIVE:  Vital Signs: Vitals:   01/14/24 1800 01/14/24 1940 01/15/24 0427 01/15/24 0754  BP:  (!) 154/85 111/66 108/65  Pulse:  87 100 97  Resp: 18 15 15    Temp:  (!) 97.5 F (36.4 C) 99.7 F (37.6 C) 98.6 F (37 C)  TempSrc:  Oral Oral Oral  SpO2:  100% 97% 97%  Weight:      Height:       Supplemental O2: Room Air SpO2: 97 % O2 Flow Rate (L/min): 2 L/min  Filed Weights   01/13/24 0622 01/14/24 0457 01/14/24 1319  Weight: 87.2 kg 84.7 kg 84.6 kg     Intake/Output Summary (Last 24 hours) at 01/15/2024 1033 Last data filed at 01/15/2024 0811 Gross per 24 hour  Intake 4778.58 ml  Output 2050 ml  Net 2728.58 ml   Net IO Since Admission: -9,891.98 mL [01/15/24 1033]  Physical Exam: Constitutional: Well-appearing adult male. In no acute distress. Pulm:Normal work of breathing on room air. MSK: Right lower extremity wrapped in gauze and Ace wrap from the foot to the knee.  No ascending erythema or warmth.  Patient Lines/Drains/Airways Status     Active Line/Drains/Airways     Name Placement date Placement time Site Days    Peripheral IV 01/07/24 20 G Posterior;Right Hand 01/07/24  1254  Hand  1   Peripheral IV 01/07/24 18 G Anterior;Distal;Left;Upper Arm 01/07/24  1317  Arm  1            Pertinent labs and imaging:      Latest Ref Rng & Units 01/15/2024    6:41 AM 01/14/2024    5:23 AM 01/13/2024    6:23 AM  CBC  WBC 4.0 - 10.5 K/uL 9.8  7.9  8.6   Hemoglobin 13.0 - 17.0 g/dL 7.8  8.7  7.8   Hematocrit 39.0 - 52.0 % 24.6  26.6  23.5   Platelets 150 - 400 K/uL 613  551  473        Latest Ref Rng & Units 01/15/2024    6:41 AM 01/14/2024    5:23 AM 01/13/2024    6:23 AM  CMP  Glucose 70 - 99 mg/dL 886  891  865   BUN 6 - 20 mg/dL 6  <5  <5   Creatinine 0.61 - 1.24 mg/dL 9.10  9.10  9.27   Sodium 135 - 145 mmol/L 131  133  132   Potassium 3.5 - 5.1 mmol/L 4.6  4.2  3.8   Chloride 98 - 111 mmol/L 101  103  102   CO2 22 - 32 mmol/L 24  23  24    Calcium 8.9 -  10.3 mg/dL 7.9  8.1  7.7   Total Protein 6.5 - 8.1 g/dL 7.0  7.2  6.5   Total Bilirubin 0.0 - 1.2 mg/dL 0.8  0.8  0.8   Alkaline Phos 38 - 126 U/L 104  122  123   AST 15 - 41 U/L 33  50  42   ALT 0 - 44 U/L 35  47  46     No results found.    ASSESSMENT/PLAN:  Assessment: Principal Problem:   Fasciitis Active Problems:   Polysubstance abuse (HCC)   Alcohol use disorder   Opioid use disorder   Cellulitis of right lower extremity   Sepsis (HCC)   Jaundice   Hyperbilirubinemia   Hyponatremia   Hepatic steatosis   Severe protein-calorie malnutrition (HCC)   Necrotizing fasciitis due to Streptococcus pyogenes (HCC)   Hypokalemia  Robert Eaton is a 38 y.o. with a pertinent PMH of COPD, polysubstance abuse, abuse disorder on Suboxone , alcohol use disorder without known withdrawal, and major depressive disorder, who presented with right lower extremity edema and jaundice and admitted for sepsis 2/2 right lower extermity cellulitis with hyperbilirubinemia and severe leukocytosis.   Plan: #Sepsis d/t right lower extremity SSTI with  abscess and fasciitis Stable symptoms with right lower extremity pain following I&D on 01/08/2024.  Intraoperative cultures show streptococcus pyogenes. Currently on day 9 of antibiotics, tolerating well.  White count 9.8 today with no overnight fever. On IV Unasyn , was placed back on Linezolid  as the medication had previously been stopped. Wound vacuum functioning appropriately with ~350cc of bloody fluid, will monitor coagulation panel. 8/6 surgery showed ischemic changes to the medial head of the soleus of the right lower extremity, with the muscle having poor contractility and easily tore during the operation. It was excised. Orthopedics recommending continued IV antibiotics and to return to the operating room next Wednesday. Will hold VTE prophylaxis for 1 week per Orthopedics. His pain is not well-managed on the Oxycodone  10mg  despite IV Dilaudid  for breakthrough pain. Will switch Oxycodone  to Dilaudid  4mg  PO to manage pain better. If this does not improve, will increase the dose of Oxycodone  to 15mg . - Continue IV Linezolid  and Unasyn  -- Ambulation  -- PTT and PT/INR -- Hold VTE prophylaxis - Dilaudid  4mg  PO every 4 hours as needed - Continue Suboxone  8-2 mg 3 times daily  Sepsis induced cholestasis Bilirubin 0.8 today. AST and ALT significantly improved at 33/35 and alk phos 104. -Continue daily CMP  Hyponatremia Sodium 131 today, likely in the setting of dilution from surgery yesterday. Will monitor to ensure no new changes - Trend CMP  Iliac and inguinal adenopathy Seen on admission CT and noted to be concerning for malignancy. Further clinical information regarding RLE extensive infection provided to radiology and with these findings they suggest reactive LAD. Recommend follow-up CT abdomen and pelvis to ensure no malignant cause after acute infection resolves during this admission.   Microcytic anemia Microcytosis around 71 with development of anemia after hemodilution and surgery  with hemoglobin down to 8.0 today.  Platelets elevated at 613.  Previous smears have shown target cells and Dohle bodies.  Iron studies consistent with either iron deficiency or anemia of chronic disease with low iron and saturation but also low TIBC and high ferritin in the setting of acute infection.  No signs of bleeding other than from wound with appropriate drainage from the wound VAC.  Opiate Use Disorder Continue prior suboxone  dose 8-2mg  TID.  COPD Continue albuterol  PRN. No  signs of respiratory distress or COPD exacerbation.   Best Practice: Diet: Regular diet IVF: None VTE: SCDs Start: 01/14/24 1646 SCDs Start: 01/09/24 0716 Code: Full  Disposition planning: Therapy Recs: None, DME: none Family Contact: dad DISPO: Anticipated discharge planning pending return to the OR next week  Signature:  Abdiel [facility]  10:33 AM, 01/15/2024  On Call pager [phone]

## 2024-01-15 NOTE — Progress Notes (Signed)
 Mobility Specialist Progress Note:    01/15/24 1612  Mobility  Activity Ambulated with assistance (In room/ to BR)  Level of Assistance Standby assist, set-up cues, supervision of patient - no hands on  Assistive Device Front wheel walker  Distance Ambulated (ft) 30 ft  RLE Weight Bearing Per Provider Order WBAT  Activity Response Tolerated well  Mobility Referral Yes  Mobility visit 1 Mobility  Mobility Specialist Start Time (ACUTE ONLY) 1548  Mobility Specialist Stop Time (ACUTE ONLY) 1609  Mobility Specialist Time Calculation (min) (ACUTE ONLY) 21 min   Received pt in bed and agreeable to mobility. Pt requested to use the BR. Once finished, ambulated around the room two times. C/o RLE pain, otherwise tolerated well. Returned pt to bed with personal belongings and call light within reach. All needs met. Officer present.  Lavanda Pollack Mobility Specialist  Please contact via [employer] or  Rehab Office [phone]

## 2024-01-15 NOTE — Progress Notes (Signed)
 Patient ID: Robert Eaton, male   DOB: May 09, 1986, 38 y.o.   MRN: [id number] Patient is a 38 year old gentleman who is postop day 1 repeat debridement necrotizing fasciitis right leg.  Patient had nonviable soleus muscle medially and this was resected.  The medial gastrocnemius muscle had good color and contractility.  There is 350 cc in the wound VAC canister with a good suction fit.  Plan to return to the operating room on Wednesday.

## 2024-01-16 LAB — PROTIME-INR
INR: 1.2 (ref 0.8–1.2)
Prothrombin Time: 16.2 s — ABNORMAL HIGH (ref 11.4–15.2)

## 2024-01-16 LAB — COMPREHENSIVE METABOLIC PANEL WITH GFR
ALT: 28 U/L (ref 0–44)
AST: 25 U/L (ref 15–41)
Albumin: 1.8 g/dL — ABNORMAL LOW (ref 3.5–5.0)
Alkaline Phosphatase: 92 U/L (ref 38–126)
Anion gap: 8 (ref 5–15)
BUN: 6 mg/dL (ref 6–20)
CO2: 27 mmol/L (ref 22–32)
Calcium: 8.3 mg/dL — ABNORMAL LOW (ref 8.9–10.3)
Chloride: 103 mmol/L (ref 98–111)
Creatinine, Ser: 0.86 mg/dL (ref 0.61–1.24)
GFR, Estimated: >60 mL/min (ref >60)
Glucose, Bld: 106 mg/dL — ABNORMAL HIGH (ref 70–99)
Potassium: 4.6 mmol/L (ref 3.5–5.1)
Sodium: 138 mmol/L (ref 135–145)
Total Bilirubin: 0.7 mg/dL (ref 0.0–1.2)
Total Protein: 6.7 g/dL (ref 6.5–8.1)

## 2024-01-16 LAB — CBC
HCT: 23.4 % — ABNORMAL LOW (ref 39.0–52.0)
Hemoglobin: 7.6 g/dL — ABNORMAL LOW (ref 13.0–17.0)
MCH: 25.6 pg — ABNORMAL LOW (ref 26.0–34.0)
MCHC: 32.5 g/dL (ref 30.0–36.0)
MCV: 78.8 fL — ABNORMAL LOW (ref 80.0–100.0)
Platelets: 619 K/uL — ABNORMAL HIGH (ref 150–400)
RBC: 2.97 MIL/uL — ABNORMAL LOW (ref 4.22–5.81)
RDW: 18 % — ABNORMAL HIGH (ref 11.5–15.5)
WBC: 9.3 K/uL (ref 4.0–10.5)
nRBC: 0 % (ref 0.0–0.2)

## 2024-01-16 LAB — APTT: aPTT: 38 s — ABNORMAL HIGH (ref 24–36)

## 2024-01-16 MED ORDER — OXYCODONE HCL 5 MG PO TABS
15.0000 mg | ORAL_TABLET | ORAL | Status: DC | PRN
  Administered 2024-01-16 – 2024-01-17 (×5): 15 mg via ORAL
  Filled 2024-01-16 (×5): qty 3

## 2024-01-16 NOTE — Progress Notes (Signed)
 HD#9 SUBJECTIVE:  Patient Summary: Robert Eaton is a 38 y.o. with a pertinent PMH of COPD, polysubstance abuse, abuse disorder on Suboxone , alcohol use disorder without known withdrawal, and major depressive disorder, who presented with right lower extremity edema and jaundice and admitted for sepsis 2/2 right lower extermity cellulitis with hyperbilirubinemia and severe leukocytosis.   Overnight Events: None  Interim History: Patient reports his pain is still not very well controlled. He states he has been given Tylenol  and got some PRN Dilaudid  last night. He will closely monitor his pain today. Otherwise has had a BM yesterday and urinating well. His wound VAC has good output.    OBJECTIVE:  Vital Signs: Vitals:   01/15/24 0427 01/15/24 0754 01/15/24 1918 01/16/24 0409  BP: 111/66 108/65 118/68 115/76  Pulse: 100 97 92 77  Resp: 15  15 17   Temp: 99.7 F (37.6 C) 98.6 F (37 C) 98.1 F (36.7 C) 97.7 F (36.5 C)  TempSrc: Oral Oral Oral Oral  SpO2: 97% 97% 98% 97%  Weight:      Height:       Supplemental O2: Room Air SpO2: 97 % O2 Flow Rate (L/min): 2 L/min  Filed Weights   01/13/24 0622 01/14/24 0457 01/14/24 1319  Weight: 87.2 kg 84.7 kg 84.6 kg     Intake/Output Summary (Last 24 hours) at 01/16/2024 1152 Last data filed at 01/16/2024 1022 Gross per 24 hour  Intake 240 ml  Output 5225 ml  Net -4985 ml   Net IO Since Admission: -14,876.98 mL [01/16/24 1152]  Physical Exam: Constitutional: Well-appearing adult male. In no acute distress. Pulm:Normal work of breathing on room air. MSK: Right lower extremity wrapped in gauze and Ace wrap from the foot to the knee.  No ascending erythema or warmth.  Patient Lines/Drains/Airways Status     Active Line/Drains/Airways     Name Placement date Placement time Site Days   Peripheral IV 01/07/24 20 G Posterior;Right Hand 01/07/24  1254  Hand  1   Peripheral IV 01/07/24 18 G Anterior;Distal;Left;Upper Arm 01/07/24  1317   Arm  1            Pertinent labs and imaging:      Latest Ref Rng & Units 01/16/2024    4:37 AM 01/15/2024    6:41 AM 01/14/2024    5:23 AM  CBC  WBC 4.0 - 10.5 K/uL 9.3  9.8  7.9   Hemoglobin 13.0 - 17.0 g/dL 7.6  7.8  8.7   Hematocrit 39.0 - 52.0 % 23.4  24.6  26.6   Platelets 150 - 400 K/uL 619  613  551        Latest Ref Rng & Units 01/16/2024    4:37 AM 01/15/2024    6:41 AM 01/14/2024    5:23 AM  CMP  Glucose 70 - 99 mg/dL 893  886  891   BUN 6 - 20 mg/dL 6  6  <5   Creatinine 9.38 - 1.24 mg/dL 9.13  9.10  9.10   Sodium 135 - 145 mmol/L 138  131  133   Potassium 3.5 - 5.1 mmol/L 4.6  4.6  4.2   Chloride 98 - 111 mmol/L 103  101  103   CO2 22 - 32 mmol/L 27  24  23    Calcium 8.9 - 10.3 mg/dL 8.3  7.9  8.1   Total Protein 6.5 - 8.1 g/dL 6.7  7.0  7.2   Total Bilirubin 0.0 - 1.2  mg/dL 0.7  0.8  0.8   Alkaline Phos 38 - 126 U/L 92  104  122   AST 15 - 41 U/L 25  33  50   ALT 0 - 44 U/L 28  35  47     No results found.    ASSESSMENT/PLAN:  Assessment: Principal Problem:   Fasciitis Active Problems:   Polysubstance abuse (HCC)   Alcohol use disorder   Opioid use disorder   Cellulitis of right lower extremity   Sepsis (HCC)   Jaundice   Hyperbilirubinemia   Hyponatremia   Hepatic steatosis   Severe protein-calorie malnutrition (HCC)   Necrotizing fasciitis due to Streptococcus pyogenes (HCC)   Hypokalemia  Robert Eaton is a 38 y.o. with a pertinent PMH of COPD, polysubstance abuse, abuse disorder on Suboxone , alcohol use disorder without known withdrawal, and major depressive disorder, who presented with right lower extremity edema and jaundice and admitted for sepsis 2/2 right lower extermity cellulitis with hyperbilirubinemia and severe leukocytosis.   Plan: #Sepsis d/t right lower extremity SSTI with abscess and fasciitis Stable symptoms with right lower extremity pain following I&D on 01/08/2024 and 01/14/24.  Intraoperative cultures show streptococcus  pyogenes. Currently on day 10 of antibiotics, tolerating well.  White count 9.3 today with no overnight fever.  PT elevated at 16.2, PTT elevated at 38.  Will monitor.  On IV Unasyn , IV Linezolid  stopped yesterday. Wound vacuum functioning appropriately with ~150cc of bloody fluid, will monitor coagulation panel. Orthopedics recommending continued IV antibiotics and to return to the operating room next Wednesday. Will hold VTE prophylaxis for 1 week per Orthopedics. His pain is not well-managed on the Dilaudid  4mg  with IV Dilaudid  for breakthrough pain. We discontinued his Suboxone  for the meantime, as this may have been limiting the ceiling threshold for his pain medications making them less effective.  If his pain does not improve after holding Suboxone , will consider oxycodone  15 mg. - Continue IV Unasyn  -- Ambulation  -- PTT and PT/INR - Trend CBC -- Hold VTE prophylaxis - Dilaudid  4mg  PO every 4 hours as needed - Continue Suboxone  8-2 mg 3 times daily  Sepsis induced cholestasis, resolved Bilirubin 0.7 today. AST and ALT significantly improved at 25/28 and alk phos 92.  Will stop monitoring as this has resolved.  Hyponatremia, resolved Sodium 138 today, improving without intervention.  Will stop monitoring as this is resolved.  Iliac and inguinal adenopathy Seen on admission CT and noted to be concerning for malignancy. Further clinical information regarding RLE extensive infection provided to radiology and with these findings they suggest reactive LAD. Recommend follow-up CT abdomen and pelvis to ensure no malignant cause after acute infection resolves during this admission.   Microcytic anemia Microcytosis around 71 with development of anemia after hemodilution and surgery with hemoglobin down to 7.6 today.  Platelets elevated at 619.  Previous smears have shown target cells and Dohle bodies.  Iron studies consistent with either iron deficiency or anemia of chronic disease with low iron and  saturation but also low TIBC and high ferritin in the setting of acute infection.  No signs of bleeding other than from wound with appropriate drainage from the wound VAC. - Trend CBC  Opiate Use Disorder Holding suboxone  dose 8-2mg  TID in the setting of postoperative pain and managing appropriate pain coverage while inpatient.  COPD Continue albuterol  PRN. No signs of respiratory distress or COPD exacerbation.   Best Practice: Diet: Regular diet IVF: None VTE: SCDs Start: 01/14/24 1646 SCDs  Start: 01/09/24 0716 Code: Full  Disposition planning: Therapy Recs: None, DME: none Family Contact: dad DISPO: Anticipated discharge planning pending return to the OR next week  Signature:  Gaylord Seydel [facility]  11:52 AM, 01/16/2024  On Call pager [phone]

## 2024-01-16 NOTE — TOC Progression Note (Signed)
 Transition of Care [facility]) - Progression Note    Patient Details  Name: Robert Eaton MRN: [id number] Date of Birth: 1985/07/19  [facility] Care [facility]) CM/SW Contact  Lauraine FORBES Saa, LCSWA Phone Number: 01/16/2024, 11:54 AM  Clinical Narrative:     11:54 AM Per chart review, patient is expected to discharge next week pending return to OR. TOC will continue to follow and be available to assist.  Expected Discharge Plan:  [facility]) Barriers to Discharge: Continued Medical Work up               Expected Discharge Plan and Services       Living arrangements for the past 2 months:  ([facility])                                       Social Drivers of Health (SDOH) Interventions SDOH Screenings   Food Insecurity: Patient Declined (01/15/2024)  Housing: High Risk (01/15/2024)  Transportation Needs: No Transportation Needs (01/15/2024)  Utilities: Not At Risk (01/08/2024)  Alcohol Screen: Low Risk  (09/18/2023)  Depression (PHQ2-9): Medium Risk (09/18/2023)  Tobacco Use: High Risk (01/14/2024)    Readmission Risk Interventions     No data to display

## 2024-01-16 NOTE — Plan of Care (Signed)
   Problem: Clinical Measurements: Goal: Ability to avoid or minimize complications of infection will improve Outcome: Progressing   Problem: Skin Integrity: Goal: Skin integrity will improve Outcome: Progressing   Problem: Education: Goal: Knowledge of General Education information will improve Description: Including pain rating scale, medication(s)/side effects and non-pharmacologic comfort measures Outcome: Progressing   Problem: Health Behavior/Discharge Planning: Goal: Ability to manage health-related needs will improve Outcome: Progressing   Problem: Clinical Measurements: Goal: Ability to maintain clinical measurements within normal limits will improve Outcome: Progressing Goal: Will remain free from infection Outcome: Progressing Goal: Diagnostic test results will improve Outcome: Progressing Goal: Respiratory complications will improve Outcome: Progressing Goal: Cardiovascular complication will be avoided Outcome: Progressing   Problem: Activity: Goal: Risk for activity intolerance will decrease Outcome: Progressing   Problem: Nutrition: Goal: Adequate nutrition will be maintained Outcome: Progressing   Problem: Coping: Goal: Level of anxiety will decrease Outcome: Progressing   Problem: Elimination: Goal: Will not experience complications related to bowel motility Outcome: Progressing Goal: Will not experience complications related to urinary retention Outcome: Progressing   Problem: Pain Managment: Goal: General experience of comfort will improve and/or be controlled Outcome: Progressing   Problem: Safety: Goal: Ability to remain free from injury will improve Outcome: Progressing   Problem: Skin Integrity: Goal: Risk for impaired skin integrity will decrease Outcome: Progressing

## 2024-01-16 NOTE — Progress Notes (Addendum)
 Mobility Specialist Progress Note:   01/16/24 1437  Mobility  Activity Ambulated with assistance (To BR/ in room)  Level of Assistance Standby assist, set-up cues, supervision of patient - no hands on  Assistive Device Front wheel walker  Distance Ambulated (ft) 20 ft  RLE Weight Bearing Per Provider Order WBAT  Activity Response Tolerated well  Mobility Referral Yes  Mobility visit 1 Mobility  Mobility Specialist Start Time (ACUTE ONLY) 1332  Mobility Specialist Stop Time (ACUTE ONLY) 1359  Mobility Specialist Time Calculation (min) (ACUTE ONLY) 27 min   Received pt in bed and agreeable to mobility. Pt requested to use the BR prior to ambulation. Pt ambulated around the room. C/o RLE pain, otherwise tolerated well. Pt left in bed. Personal belongings and call light within reach. All needs met. Officer present.  Lavanda Pollack Mobility Specialist  Please contact via [employer] or  Rehab Office [phone]

## 2024-01-17 DIAGNOSIS — J449 Chronic obstructive pulmonary disease, unspecified: Secondary | ICD-10-CM

## 2024-01-17 DIAGNOSIS — F1110 Opioid abuse, uncomplicated: Secondary | ICD-10-CM

## 2024-01-17 DIAGNOSIS — Z79891 Long term (current) use of opiate analgesic: Secondary | ICD-10-CM

## 2024-01-17 DIAGNOSIS — Z792 Long term (current) use of antibiotics: Secondary | ICD-10-CM

## 2024-01-17 DIAGNOSIS — D539 Nutritional anemia, unspecified: Secondary | ICD-10-CM

## 2024-01-17 DIAGNOSIS — R590 Localized enlarged lymph nodes: Secondary | ICD-10-CM

## 2024-01-17 DIAGNOSIS — Z79899 Other long term (current) drug therapy: Secondary | ICD-10-CM

## 2024-01-17 DIAGNOSIS — A400 Sepsis due to streptococcus, group A: Secondary | ICD-10-CM

## 2024-01-17 LAB — PROTIME-INR
INR: 1.2 (ref 0.8–1.2)
Prothrombin Time: 15.7 s — ABNORMAL HIGH (ref 11.4–15.2)

## 2024-01-17 LAB — CBC
HCT: 22.7 % — ABNORMAL LOW (ref 39.0–52.0)
Hemoglobin: 7.4 g/dL — ABNORMAL LOW (ref 13.0–17.0)
MCH: 25.6 pg — ABNORMAL LOW (ref 26.0–34.0)
MCHC: 32.6 g/dL (ref 30.0–36.0)
MCV: 78.5 fL — ABNORMAL LOW (ref 80.0–100.0)
Platelets: 584 K/uL — ABNORMAL HIGH (ref 150–400)
RBC: 2.89 MIL/uL — ABNORMAL LOW (ref 4.22–5.81)
RDW: 18.4 % — ABNORMAL HIGH (ref 11.5–15.5)
WBC: 8.5 K/uL (ref 4.0–10.5)
nRBC: 0 % (ref 0.0–0.2)

## 2024-01-17 LAB — APTT: aPTT: 31 s (ref 24–36)

## 2024-01-17 MED ORDER — OXYCODONE HCL 5 MG PO TABS
20.0000 mg | ORAL_TABLET | ORAL | Status: DC | PRN
  Administered 2024-01-17 – 2024-01-23 (×45): 20 mg via ORAL
  Filled 2024-01-17 (×31): qty 4

## 2024-01-17 NOTE — Progress Notes (Signed)
 HD#10 SUBJECTIVE:  Patient Summary: Robert Eaton is a 38 y.o. with a pertinent PMH of COPD, polysubstance abuse, abuse disorder on Suboxone , alcohol use disorder without known withdrawal, and major depressive disorder, who presented with right lower extremity edema and jaundice and admitted for sepsis 2/2 right lower extermity cellulitis and fasciitis with presenting leukemoid reaction and cholestasis of sepsis that have resolved.  Overnight Events: None  Interim History:  Patient is doing well this morning with better control of his pain after increased oxycodone  dose.  He had 2 bowel movements yesterday without issue.  No new or worsening complaints.   OBJECTIVE:  Vital Signs: Vitals:   01/15/24 1918 01/16/24 0409 01/16/24 1922 01/17/24 0406  BP: 118/68 115/76 100/72 95/65  Pulse: 92 77 89 86  Resp: 15 17 16 16   Temp: 98.1 F (36.7 C) 97.7 F (36.5 C) 98.3 F (36.8 C) 98.2 F (36.8 C)  TempSrc: Oral Oral Oral Oral  SpO2: 98% 97% 99% 95%  Weight:      Height:       Supplemental O2: Room Air SpO2: 95 % O2 Flow Rate (L/min): 2 L/min  Filed Weights   01/13/24 0622 01/14/24 0457 01/14/24 1319  Weight: 87.2 kg 84.7 kg 84.6 kg     Intake/Output Summary (Last 24 hours) at 01/17/2024 9375 Last data filed at 01/17/2024 0500 Gross per 24 hour  Intake 400 ml  Output 3675 ml  Net -3275 ml   Net IO Since Admission: -17,526.98 mL [01/17/24 0624]  Physical Exam: Constitutional: Well-appearing adult male. In no acute distress. Pulm:Normal work of breathing on room air. MSK: Right lower extremity wrapped in gauze and Ace wrap from the foot to the knee.  No ascending erythema or warmth.  Patient Lines/Drains/Airways Status     Active Line/Drains/Airways     Name Placement date Placement time Site Days   Peripheral IV 01/07/24 20 G Posterior;Right Hand 01/07/24  1254  Hand  1   Peripheral IV 01/07/24 18 G Anterior;Distal;Left;Upper Arm 01/07/24  1317  Arm  1             Pertinent labs and imaging:      Latest Ref Rng & Units 01/17/2024    5:02 AM 01/16/2024    4:37 AM 01/15/2024    6:41 AM  CBC  WBC 4.0 - 10.5 K/uL 8.5  9.3  9.8   Hemoglobin 13.0 - 17.0 g/dL 7.4  7.6  7.8   Hematocrit 39.0 - 52.0 % 22.7  23.4  24.6   Platelets 150 - 400 K/uL 584  619  613        Latest Ref Rng & Units 01/16/2024    4:37 AM 01/15/2024    6:41 AM 01/14/2024    5:23 AM  CMP  Glucose 70 - 99 mg/dL 893  886  891   BUN 6 - 20 mg/dL 6  6  <5   Creatinine 9.38 - 1.24 mg/dL 9.13  9.10  9.10   Sodium 135 - 145 mmol/L 138  131  133   Potassium 3.5 - 5.1 mmol/L 4.6  4.6  4.2   Chloride 98 - 111 mmol/L 103  101  103   CO2 22 - 32 mmol/L 27  24  23    Calcium 8.9 - 10.3 mg/dL 8.3  7.9  8.1   Total Protein 6.5 - 8.1 g/dL 6.7  7.0  7.2   Total Bilirubin 0.0 - 1.2 mg/dL 0.7  0.8  0.8  Alkaline Phos 38 - 126 U/L 92  104  122   AST 15 - 41 U/L 25  33  50   ALT 0 - 44 U/L 28  35  47     No results found.    ASSESSMENT/PLAN:  Assessment: Principal Problem:   Fasciitis Active Problems:   Polysubstance abuse (HCC)   Alcohol use disorder   Opioid use disorder   Cellulitis of right lower extremity   Sepsis (HCC)   Jaundice   Hyperbilirubinemia   Hyponatremia   Hepatic steatosis   Severe protein-calorie malnutrition (HCC)   Necrotizing fasciitis due to Streptococcus pyogenes (HCC)   Hypokalemia  Robert Eaton is a 38 y.o. with a pertinent PMH of COPD, polysubstance abuse, abuse disorder on Suboxone , alcohol use disorder without known withdrawal, and major depressive disorder, who presented with right lower extremity edema and jaundice and admitted for sepsis 2/2 right lower extermity cellulitis with hyperbilirubinemia and severe leukocytosis.   Plan: RLE cellulitis/fasciitis Group A strep Continues to be stable without return of fever or increasing pain.  No signs of ascending infection.  Completed 7 days of linezolid  on 8/7.  Plan continues to be return to the OR on  Wednesday.   - Continue IV Unasyn , day 11 - Oxycodone  15 mg every 4 hours as needed - Dilaudid  0.5 mg IV every 2 hours as needed for breakthrough pain  Microcytic anemia In setting of acute infection and multiple surgeries hemoglobin dropped after admission but has remained stable between 7-8.  7.4 today.  Continues to have mild reactive thrombocytosis.  PT/PTT were mildly elevated yesterday.  PT remains mildly elevated at 15.7 but PTT has normalized.  Holding VTE prophylaxis per orthopedic surgery. - Continue PT/PTT level monitoring for 1 more day - Continue daily CBC  Opiate Use Disorder Holding suboxone  dose 8-2mg  TID as above.  Will resume when appropriate.   Sepsis induced cholestasis, resolved Hyponatremia, resolved  Iliac and inguinal adenopathy Seen on admission CT and noted to be concerning for malignancy but more likely associated with acute infection.  He will follow-up CT outpatient.   COPD Continue albuterol  PRN.  Best Practice: Diet: Regular diet IVF: None VTE: SCDs Start: 01/14/24 1646 SCDs Start: 01/09/24 0716 Code: Full  Disposition planning: Therapy Recs: None, DME: none Family Contact: dad DISPO: Anticipated discharge planning pending return to the OR next week  Signature: Fairy Pool, DO Internal Medicine Resident, PGY-3 Please contact the on call pager at [phone] for any urgent or emergent needs. 6:24 AM 01/17/2024

## 2024-01-17 NOTE — Progress Notes (Signed)
 Patient ID: Robert Eaton, male   DOB: 1986/05/27, 38 y.o.   MRN: [id number] Patient is status post repeat debridement of the necrotizing fasciitis right calf.  Cultures are showing group A strep.  There is 250 cc in the wound VAC canister.  Plan to return to the operating room on Wednesday.  Anticipate this should be the final debridement.

## 2024-01-17 NOTE — Plan of Care (Signed)
   Problem: Clinical Measurements: Goal: Ability to avoid or minimize complications of infection will improve Outcome: Progressing   Problem: Skin Integrity: Goal: Skin integrity will improve Outcome: Progressing   Problem: Education: Goal: Knowledge of General Education information will improve Description: Including pain rating scale, medication(s)/side effects and non-pharmacologic comfort measures Outcome: Progressing   Problem: Health Behavior/Discharge Planning: Goal: Ability to manage health-related needs will improve Outcome: Progressing   Problem: Clinical Measurements: Goal: Ability to maintain clinical measurements within normal limits will improve Outcome: Progressing Goal: Will remain free from infection Outcome: Progressing Goal: Diagnostic test results will improve Outcome: Progressing Goal: Respiratory complications will improve Outcome: Progressing Goal: Cardiovascular complication will be avoided Outcome: Progressing   Problem: Activity: Goal: Risk for activity intolerance will decrease Outcome: Progressing   Problem: Nutrition: Goal: Adequate nutrition will be maintained Outcome: Progressing   Problem: Coping: Goal: Level of anxiety will decrease Outcome: Progressing   Problem: Elimination: Goal: Will not experience complications related to bowel motility Outcome: Progressing Goal: Will not experience complications related to urinary retention Outcome: Progressing   Problem: Pain Managment: Goal: General experience of comfort will improve and/or be controlled Outcome: Progressing   Problem: Safety: Goal: Ability to remain free from injury will improve Outcome: Progressing   Problem: Skin Integrity: Goal: Risk for impaired skin integrity will decrease Outcome: Progressing

## 2024-01-17 NOTE — Plan of Care (Signed)
  Problem: Clinical Measurements: Goal: Ability to avoid or minimize complications of infection will improve Outcome: Progressing   Problem: Skin Integrity: Goal: Skin integrity will improve Outcome: Progressing   Problem: Education: Goal: Knowledge of General Education information will improve Description: Including pain rating scale, medication(s)/side effects and non-pharmacologic comfort measures Outcome: Progressing   Problem: Health Behavior/Discharge Planning: Goal: Ability to manage health-related needs will improve Outcome: Progressing   Problem: Clinical Measurements: Goal: Cardiovascular complication will be avoided Outcome: Progressing

## 2024-01-18 DIAGNOSIS — D509 Iron deficiency anemia, unspecified: Secondary | ICD-10-CM

## 2024-01-18 DIAGNOSIS — B951 Streptococcus, group B, as the cause of diseases classified elsewhere: Secondary | ICD-10-CM

## 2024-01-18 MED ORDER — METHOCARBAMOL 750 MG PO TABS
1500.0000 mg | ORAL_TABLET | Freq: Four times a day (QID) | ORAL | Status: AC
  Administered 2024-01-18 – 2024-01-20 (×18): 1500 mg via ORAL
  Filled 2024-01-18 (×10): qty 2

## 2024-01-18 MED ORDER — ACETAMINOPHEN 500 MG PO TABS
1000.0000 mg | ORAL_TABLET | Freq: Three times a day (TID) | ORAL | Status: DC
  Administered 2024-01-18 – 2024-01-23 (×25): 1000 mg via ORAL
  Filled 2024-01-18 (×17): qty 2

## 2024-01-18 MED ORDER — METHOCARBAMOL 750 MG PO TABS
1500.0000 mg | ORAL_TABLET | Freq: Four times a day (QID) | ORAL | Status: DC
  Administered 2024-01-18: 1500 mg via ORAL
  Filled 2024-01-18: qty 2

## 2024-01-18 MED ORDER — IBUPROFEN 200 MG PO TABS
600.0000 mg | ORAL_TABLET | Freq: Three times a day (TID) | ORAL | Status: DC
  Administered 2024-01-18 (×3): 600 mg via ORAL
  Filled 2024-01-18 (×3): qty 3

## 2024-01-18 NOTE — Plan of Care (Signed)
   Problem: Clinical Measurements: Goal: Ability to avoid or minimize complications of infection will improve Outcome: Progressing   Problem: Skin Integrity: Goal: Skin integrity will improve Outcome: Progressing   Problem: Education: Goal: Knowledge of General Education information will improve Description: Including pain rating scale, medication(s)/side effects and non-pharmacologic comfort measures Outcome: Progressing   Problem: Health Behavior/Discharge Planning: Goal: Ability to manage health-related needs will improve Outcome: Progressing   Problem: Clinical Measurements: Goal: Ability to maintain clinical measurements within normal limits will improve Outcome: Progressing Goal: Will remain free from infection Outcome: Progressing Goal: Diagnostic test results will improve Outcome: Progressing Goal: Respiratory complications will improve Outcome: Progressing Goal: Cardiovascular complication will be avoided Outcome: Progressing   Problem: Activity: Goal: Risk for activity intolerance will decrease Outcome: Progressing   Problem: Nutrition: Goal: Adequate nutrition will be maintained Outcome: Progressing   Problem: Coping: Goal: Level of anxiety will decrease Outcome: Progressing   Problem: Elimination: Goal: Will not experience complications related to bowel motility Outcome: Progressing Goal: Will not experience complications related to urinary retention Outcome: Progressing   Problem: Pain Managment: Goal: General experience of comfort will improve and/or be controlled Outcome: Progressing   Problem: Safety: Goal: Ability to remain free from injury will improve Outcome: Progressing   Problem: Skin Integrity: Goal: Risk for impaired skin integrity will decrease Outcome: Progressing

## 2024-01-18 NOTE — Progress Notes (Signed)
 HD#11 SUBJECTIVE:  Patient Summary: Robert Eaton is a 38 y.o. with a pertinent PMH of COPD, polysubstance abuse, abuse disorder on Suboxone , alcohol use disorder without known withdrawal, and major depressive disorder, who presented with right lower extremity edema and jaundice and admitted for sepsis 2/2 right lower extermity cellulitis and fasciitis with presenting leukemoid reaction and cholestasis of sepsis that have resolved.  Overnight Events: None  Interim History:  Overall stable this morning.  The increase in oxycodone  helped with his pain and it is tolerable.  He did not have a bowel movement yesterday and not yet today.  Denies any abdominal pain, nausea, vomiting.   OBJECTIVE:  Vital Signs: Vitals:   01/17/24 0742 01/17/24 1554 01/17/24 1945 01/18/24 0418  BP: 101/67 111/63 97/63 105/63  Pulse: 84 71 78 81  Resp: 18 20 16 16   Temp: 98 F (36.7 C) 98 F (36.7 C) 98.4 F (36.9 C) 98.1 F (36.7 C)  TempSrc: Oral Oral Oral Oral  SpO2: 97% 97% 97% 96%  Weight:      Height:       Supplemental O2: Room Air SpO2: 96 % O2 Flow Rate (L/min): 2 L/min  Filed Weights   01/13/24 0622 01/14/24 0457 01/14/24 1319  Weight: 87.2 kg 84.7 kg 84.6 kg     Intake/Output Summary (Last 24 hours) at 01/18/2024 0601 Last data filed at 01/17/2024 2236 Gross per 24 hour  Intake 350 ml  Output 800 ml  Net -450 ml   Net IO Since Admission: -17,976.98 mL [01/18/24 0601]  Physical Exam: Constitutional: Well-appearing adult male. In no acute distress. Pulm:Normal work of breathing on room air. MSK: Right lower extremity wrapped in gauze and Ace wrap from the foot to the knee.  No ascending erythema or warmth about the knee    Pertinent labs and imaging:      Latest Ref Rng & Units 01/17/2024    5:02 AM 01/16/2024    4:37 AM 01/15/2024    6:41 AM  CBC  WBC 4.0 - 10.5 K/uL 8.5  9.3  9.8   Hemoglobin 13.0 - 17.0 g/dL 7.4  7.6  7.8   Hematocrit 39.0 - 52.0 % 22.7  23.4  24.6    Platelets 150 - 400 K/uL 584  619  613        Latest Ref Rng & Units 01/16/2024    4:37 AM 01/15/2024    6:41 AM 01/14/2024    5:23 AM  CMP  Glucose 70 - 99 mg/dL 893  886  891   BUN 6 - 20 mg/dL 6  6  <5   Creatinine 9.38 - 1.24 mg/dL 9.13  9.10  9.10   Sodium 135 - 145 mmol/L 138  131  133   Potassium 3.5 - 5.1 mmol/L 4.6  4.6  4.2   Chloride 98 - 111 mmol/L 103  101  103   CO2 22 - 32 mmol/L 27  24  23    Calcium 8.9 - 10.3 mg/dL 8.3  7.9  8.1   Total Protein 6.5 - 8.1 g/dL 6.7  7.0  7.2   Total Bilirubin 0.0 - 1.2 mg/dL 0.7  0.8  0.8   Alkaline Phos 38 - 126 U/L 92  104  122   AST 15 - 41 U/L 25  33  50   ALT 0 - 44 U/L 28  35  47     No results found.    ASSESSMENT/PLAN:  Assessment: Principal Problem:  Fasciitis Active Problems:   Polysubstance abuse (HCC)   Alcohol use disorder   Opioid use disorder   Cellulitis of right lower extremity   Sepsis (HCC)   Jaundice   Hyperbilirubinemia   Hyponatremia   Hepatic steatosis   Severe protein-calorie malnutrition (HCC)   Necrotizing fasciitis due to Streptococcus pyogenes (HCC)   Hypokalemia  Robert Eaton is a 38 y.o. with a pertinent PMH of COPD, polysubstance abuse, abuse disorder on Suboxone , alcohol use disorder without known withdrawal, and major depressive disorder, who presented with right lower extremity edema and jaundice and admitted for sepsis 2/2 right lower extermity cellulitis with hyperbilirubinemia and severe leukocytosis.   Plan: RLE cellulitis/fasciitis Group A strep Continues to be stable without return of fever or increasing pain.  No signs of ascending infection.  Completed 7 days of linezolid  on 8/7.  Plan continues to be return to the OR on Wednesday.   - Continue IV Unasyn , day 12 - Oxycodone  20 mg every 4 hours as needed - Dilaudid  0.5 mg IV every 2 hours as needed for breakthrough pain - Schedule Tylenol  1000 mg every 8 hours, ibuprofen  600 mg every 8 hours, and 3 days of Robaxin  1500 mg  every 6 hours - Could add gabapentin next  Microcytic anemia In setting of acute infection and multiple surgeries hemoglobin dropped after admission but has remained stable between 7-8.  7.4 yesterday.  PT yesterday remains mildly elevated at 15.7 but PTT has normalized.  Holding VTE prophylaxis per orthopedic surgery. - PT/PTT level tomorrow morning - Continue daily CBC  Opiate Use Disorder Holding suboxone  dose 8-2mg  TID as above.  Will resume when appropriate.   Sepsis induced cholestasis, resolved Hyponatremia, resolved  Iliac and inguinal adenopathy Seen on admission CT and noted to be concerning for malignancy but more likely associated with acute infection.  He will follow-up CT outpatient.   COPD Continue albuterol  PRN.  Best Practice: Diet: Regular diet IVF: None VTE: SCDs Start: 01/14/24 1646 SCDs Start: 01/09/24 0716 Code: Full  Disposition planning: Therapy Recs: None, DME: none Family Contact: dad DISPO: Anticipated discharge planning pending return to the OR next week  Signature: Fairy Pool, DO Internal Medicine Resident, PGY-3 Please contact the on call pager at [phone] for any urgent or emergent needs. 6:01 AM 01/18/2024

## 2024-01-19 LAB — APTT: aPTT: 34 s (ref 24–36)

## 2024-01-19 LAB — CBC
HCT: 23.8 % — ABNORMAL LOW (ref 39.0–52.0)
Hemoglobin: 7.5 g/dL — ABNORMAL LOW (ref 13.0–17.0)
MCH: 25.3 pg — ABNORMAL LOW (ref 26.0–34.0)
MCHC: 31.5 g/dL (ref 30.0–36.0)
MCV: 80.4 fL (ref 80.0–100.0)
Platelets: 632 K/uL — ABNORMAL HIGH (ref 150–400)
RBC: 2.96 MIL/uL — ABNORMAL LOW (ref 4.22–5.81)
RDW: 18.9 % — ABNORMAL HIGH (ref 11.5–15.5)
WBC: 8.3 K/uL (ref 4.0–10.5)
nRBC: 0 % (ref 0.0–0.2)

## 2024-01-19 LAB — PROTIME-INR
INR: 1.2 (ref 0.8–1.2)
Prothrombin Time: 15.8 s — ABNORMAL HIGH (ref 11.4–15.2)

## 2024-01-19 NOTE — Progress Notes (Signed)
 HD#12 SUBJECTIVE:  Patient Summary: Robert Eaton is a 38 y.o. with a pertinent PMH of COPD, polysubstance abuse, abuse disorder on Suboxone , alcohol use disorder without known withdrawal, and major depressive disorder, who presented with right lower extremity edema and jaundice and admitted for sepsis 2/2 right lower extermity cellulitis and fasciitis with presenting leukemoid reaction and cholestasis of sepsis that have resolved.  Overnight Events: None  Interim History:  Overall stable this morning.  The increase in oxycodone  helped with his pain and it is tolerable.  He has had one bowel movement yesterday that was normal in appearance. Denies any abdominal pain, nausea, vomiting.   OBJECTIVE:  Vital Signs: Vitals:   01/18/24 1621 01/18/24 1931 01/19/24 0424 01/19/24 0750  BP: 97/63 96/61 102/65 98/66  Pulse: 77 74 76 82  Resp: 18 16 16 19   Temp: 97.7 F (36.5 C) 98 F (36.7 C) 98 F (36.7 C) 97.8 F (36.6 C)  TempSrc: Oral Oral Oral   SpO2: 96% 97% 96% 96%  Weight:      Height:       Supplemental O2: Room Air SpO2: 96 % O2 Flow Rate (L/min): 2 L/min  Filed Weights   01/13/24 0622 01/14/24 0457 01/14/24 1319  Weight: 87.2 kg 84.7 kg 84.6 kg     Intake/Output Summary (Last 24 hours) at 01/19/2024 1126 Last data filed at 01/19/2024 0900 Gross per 24 hour  Intake --  Output 2410 ml  Net -2410 ml   Net IO Since Admission: -22,586.98 mL [01/19/24 1126]  Physical Exam: Constitutional: Well-appearing adult male. In no acute distress. Pulm:Normal work of breathing on room air. MSK: Right lower extremity wrapped in gauze and Ace wrap from the foot to the knee.  No ascending erythema or warmth about the knee    Pertinent labs and imaging:      Latest Ref Rng & Units 01/19/2024    3:30 AM 01/17/2024    5:02 AM 01/16/2024    4:37 AM  CBC  WBC 4.0 - 10.5 K/uL 8.3  8.5  9.3   Hemoglobin 13.0 - 17.0 g/dL 7.5  7.4  7.6   Hematocrit 39.0 - 52.0 % 23.8  22.7  23.4    Platelets 150 - 400 K/uL 632  584  619        Latest Ref Rng & Units 01/16/2024    4:37 AM 01/15/2024    6:41 AM 01/14/2024    5:23 AM  CMP  Glucose 70 - 99 mg/dL 893  886  891   BUN 6 - 20 mg/dL 6  6  <5   Creatinine 9.38 - 1.24 mg/dL 9.13  9.10  9.10   Sodium 135 - 145 mmol/L 138  131  133   Potassium 3.5 - 5.1 mmol/L 4.6  4.6  4.2   Chloride 98 - 111 mmol/L 103  101  103   CO2 22 - 32 mmol/L 27  24  23    Calcium 8.9 - 10.3 mg/dL 8.3  7.9  8.1   Total Protein 6.5 - 8.1 g/dL 6.7  7.0  7.2   Total Bilirubin 0.0 - 1.2 mg/dL 0.7  0.8  0.8   Alkaline Phos 38 - 126 U/L 92  104  122   AST 15 - 41 U/L 25  33  50   ALT 0 - 44 U/L 28  35  47     No results found.    ASSESSMENT/PLAN:  Assessment: Principal Problem:   Fasciitis Active  Problems:   Polysubstance abuse (HCC)   Alcohol use disorder   Opioid use disorder   Cellulitis of right lower extremity   Sepsis (HCC)   Jaundice   Hyperbilirubinemia   Hyponatremia   Hepatic steatosis   Severe protein-calorie malnutrition (HCC)   Necrotizing fasciitis due to Streptococcus pyogenes (HCC)   Hypokalemia  Robert Eaton is a 38 y.o. with a pertinent PMH of COPD, polysubstance abuse, abuse disorder on Suboxone , alcohol use disorder without known withdrawal, and major depressive disorder, who presented with right lower extremity edema and jaundice and admitted for sepsis 2/2 right lower extermity cellulitis with hyperbilirubinemia and severe leukocytosis.   Plan: RLE cellulitis/fasciitis Group A strep Continues to be stable without return of fever or increasing pain.  No signs of ascending infection.  Completed 7 days of linezolid  on 8/7.  Plan continues to be return to the OR on Wednesday.  Stopping ibuprofen  given patient's PT is still mildly elevated.  - Continue IV Unasyn , day 13 - Oxycodone  20 mg every 4 hours as needed - Dilaudid  0.5 mg IV every 2 hours as needed for breakthrough pain - Schedule Tylenol  1000 mg every 8 hours  and 3 days of Robaxin  1500 mg every 6 hours - Could add gabapentin next  Microcytic anemia In setting of acute infection and multiple surgeries hemoglobin dropped after admission but has remained stable between 7-8.  7.5 today.  PT yesterday remains mildly elevated at 15.8 but PTT has normalized.  Holding VTE prophylaxis per orthopedic surgery. - PT/PTT level tomorrow morning - Continue daily CBC  Opiate Use Disorder Holding suboxone  dose 8-2mg  TID as above.  Will resume when appropriate.   Sepsis induced cholestasis, resolved Hyponatremia, resolved  Iliac and inguinal adenopathy Seen on admission CT and noted to be concerning for malignancy but more likely associated with acute infection.  He will follow-up CT before discharge.   COPD Continue albuterol  PRN.  Best Practice: Diet: Regular diet IVF: None VTE: SCDs Start: 01/14/24 1646 SCDs Start: 01/09/24 0716 Code: Full  Disposition planning: Therapy Recs: None, DME: none Family Contact: dad DISPO: Anticipated discharge planning pending return to the OR next week  Signature: Elsie Baynes, DO Internal Medicine Resident, PGY-1 Please contact the on call pager at [phone] for any urgent or emergent needs. 11:26 AM 01/19/2024

## 2024-01-19 NOTE — Plan of Care (Signed)
  Problem: Clinical Measurements: Goal: Ability to avoid or minimize complications of infection will improve Outcome: Progressing   Problem: Skin Integrity: Goal: Skin integrity will improve Outcome: Progressing   Problem: Education: Goal: Knowledge of General Education information will improve Description: Including pain rating scale, medication(s)/side effects and non-pharmacologic comfort measures Outcome: Progressing   Problem: Health Behavior/Discharge Planning: Goal: Ability to manage health-related needs will improve Outcome: Progressing   Problem: Clinical Measurements: Goal: Ability to maintain clinical measurements within normal limits will improve Outcome: Progressing Goal: Will remain free from infection Outcome: Progressing Goal: Diagnostic test results will improve Outcome: Progressing Goal: Respiratory complications will improve Outcome: Progressing Goal: Cardiovascular complication will be avoided Outcome: Progressing   Problem: Activity: Goal: Risk for activity intolerance will decrease Outcome: Progressing   Problem: Nutrition: Goal: Adequate nutrition will be maintained Outcome: Progressing   Problem: Coping: Goal: Level of anxiety will decrease Outcome: Progressing   Problem: Elimination: Goal: Will not experience complications related to bowel motility Outcome: Progressing Goal: Will not experience complications related to urinary retention Outcome: Progressing   Problem: Pain Managment: Goal: General experience of comfort will improve and/or be controlled Outcome: Progressing   Problem: Safety: Goal: Ability to remain free from injury will improve Outcome: Progressing   Problem: Skin Integrity: Goal: Risk for impaired skin integrity will decrease Outcome: Progressing
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
## 2024-01-19 NOTE — TOC Progression Note (Signed)
 Transition of Care [facility]) - Progression Note    Patient Details  Name: Grayson Pfefferle MRN: [id number] Date of Birth: Dec 22, 1985  Transition of Care [facility]) CM/SW Contact  Lauraine FORBES Saa, LCSWA Phone Number: 01/19/2024, 3:30 PM  Clinical Narrative:     3:30 PM Per chart review, patient is to return to OR Wednesday for expected final debridement to address right calf necrotizing fasciitis. TOC will continue to follow and be available to assist.   Expected Discharge Plan:  [facility]) Barriers to Discharge: Continued Medical Work up               Expected Discharge Plan and Services       Living arrangements for the past 2 months:  ([facility])                                       Social Drivers of Health (SDOH) Interventions SDOH Screenings   Food Insecurity: Patient Declined (01/15/2024)  Housing: High Risk (01/15/2024)  Transportation Needs: No Transportation Needs (01/15/2024)  Utilities: Not At Risk (01/08/2024)  Alcohol Screen: Low Risk  (09/18/2023)  Depression (PHQ2-9): Medium Risk (09/18/2023)  Tobacco Use: High Risk (01/14/2024)    Readmission Risk Interventions     No data to display

## 2024-01-20 LAB — CBC
HCT: 24.4 % — ABNORMAL LOW (ref 39.0–52.0)
Hemoglobin: 7.7 g/dL — ABNORMAL LOW (ref 13.0–17.0)
MCH: 25.2 pg — ABNORMAL LOW (ref 26.0–34.0)
MCHC: 31.6 g/dL (ref 30.0–36.0)
MCV: 80 fL (ref 80.0–100.0)
Platelets: 645 K/uL — ABNORMAL HIGH (ref 150–400)
RBC: 3.05 MIL/uL — ABNORMAL LOW (ref 4.22–5.81)
RDW: 19.2 % — ABNORMAL HIGH (ref 11.5–15.5)
WBC: 7.3 K/uL (ref 4.0–10.5)
nRBC: 0 % (ref 0.0–0.2)

## 2024-01-20 NOTE — Plan of Care (Signed)
  Problem: Clinical Measurements: Goal: Ability to avoid or minimize complications of infection will improve Outcome: Progressing   Problem: Skin Integrity: Goal: Skin integrity will improve Outcome: Progressing   Problem: Education: Goal: Knowledge of General Education information will improve Description: Including pain rating scale, medication(s)/side effects and non-pharmacologic comfort measures Outcome: Progressing   Problem: Health Behavior/Discharge Planning: Goal: Ability to manage health-related needs will improve Outcome: Progressing   Problem: Clinical Measurements: Goal: Ability to maintain clinical measurements within normal limits will improve Outcome: Progressing Goal: Will remain free from infection Outcome: Progressing Goal: Diagnostic test results will improve Outcome: Progressing Goal: Respiratory complications will improve Outcome: Progressing Goal: Cardiovascular complication will be avoided Outcome: Progressing   Problem: Activity: Goal: Risk for activity intolerance will decrease Outcome: Progressing   Problem: Nutrition: Goal: Adequate nutrition will be maintained Outcome: Progressing   Problem: Coping: Goal: Level of anxiety will decrease Outcome: Progressing   Problem: Elimination: Goal: Will not experience complications related to bowel motility Outcome: Progressing Goal: Will not experience complications related to urinary retention Outcome: Progressing   Problem: Pain Managment: Goal: General experience of comfort will improve and/or be controlled Outcome: Progressing   Problem: Safety: Goal: Ability to remain free from injury will improve Outcome: Progressing   Problem: Skin Integrity: Goal: Risk for impaired skin integrity will decrease Outcome: Progressing

## 2024-01-20 NOTE — H&P (View-Only) (Signed)
 Patient ID: Robert Eaton, male   DOB: 02/20/1986, 38 y.o.   MRN: [id number] Patient is status post necrotizing fasciitis debridement with cultures showing group A strep.  Will plan for repeat debridement on Wednesday.  Patient may discharge with the portable Prevena pump after drainage has stabilized.

## 2024-01-20 NOTE — Progress Notes (Signed)
 HD#13 SUBJECTIVE:  Patient Summary: Robert Eaton is a 38 y.o. with a pertinent PMH of COPD, polysubstance abuse, abuse disorder on Suboxone , alcohol use disorder without known withdrawal, and major depressive disorder, who presented with right lower extremity edema and jaundice and admitted for sepsis 2/2 right lower extermity cellulitis and fasciitis with presenting leukemoid reaction and cholestasis of sepsis that have resolved.  Overnight Events: None  Interim History:  Overall stable this morning. He is not complaining of pain today and states he is continuing with his physical therapy sessions to strengthen his right lower leg.    OBJECTIVE:  Vital Signs: Vitals:   01/19/24 2026 01/20/24 0300 01/20/24 0500 01/20/24 0739  BP: 102/67 112/78  104/64  Pulse: 86 78  84  Resp: 18 18    Temp: 97.8 F (36.6 C) 98 F (36.7 C)  98.1 F (36.7 C)  TempSrc:  Oral  Oral  SpO2: 97% 96%  96%  Weight:   85.3 kg   Height:       Supplemental O2: Room Air SpO2: 96 % O2 Flow Rate (L/min): 2 L/min  Filed Weights   01/14/24 0457 01/14/24 1319 01/20/24 0500  Weight: 84.7 kg 84.6 kg 85.3 kg     Intake/Output Summary (Last 24 hours) at 01/20/2024 0927 Last data filed at 01/20/2024 0500 Gross per 24 hour  Intake 480 ml  Output 3475 ml  Net -2995 ml   Net IO Since Admission: -25,581.98 mL [01/20/24 0927]  Physical Exam: Constitutional: Well-appearing adult male. In no acute distress. Pulm:Normal work of breathing on room air. MSK: Right lower extremity wrapped in gauze and Ace wrap from the foot to the knee.  No ascending erythema or warmth about the knee    Pertinent labs and imaging:      Latest Ref Rng & Units 01/20/2024    6:42 AM 01/19/2024    3:30 AM 01/17/2024    5:02 AM  CBC  WBC 4.0 - 10.5 K/uL 7.3  8.3  8.5   Hemoglobin 13.0 - 17.0 g/dL 7.7  7.5  7.4   Hematocrit 39.0 - 52.0 % 24.4  23.8  22.7   Platelets 150 - 400 K/uL 645  632  584        Latest Ref Rng & Units  01/16/2024    4:37 AM 01/15/2024    6:41 AM 01/14/2024    5:23 AM  CMP  Glucose 70 - 99 mg/dL 893  886  891   BUN 6 - 20 mg/dL 6  6  <5   Creatinine 9.38 - 1.24 mg/dL 9.13  9.10  9.10   Sodium 135 - 145 mmol/L 138  131  133   Potassium 3.5 - 5.1 mmol/L 4.6  4.6  4.2   Chloride 98 - 111 mmol/L 103  101  103   CO2 22 - 32 mmol/L 27  24  23    Calcium 8.9 - 10.3 mg/dL 8.3  7.9  8.1   Total Protein 6.5 - 8.1 g/dL 6.7  7.0  7.2   Total Bilirubin 0.0 - 1.2 mg/dL 0.7  0.8  0.8   Alkaline Phos 38 - 126 U/L 92  104  122   AST 15 - 41 U/L 25  33  50   ALT 0 - 44 U/L 28  35  47     No results found.    ASSESSMENT/PLAN:  Assessment: Principal Problem:   Fasciitis Active Problems:   Polysubstance abuse (HCC)   Alcohol  use disorder   Opioid use disorder   Cellulitis of right lower extremity   Sepsis (HCC)   Jaundice   Hyperbilirubinemia   Hyponatremia   Hepatic steatosis   Severe protein-calorie malnutrition (HCC)   Necrotizing fasciitis due to Streptococcus pyogenes (HCC)   Hypokalemia  Robert Eaton is a 38 y.o. with a pertinent PMH of COPD, polysubstance abuse, abuse disorder on Suboxone , alcohol use disorder without known withdrawal, and major depressive disorder, who presented with right lower extremity edema and jaundice and admitted for sepsis 2/2 right lower extermity cellulitis with hyperbilirubinemia and severe leukocytosis.   Plan: RLE cellulitis/fasciitis Group A strep Continues to be stable without return of fever or increasing pain.  No signs of ascending infection.  Completed 7 days of linezolid  on 8/7.  Plan continues to be return to the OR tomorrow. Will be NPO at midnight. Plan to replenish iron after I&D and completion of IV antibiotics. Will give IV iron later this week. - Continue IV Unasyn , day 14 - Oxycodone  20 mg every 4 hours as needed - Dilaudid  0.5 mg IV every 2 hours as needed for breakthrough pain - Schedule Tylenol  1000 mg every 8 hours and 3 days of  Robaxin  1500 mg every 6 hours - Could add gabapentin next -- IV iron for Thursday or Friday this week  Microcytic anemia In setting of acute infection and multiple surgeries hemoglobin dropped after admission but has remained stable between 7-8. Holding VTE prophylaxis per orthopedic surgery. - Continue daily CBC  Opiate Use Disorder Holding suboxone  dose 8-2mg  TID as above.  Will resume when appropriate.   Sepsis induced cholestasis, resolved Hyponatremia, resolved  Iliac and inguinal adenopathy Seen on admission CT and noted to be concerning for malignancy but more likely associated with acute infection.  He will follow-up CT before discharge.   COPD Continue albuterol  PRN.  Best Practice: Diet: Regular diet IVF: None VTE: SCDs Start: 01/14/24 1646 SCDs Start: 01/09/24 0716 Code: Full  Disposition planning: Therapy Recs: None, DME: none Family Contact: dad DISPO: Anticipated discharge planning pending return to the OR next week  Signature: Joash Tony, DO Internal Medicine Resident, PGY-1 Please contact the on call pager at [phone] for any urgent or emergent needs. 9:27 AM 01/20/2024

## 2024-01-20 NOTE — Progress Notes (Signed)
 Mobility Specialist Progress Note:   01/20/24 1535  Mobility  Activity Ambulated with assistance (In hallway/ to BR)  Level of Assistance Standby assist, set-up cues, supervision of patient - no hands on  Assistive Device Front wheel walker  Distance Ambulated (ft) 100 ft  RLE Weight Bearing Per Provider Order WBAT  Activity Response Tolerated well  Mobility Referral Yes  Mobility visit 1 Mobility  Mobility Specialist Start Time (ACUTE ONLY) 1454  Mobility Specialist Stop Time (ACUTE ONLY) 1532  Mobility Specialist Time Calculation (min) (ACUTE ONLY) 38 min   Received pt in bed and agreeable to mobility. No physical assistance needed. C/o RLE pain, otherwise tolerated well. Returned to room without fault. Pt requested to use the BR before laying down in bed. Pt returned to bed. Personal belongings and call light within reach. All needs met. Officer present.  Lavanda Pollack Mobility Specialist  Please contact via [employer] or  Rehab Office [phone]

## 2024-01-20 NOTE — Progress Notes (Signed)
 Patient ID: Robert Eaton, male   DOB: 02/20/1986, 38 y.o.   MRN: [id number] Patient is status post necrotizing fasciitis debridement with cultures showing group A strep.  Will plan for repeat debridement on Wednesday.  Patient may discharge with the portable Prevena pump after drainage has stabilized.

## 2024-01-21 ENCOUNTER — Other Ambulatory Visit: Payer: Self-pay

## 2024-01-21 ENCOUNTER — Encounter (HOSPITAL_COMMUNITY): Admission: EM | Payer: Self-pay | Attending: Internal Medicine

## 2024-01-21 ENCOUNTER — Inpatient Hospital Stay (HOSPITAL_COMMUNITY): Payer: Self-pay | Admitting: Anesthesiology

## 2024-01-21 ENCOUNTER — Encounter (HOSPITAL_COMMUNITY): Payer: Self-pay | Admitting: Internal Medicine

## 2024-01-21 DIAGNOSIS — F17210 Nicotine dependence, cigarettes, uncomplicated: Secondary | ICD-10-CM | POA: Diagnosis not present

## 2024-01-21 DIAGNOSIS — J449 Chronic obstructive pulmonary disease, unspecified: Secondary | ICD-10-CM | POA: Diagnosis not present

## 2024-01-21 DIAGNOSIS — M726 Necrotizing fasciitis: Secondary | ICD-10-CM | POA: Diagnosis not present

## 2024-01-21 DIAGNOSIS — M729 Fibroblastic disorder, unspecified: Secondary | ICD-10-CM | POA: Diagnosis not present

## 2024-01-21 DIAGNOSIS — B950 Streptococcus, group A, as the cause of diseases classified elsewhere: Secondary | ICD-10-CM | POA: Diagnosis not present

## 2024-01-21 HISTORY — PX: INCISION AND DRAINAGE OF DEEP ABSCESS, CALF: SHX7361

## 2024-01-21 LAB — BASIC METABOLIC PANEL WITH GFR
Anion gap: 6 (ref 5–15)
BUN: 9 mg/dL (ref 6–20)
CO2: 23 mmol/L (ref 22–32)
Calcium: 8.6 mg/dL — ABNORMAL LOW (ref 8.9–10.3)
Chloride: 106 mmol/L (ref 98–111)
Creatinine, Ser: 0.75 mg/dL (ref 0.61–1.24)
GFR, Estimated: >60 mL/min (ref >60)
Glucose, Bld: 95 mg/dL (ref 70–99)
Potassium: 3.6 mmol/L (ref 3.5–5.1)
Sodium: 135 mmol/L (ref 135–145)

## 2024-01-21 LAB — CBC
HCT: 25.8 % — ABNORMAL LOW (ref 39.0–52.0)
Hemoglobin: 8.2 g/dL — ABNORMAL LOW (ref 13.0–17.0)
MCH: 25.5 pg — ABNORMAL LOW (ref 26.0–34.0)
MCHC: 31.8 g/dL (ref 30.0–36.0)
MCV: 80.1 fL (ref 80.0–100.0)
Platelets: 584 K/uL — ABNORMAL HIGH (ref 150–400)
RBC: 3.22 MIL/uL — ABNORMAL LOW (ref 4.22–5.81)
RDW: 19.2 % — ABNORMAL HIGH (ref 11.5–15.5)
WBC: 8.1 K/uL (ref 4.0–10.5)
nRBC: 0 % (ref 0.0–0.2)

## 2024-01-21 SURGERY — INCISION AND DRAINAGE OF DEEP ABSCESS, CALF
Anesthesia: General | Site: Leg Lower | Laterality: Right

## 2024-01-21 MED ORDER — PROPOFOL 10 MG/ML IV BOLUS
INTRAVENOUS | Status: AC
  Filled 2024-01-21: qty 20

## 2024-01-21 MED ORDER — HYDROMORPHONE HCL 1 MG/ML IJ SOLN
INTRAMUSCULAR | Status: AC
  Filled 2024-01-21: qty 1

## 2024-01-21 MED ORDER — HYDROMORPHONE HCL 1 MG/ML IJ SOLN
INTRAMUSCULAR | Status: AC
  Filled 2024-01-21: qty 0.5

## 2024-01-21 MED ORDER — DEXAMETHASONE SODIUM PHOSPHATE 10 MG/ML IJ SOLN
INTRAMUSCULAR | Status: DC | PRN
  Administered 2024-01-21 (×2): 4 mg via INTRAVENOUS

## 2024-01-21 MED ORDER — FENTANYL CITRATE (PF) 100 MCG/2ML IJ SOLN
25.0000 ug | INTRAMUSCULAR | Status: DC | PRN
  Administered 2024-01-21 (×6): 50 ug via INTRAVENOUS

## 2024-01-21 MED ORDER — VASHE WOUND IRRIGATION OPTIME
TOPICAL | Status: DC | PRN
  Administered 2024-01-21 (×2): 34 [oz_av]

## 2024-01-21 MED ORDER — ORAL CARE MOUTH RINSE: 15.0000 mL | Freq: Once | OROMUCOSAL | Status: AC

## 2024-01-21 MED ORDER — VANCOMYCIN HCL 1000 MG IV SOLR
INTRAVENOUS | Status: AC
Start: 2024-01-21 — End: 2024-01-21
  Filled 2024-01-21: qty 20

## 2024-01-21 MED ORDER — HYDROMORPHONE HCL 1 MG/ML IJ SOLN
0.2500 mg | INTRAMUSCULAR | Status: DC | PRN
  Administered 2024-01-21 (×8): 0.5 mg via INTRAVENOUS

## 2024-01-21 MED ORDER — MIDAZOLAM HCL 2 MG/2ML IJ SOLN
INTRAMUSCULAR | Status: AC
Start: 2024-01-21 — End: 2024-01-21
  Filled 2024-01-21: qty 2

## 2024-01-21 MED ORDER — MIDAZOLAM HCL 2 MG/2ML IJ SOLN
INTRAMUSCULAR | Status: DC | PRN
  Administered 2024-01-21 (×2): 2 mg via INTRAVENOUS

## 2024-01-21 MED ORDER — VANCOMYCIN HCL 1000 MG IV SOLR
INTRAVENOUS | Status: DC | PRN
Start: 2024-01-21 — End: 2024-01-21
  Administered 2024-01-21 (×2): 1000 mg

## 2024-01-21 MED ORDER — OXYCODONE HCL 5 MG/5ML PO SOLN: 5.0000 mg | Freq: Once | ORAL | Status: DC | PRN

## 2024-01-21 MED ORDER — LIDOCAINE 2% (20 MG/ML) 5 ML SYRINGE
INTRAMUSCULAR | Status: DC | PRN
  Administered 2024-01-21 (×2): 1 mg via INTRAVENOUS

## 2024-01-21 MED ORDER — FENTANYL CITRATE (PF) 100 MCG/2ML IJ SOLN
INTRAMUSCULAR | Status: AC
Start: 2024-01-21 — End: 2024-01-21
  Filled 2024-01-21: qty 2

## 2024-01-21 MED ORDER — DEXMEDETOMIDINE HCL IN NACL 80 MCG/20ML IV SOLN
INTRAVENOUS | Status: DC | PRN
  Administered 2024-01-21: 12 ug via INTRAVENOUS
  Administered 2024-01-21 (×2): 8 ug via INTRAVENOUS
  Administered 2024-01-21: 12 ug via INTRAVENOUS

## 2024-01-21 MED ORDER — LACTATED RINGERS IV SOLN: INTRAVENOUS | Status: DC

## 2024-01-21 MED ORDER — POVIDONE-IODINE 10 % EX SWAB
2.0000 | Freq: Once | CUTANEOUS | Status: AC
  Administered 2024-01-21 (×2): 2 via TOPICAL

## 2024-01-21 MED ORDER — CHLORHEXIDINE GLUCONATE 4 % EX SOLN
60.0000 mL | Freq: Once | CUTANEOUS | Status: DC
  Filled 2024-01-21: qty 60

## 2024-01-21 MED ORDER — CEFAZOLIN SODIUM-DEXTROSE 2-4 GM/100ML-% IV SOLN: 2.0000 g | INTRAVENOUS | Status: DC

## 2024-01-21 MED ORDER — CHLORHEXIDINE GLUCONATE 0.12 % MT SOLN
15.0000 mL | Freq: Once | OROMUCOSAL | Status: AC
  Administered 2024-01-21 (×2): 15 mL via OROMUCOSAL
  Filled 2024-01-21: qty 15

## 2024-01-21 MED ORDER — LIDOCAINE 2% (20 MG/ML) 5 ML SYRINGE
INTRAMUSCULAR | Status: AC
Start: 2024-01-21 — End: 2024-01-21
  Filled 2024-01-21: qty 5

## 2024-01-21 MED ORDER — FENTANYL CITRATE (PF) 100 MCG/2ML IJ SOLN
INTRAMUSCULAR | Status: AC
  Filled 2024-01-21: qty 2

## 2024-01-21 MED ORDER — PROPOFOL 10 MG/ML IV BOLUS
INTRAVENOUS | Status: DC | PRN
  Administered 2024-01-21 (×2): 200 mg via INTRAVENOUS

## 2024-01-21 MED ORDER — AMISULPRIDE (ANTIEMETIC) 5 MG/2ML IV SOLN: 10.0000 mg | Freq: Once | INTRAVENOUS | Status: DC | PRN

## 2024-01-21 MED ORDER — OXYCODONE HCL 5 MG PO TABS: 5.0000 mg | ORAL_TABLET | Freq: Once | ORAL | Status: DC | PRN

## 2024-01-21 MED ORDER — ACETAMINOPHEN 10 MG/ML IV SOLN: 1000.0000 mg | Freq: Once | INTRAVENOUS | Status: DC | PRN

## 2024-01-21 SURGICAL SUPPLY — 40 items
BAG COUNTER SPONGE SURGICOUNT (BAG) IMPLANT
BLADE SURG 21 STRL SS (BLADE) ×1 IMPLANT
BNDG COHESIVE 4X5 TAN STRL LF (GAUZE/BANDAGES/DRESSINGS) IMPLANT
BNDG COHESIVE 6X5 TAN NS LF (GAUZE/BANDAGES/DRESSINGS) IMPLANT
BNDG COHESIVE 6X5 TAN ST LF (GAUZE/BANDAGES/DRESSINGS) IMPLANT
BNDG GAUZE DERMACEA FLUFF 4 (GAUZE/BANDAGES/DRESSINGS) IMPLANT
CANISTER WOUNDNEG PRESSURE 500 (CANNISTER) IMPLANT
CLEANSER WND VASHE 34 (WOUND CARE) IMPLANT
CLEANSER WND VASHE INSTL 34OZ (WOUND CARE) IMPLANT
COVER SURGICAL LIGHT HANDLE (MISCELLANEOUS) ×2 IMPLANT
DRAPE DERMATAC (DRAPES) IMPLANT
DRAPE U-SHAPE 47X51 STRL (DRAPES) ×1 IMPLANT
DRESSING PREVENA PLUS CUSTOM (GAUZE/BANDAGES/DRESSINGS) IMPLANT
DRESSING VERAFLO CLEANS CC MED (GAUZE/BANDAGES/DRESSINGS) IMPLANT
DRESSING VERAFLO CLEANSE CC LG (GAUZE/BANDAGES/DRESSINGS) IMPLANT
DRSG ADAPTIC 3X8 NADH LF (GAUZE/BANDAGES/DRESSINGS) ×1 IMPLANT
DRSG VAC PEEL AND PLACE LRG (GAUZE/BANDAGES/DRESSINGS) IMPLANT
DURAPREP 26ML APPLICATOR (WOUND CARE) ×1 IMPLANT
ELECTRODE REM PT RTRN 9FT ADLT (ELECTROSURGICAL) IMPLANT
GAUZE PAD ABD 8X10 STRL (GAUZE/BANDAGES/DRESSINGS) IMPLANT
GAUZE SPONGE 4X4 12PLY STRL (GAUZE/BANDAGES/DRESSINGS) IMPLANT
GLOVE BIOGEL PI IND STRL 9 (GLOVE) ×1 IMPLANT
GLOVE SURG ORTHO 9.0 STRL STRW (GLOVE) ×1 IMPLANT
GOWN STRL REUS W/ TWL XL LVL3 (GOWN DISPOSABLE) ×2 IMPLANT
GRAFT SKIN WND SURGICLOSE M95 (Tissue) IMPLANT
KIT BASIN OR (CUSTOM PROCEDURE TRAY) ×1 IMPLANT
KIT TURNOVER KIT B (KITS) ×1 IMPLANT
MANIFOLD NEPTUNE II (INSTRUMENTS) ×1 IMPLANT
NS IRRIG 1000ML POUR BTL (IV SOLUTION) ×1 IMPLANT
PACK ORTHO EXTREMITY (CUSTOM PROCEDURE TRAY) ×1 IMPLANT
PAD ARMBOARD POSITIONER FOAM (MISCELLANEOUS) ×2 IMPLANT
PAD NEG PRESSURE SENSATRAC (MISCELLANEOUS) IMPLANT
SET HNDPC FAN SPRY TIP SCT (DISPOSABLE) IMPLANT
STOCKINETTE IMPERVIOUS 9X36 MD (GAUZE/BANDAGES/DRESSINGS) IMPLANT
SUT ETHILON 2 0 PSLX (SUTURE) ×1 IMPLANT
SWAB COLLECTION DEVICE MRSA (MISCELLANEOUS) ×1 IMPLANT
SWAB CULTURE ESWAB REG 1ML (MISCELLANEOUS) IMPLANT
TOWEL GREEN STERILE (TOWEL DISPOSABLE) ×1 IMPLANT
TUBE CONNECTING 12X1/4 (SUCTIONS) ×1 IMPLANT
YANKAUER SUCT BULB TIP NO VENT (SUCTIONS) ×1 IMPLANT

## 2024-01-21 NOTE — Anesthesia Preprocedure Evaluation (Addendum)
 Anesthesia Evaluation  Patient identified by MRN, date of birth, ID band Patient awake    Reviewed: Allergy & Precautions, NPO status , Patient's Chart, lab work & pertinent test results  History of Anesthesia Complications Negative for: history of anesthetic complications  Airway Mallampati: III  TM Distance: >3 FB Neck ROM: Full   Comment: Previous grade II view with Miller 2, easy mask Dental  (+) Dental Advisory Given, Poor Dentition, Missing   Pulmonary neg shortness of breath, COPD, neg recent URI, Current Smoker and Patient abstained from smoking.   Pulmonary exam normal breath sounds clear to auscultation       Cardiovascular negative cardio ROS  Rhythm:Regular Rate:Normal     Neuro/Psych negative neurological ROS     GI/Hepatic ,GERD  Medicated,,(+)     substance abuse (on Subutex )  alcohol use, cocaine use and marijuana useHepatic steatosis    Endo/Other  negative endocrine ROS    Renal/GU negative Renal ROS     Musculoskeletal  (+)  narcotic dependent  Abdominal   Peds  Hematology  (+) Blood dyscrasia, anemia Lab Results      Component                Value               Date                      WBC                      8.1                 01/21/2024                HGB                      8.2 (L)             01/21/2024                HCT                      25.8 (L)            01/21/2024                MCV                      80.1                01/21/2024                PLT                      584 (H)             01/21/2024              Anesthesia Other Findings 38 y.o. with a pertinent PMH of COPD, polysubstance abuse, abuse disorder on Suboxone , alcohol use disorder without known withdrawal, and major depressive disorder, who presented with right lower extremity edema and jaundice and admitted for sepsis 2/2 right lower extermity cellulitis and fasciitis with presenting leukemoid reaction and  cholestasis of sepsis that have resolved.  Reproductive/Obstetrics                              Anesthesia  Physical Anesthesia Plan  ASA: 3  Anesthesia Plan: General   Post-op Pain Management: Tylenol  PO (pre-op)*   Induction: Intravenous  PONV Risk Score and Plan: 1 and Ondansetron , Dexamethasone  and Treatment may vary due to age or medical condition  Airway Management Planned: LMA  Additional Equipment:   Intra-op Plan:   Post-operative Plan: Extubation in OR  Informed Consent: I have reviewed the patients History and Physical, chart, labs and discussed the procedure including the risks, benefits and alternatives for the proposed anesthesia with the patient or authorized representative who has indicated his/her understanding and acceptance.     Dental advisory given  Plan Discussed with: CRNA and Anesthesiologist  Anesthesia Plan Comments: (Risks of general anesthesia discussed including, but not limited to, sore throat, hoarse voice, chipped/damaged teeth, injury to vocal cords, nausea and vomiting, allergic reactions, lung infection, heart attack, stroke, and death. All questions answered. )         Anesthesia Quick Evaluation

## 2024-01-21 NOTE — Anesthesia Postprocedure Evaluation (Signed)
 Anesthesia Post Note  Patient: Robert Eaton  Procedure(s) Performed: INCISION AND DRAINAGE OF DEEP ABSCESS, CALF (Right: Leg Lower)     Patient location during evaluation: PACU Anesthesia Type: General Level of consciousness: awake Pain management: pain level controlled Vital Signs Assessment: post-procedure vital signs reviewed and stable Respiratory status: spontaneous breathing, nonlabored ventilation and respiratory function stable Cardiovascular status: blood pressure returned to baseline and stable Postop Assessment: no apparent nausea or vomiting Anesthetic complications: no   No notable events documented.  Last Vitals:  Vitals:   01/21/24 1530 01/21/24 1607  BP: (!) 135/91 123/77  Pulse: 92 100  Resp: 14 17  Temp: 36.6 C 36.6 C  SpO2: 99% 97%    Last Pain:  Vitals:   01/21/24 1612  TempSrc:   PainSc: 8                  [name]

## 2024-01-21 NOTE — Plan of Care (Signed)
  Problem: Clinical Measurements: Goal: Ability to avoid or minimize complications of infection will improve Outcome: Progressing   Problem: Skin Integrity: Goal: Skin integrity will improve Outcome: Progressing   Problem: Education: Goal: Knowledge of General Education information will improve Description: Including pain rating scale, medication(s)/side effects and non-pharmacologic comfort measures Outcome: Progressing   Problem: Health Behavior/Discharge Planning: Goal: Ability to manage health-related needs will improve Outcome: Progressing   Problem: Clinical Measurements: Goal: Ability to maintain clinical measurements within normal limits will improve Outcome: Progressing Goal: Will remain free from infection Outcome: Progressing Goal: Diagnostic test results will improve Outcome: Progressing Goal: Respiratory complications will improve Outcome: Progressing Goal: Cardiovascular complication will be avoided Outcome: Progressing   Problem: Activity: Goal: Risk for activity intolerance will decrease Outcome: Progressing   Problem: Nutrition: Goal: Adequate nutrition will be maintained Outcome: Progressing   Problem: Coping: Goal: Level of anxiety will decrease Outcome: Progressing   Problem: Elimination: Goal: Will not experience complications related to bowel motility Outcome: Progressing Goal: Will not experience complications related to urinary retention Outcome: Progressing   Problem: Pain Managment: Goal: General experience of comfort will improve and/or be controlled Outcome: Progressing   Problem: Safety: Goal: Ability to remain free from injury will improve Outcome: Progressing   Problem: Skin Integrity: Goal: Risk for impaired skin integrity will decrease Outcome: Progressing

## 2024-01-21 NOTE — Transfer of Care (Signed)
 Immediate Anesthesia Transfer of Care Note  Patient: Robert Eaton  Procedure(s) Performed: INCISION AND DRAINAGE OF DEEP ABSCESS, CALF (Right: Leg Lower)  Patient Location: PACU  Anesthesia Type:General  Level of Consciousness: awake and alert   Airway & Oxygen Therapy: Patient Spontanous Breathing  Post-op Assessment: Report given to RN  Post vital signs: Reviewed and stable  Last Vitals:  Vitals Value Taken Time  BP 135/92 01/21/24 14:00  Temp    Pulse 103 01/21/24 14:04  Resp 17 01/21/24 14:04  SpO2 95 % 01/21/24 14:04  Vitals shown include unfiled device data.  Last Pain:  Vitals:   01/21/24 1118  TempSrc:   PainSc: 4       Patients Stated Pain Goal: 2 (01/21/24 1118)  Complications: No notable events documented.

## 2024-01-21 NOTE — Progress Notes (Signed)
 HD#14 SUBJECTIVE:  Patient Summary: Robert Eaton is a 38 y.o. with a pertinent PMH of COPD, polysubstance abuse, abuse disorder on Suboxone , alcohol use disorder without known withdrawal, and major depressive disorder, who presented with right lower extremity edema and jaundice and admitted for sepsis 2/2 right lower extermity cellulitis and fasciitis with presenting leukemoid reaction and cholestasis of sepsis that have resolved.  Overnight Events: None  Interim History: Patient to be taken to OR today for final incision and drainage. He reports no increased pain. He has been able to do his PT, and is having normal bowel movements. He is NPO and ready for his surgery.    OBJECTIVE:  Vital Signs: Vitals:   01/20/24 2011 01/21/24 0406 01/21/24 0500 01/21/24 0800  BP: 103/70 96/67  95/62  Pulse: 88 88  80  Resp: 18 18  18   Temp: 98.1 F (36.7 C) 97.9 F (36.6 C)  97.8 F (36.6 C)  TempSrc: Oral Oral  Oral  SpO2: 96% 100%  95%  Weight:   81.9 kg   Height:       Supplemental O2: Room Air SpO2: 95 % O2 Flow Rate (L/min): 2 L/min  Filed Weights   01/14/24 1319 01/20/24 0500 01/21/24 0500  Weight: 84.6 kg 85.3 kg 81.9 kg     Intake/Output Summary (Last 24 hours) at 01/21/2024 0930 Last data filed at 01/21/2024 0319 Gross per 24 hour  Intake --  Output 1775 ml  Net -1775 ml   Net IO Since Admission: -27,956.98 mL [01/21/24 0930]  Physical Exam: Constitutional: Well-appearing adult male. In no acute distress. Pulm:Normal work of breathing on room air. MSK: Right lower extremity wrapped in gauze and Ace wrap from the foot to the knee.  No ascending erythema or warmth about the knee    Pertinent labs and imaging:      Latest Ref Rng & Units 01/21/2024    5:01 AM 01/20/2024    6:42 AM 01/19/2024    3:30 AM  CBC  WBC 4.0 - 10.5 K/uL 8.1  7.3  8.3   Hemoglobin 13.0 - 17.0 g/dL 8.2  7.7  7.5   Hematocrit 39.0 - 52.0 % 25.8  24.4  23.8   Platelets 150 - 400 K/uL 584  645   632        Latest Ref Rng & Units 01/21/2024    5:01 AM 01/16/2024    4:37 AM 01/15/2024    6:41 AM  CMP  Glucose 70 - 99 mg/dL 95  893  886   BUN 6 - 20 mg/dL 9  6  6    Creatinine 0.61 - 1.24 mg/dL 9.24  9.13  9.10   Sodium 135 - 145 mmol/L 135  138  131   Potassium 3.5 - 5.1 mmol/L 3.6  4.6  4.6   Chloride 98 - 111 mmol/L 106  103  101   CO2 22 - 32 mmol/L 23  27  24    Calcium 8.9 - 10.3 mg/dL 8.6  8.3  7.9   Total Protein 6.5 - 8.1 g/dL  6.7  7.0   Total Bilirubin 0.0 - 1.2 mg/dL  0.7  0.8   Alkaline Phos 38 - 126 U/L  92  104   AST 15 - 41 U/L  25  33   ALT 0 - 44 U/L  28  35     No results found.    ASSESSMENT/PLAN:  Assessment: Principal Problem:   Fasciitis Active Problems:   Polysubstance  abuse (HCC)   Alcohol use disorder   Opioid use disorder   Cellulitis of right lower extremity   Sepsis (HCC)   Jaundice   Hyperbilirubinemia   Hyponatremia   Hepatic steatosis   Severe protein-calorie malnutrition (HCC)   Necrotizing fasciitis due to Streptococcus pyogenes (HCC)   Hypokalemia  Robert Eaton is a 38 y.o. with a pertinent PMH of COPD, polysubstance abuse, abuse disorder on Suboxone , alcohol use disorder without known withdrawal, and major depressive disorder, who presented with right lower extremity edema and jaundice and admitted for sepsis 2/2 right lower extermity cellulitis with hyperbilirubinemia and severe leukocytosis.   Plan: RLE cellulitis/fasciitis Group A strep Continues to be stable without return of fever or increasing pain.  No signs of ascending infection.  Completed 7 days of linezolid  on 8/7.  Proceeding to OR today. Will need a portable Prevena pump for discharge after drainage has stabilized. Will advance diet post-op. Plan to replenish iron  after I&D and completion of IV antibiotics. Will give IV iron  later this week. - Continue IV Unasyn , day 15 - Oxycodone  20 mg every 4 hours as needed - Dilaudid  0.5 mg IV every 2 hours as needed for  breakthrough pain - Schedule Tylenol  1000 mg every 8 hours and 3 days of Robaxin  1500 mg every 6 hours - Could add gabapentin next -- IV iron  for tomorrow pending lab results  Microcytic anemia In setting of acute infection and multiple surgeries hemoglobin dropped after admission but has remained stable between 7-8. Holding VTE prophylaxis per orthopedic surgery. - Continue daily CBC  Opiate Use Disorder Holding suboxone  dose 8-2mg  TID as above.  Will resume starting tomorrow to readjust chronic pain management.  Sepsis induced cholestasis, resolved Hyponatremia, resolved  Iliac and inguinal adenopathy Seen on admission CT and noted to be concerning for malignancy but more likely associated with acute infection.  He will follow-up CT before discharge.   COPD Continue albuterol  PRN.  Best Practice: Diet: Regular diet IVF: None VTE: SCDs Start: 01/14/24 1646 SCDs Start: 01/09/24 0716 Code: Full  Disposition planning: Therapy Recs: None, DME: none Family Contact: dad DISPO: Anticipated discharge pending results of I&D surgery  Signature: Kaitlinn Iversen, DO Internal Medicine Resident, PGY-1 Please contact the on call pager at [phone] for any urgent or emergent needs. 9:30 AM 01/21/2024

## 2024-01-21 NOTE — Interval H&P Note (Signed)
 History and Physical Interval Note:  01/21/2024 7:18 AM  Robert Eaton  has presented today for surgery, with the diagnosis of ABSCESS RIGHT LEG.  The various methods of treatment have been discussed with the patient and family. After consideration of risks, benefits and other options for treatment, the patient has consented to  Procedure(s): INCISION AND DRAINAGE OF DEEP ABSCESS, CALF (Right) as a surgical intervention.  The patient's history has been reviewed, patient examined, no change in status, stable for surgery.  I have reviewed the patient's chart and labs.  Questions were answered to the patient's satisfaction.     Jessyca Sloan V Renuka Farfan

## 2024-01-21 NOTE — Anesthesia Procedure Notes (Signed)
 Procedure Name: LMA Insertion Date/Time: 01/21/2024 1:14 PM  Performed by: Delores Duwaine SAUNDERS, CRNAPre-anesthesia Checklist: Patient identified, Emergency Drugs available, Suction available and Patient being monitored Patient Re-evaluated:Patient Re-evaluated prior to induction Oxygen Delivery Method: Circle System Utilized Preoxygenation: Pre-oxygenation with 100% oxygen Induction Type: IV induction Ventilation: Mask ventilation without difficulty LMA: LMA inserted LMA Size: 5.0 Number of attempts: 1 Airway Equipment and Method: Bite block Placement Confirmation: positive ETCO2 Tube secured with: Tape Dental Injury: Teeth and Oropharynx as per pre-operative assessment

## 2024-01-21 NOTE — Op Note (Signed)
 01/21/2024  1:56 PM  PATIENT:  Robert Eaton    PRE-OPERATIVE DIAGNOSIS: Necrotizing fasciitis right leg  POST-OPERATIVE DIAGNOSIS:  Same  PROCEDURE: Excisional debridement right calf with excision of skin soft tissue muscle and fascia. Local tissue transfer for wound closure 45 x 15 cm. Application of Kerecis micro graft 95 cm to cover wound surface area of 500 cm. Application of cleanse choice wound VAC sponge x 1 and application of peel in place wound VAC sponges x 2.  SURGEON:  Jerona LULLA Sage, MD  PHYSICIAN ASSISTANT:None ANESTHESIA:   General  PREOPERATIVE INDICATIONS:  [name] is a  38 y.o. male with a diagnosis of ABSCESS RIGHT LEG who failed conservative measures and elected for surgical management.    The risks benefits and alternatives were discussed with the patient preoperatively including but not limited to the risks of infection, bleeding, nerve injury, cardiopulmonary complications, the need for revision surgery, among others, and the patient was willing to proceed.  OPERATIVE IMPLANTS:   Implant Name Type Inv. Item Serial No. Manufacturer Lot No. LRB No. Used Action  GRAFT SKIN WND SURGICLOSE M95 - [id number] Tissue GRAFT SKIN WND SURGICLOSE M95  [employer] [id number] Right 1 Implanted    @ENCIMAGES @  OPERATIVE FINDINGS: Muscle had good color and contractility.  There is no signs of residual infection.  OPERATIVE PROCEDURE: Patient was brought the operating room and underwent a general anesthetic.  After adequate levels anesthesia were obtained patient's right lower extremity was prepped using DuraPrep draped into a sterile field a timeout was called.  A 21 blade knife and rondure were used to excise skin soft tissue muscle and fascia.  The remaining muscle had good color and contractility.  There was no purulence no drainage no signs of infection.  The wound was irrigated with Vashe.  The wound bed was then covered with 95 cm of Kerecis micro graft to  cover wound surface area greater than 500 cm.  The cleanse choice wound VAC sponge was placed and the tissue margins were undermined to allow for local tissue transfer over the wound VAC sponge.  Wound surface closure 45 x 15 cm.  The cleanse choice sponge was then covered with the peel in place wound VAC sponges x 2 this is secured with derma tack and Coban.  This had a good suction fit patient was extubated taken the PACU in stable condition.   DISCHARGE PLANNING:  Antibiotic duration: Continue antibiotics for at least 3 weeks based on tissue cultures  Weightbearing: Weightbearing as tolerated  Pain medication: Opioid pathway  Dressing care/ Wound VAC: Continue wound VAC until discharge and then remove wound VAC sponges and start Vashe dressing changes at discharge  Ambulatory devices: Walker or crutches  Discharge to: Anticipate discharge back to patient's state facility.  Follow-up: In the office 1 week post operative.

## 2024-01-21 NOTE — Progress Notes (Signed)
 PT Cancellation Note  Patient Details Name: Robert Eaton MRN: [id number] DOB: [birth year]   Cancelled Treatment:    Reason Eval/Treat Not Completed: Patient at procedure or test/unavailable (plan for repeat debridement)  Aleck Daring, PT, DPT Acute Rehabilitation Services Office [phone]    [name] 01/21/2024, 10:50 AM

## 2024-01-21 NOTE — TOC Progression Note (Addendum)
 Transition of Care [facility]) - Progression Note    Patient Details  Name: Norrin Shreffler MRN: [id number] Date of Birth: 02-02-1986  Transition of Care [facility]) CM/SW Contact  Lauraine FORBES Saa, LCSWA Phone Number: 01/21/2024, 2:04 PM  Clinical Narrative:     2:04 PM CSW informed [facility] medical clinic of patient plan to return with Preveena wound Vac. Clinic confirmed they are able to accommodate that wound Vac and patient would be able to return to outpatient clinic for removal. CSW relayed information to medical team. Per progressions, patient received debridement in OR today. Orthopedics Surgeon informed medical team that wound VAC is to remain for one week and if patient discharges after one week then the wound VAC can be removed and Vashe dressing changes started, but if patient discharges with the wound VAC it would be the Prevena plus portable wound VAC pump that is disposable. TOC will continue to follow and be available to assist.  Expected Discharge Plan:  [facility]) Barriers to Discharge: Continued Medical Work up               Expected Discharge Plan and Services       Living arrangements for the past 2 months:  ([facility])                                       Social Drivers of Health (SDOH) Interventions SDOH Screenings   Food Insecurity: Patient Declined (01/15/2024)  Housing: High Risk (01/15/2024)  Transportation Needs: No Transportation Needs (01/15/2024)  Utilities: Not At Risk (01/08/2024)  Alcohol Screen: Low Risk  (09/18/2023)  Depression (PHQ2-9): Medium Risk (09/18/2023)  Tobacco Use: High Risk (01/21/2024)    Readmission Risk Interventions     No data to display

## 2024-01-21 NOTE — Progress Notes (Signed)
 Called to get report on patient for his surgery. RN in another patient's room. Asked for her to call short stay @ [phone]. Transport sent for patient.

## 2024-01-22 ENCOUNTER — Encounter (HOSPITAL_COMMUNITY): Payer: Self-pay | Admitting: Orthopedic Surgery

## 2024-01-22 LAB — CBC
HCT: 23.3 % — ABNORMAL LOW (ref 39.0–52.0)
Hemoglobin: 7.1 g/dL — ABNORMAL LOW (ref 13.0–17.0)
MCH: 24.7 pg — ABNORMAL LOW (ref 26.0–34.0)
MCHC: 30.5 g/dL (ref 30.0–36.0)
MCV: 81.2 fL (ref 80.0–100.0)
Platelets: 560 K/uL — ABNORMAL HIGH (ref 150–400)
RBC: 2.87 MIL/uL — ABNORMAL LOW (ref 4.22–5.81)
RDW: 19.4 % — ABNORMAL HIGH (ref 11.5–15.5)
WBC: 11.5 K/uL — ABNORMAL HIGH (ref 4.0–10.5)
nRBC: 0 % (ref 0.0–0.2)

## 2024-01-22 LAB — TYPE AND SCREEN
ABO/RH(D): A POS
Antibody Screen: NEGATIVE

## 2024-01-22 LAB — IRON AND TIBC
Iron: 17 ug/dL — ABNORMAL LOW (ref 45–182)
Saturation Ratios: 5 % — ABNORMAL LOW (ref 17.9–39.5)
TIBC: 322 ug/dL (ref 250–450)
UIBC: 305 ug/dL

## 2024-01-22 LAB — FERRITIN: Ferritin: 81 ng/mL (ref 24–336)

## 2024-01-22 LAB — ABO/RH: ABO/RH(D): A POS

## 2024-01-22 MED ORDER — AMOXICILLIN-POT CLAVULANATE 875-125 MG PO TABS
1.0000 | ORAL_TABLET | Freq: Two times a day (BID) | ORAL | Status: DC
  Administered 2024-01-22 – 2024-01-26 (×9): 1 via ORAL
  Filled 2024-01-22 (×9): qty 1

## 2024-01-22 MED ORDER — ALUM & MAG HYDROXIDE-SIMETH 200-200-20 MG/5ML PO SUSP
30.0000 mL | Freq: Once | ORAL | Status: AC
  Administered 2024-01-22: 30 mL via ORAL
  Filled 2024-01-22: qty 30

## 2024-01-22 MED ORDER — IRON SUCROSE 200 MG IVPB - SIMPLE MED
200.0000 mg | Freq: Once | Status: AC
  Administered 2024-01-22: 200 mg via INTRAVENOUS
  Filled 2024-01-22: qty 200
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
## 2024-01-22 NOTE — Plan of Care (Signed)
 Frequently requiring breakthrough IV pain medication throughout shift, in addition to multiple PRN 20mg  oxycodone  doses. Wound vac patent to suction with no leaking overnight. IV antibiotics infusing per order. Vitals stable. Voiding spontaneously.   Problem: Clinical Measurements: Goal: Ability to avoid or minimize complications of infection will improve Outcome: Progressing   Problem: Skin Integrity: Goal: Skin integrity will improve Outcome: Progressing   Problem: Education: Goal: Knowledge of General Education information will improve Description: Including pain rating scale, medication(s)/side effects and non-pharmacologic comfort measures Outcome: Progressing   Problem: Health Behavior/Discharge Planning: Goal: Ability to manage health-related needs will improve Outcome: Progressing   Problem: Clinical Measurements: Goal: Ability to maintain clinical measurements within normal limits will improve Outcome: Progressing Goal: Will remain free from infection Outcome: Progressing Goal: Diagnostic test results will improve Outcome: Progressing Goal: Respiratory complications will improve Outcome: Progressing Goal: Cardiovascular complication will be avoided Outcome: Progressing   Problem: Activity: Goal: Risk for activity intolerance will decrease Outcome: Progressing   Problem: Nutrition: Goal: Adequate nutrition will be maintained Outcome: Progressing   Problem: Coping: Goal: Level of anxiety will decrease Outcome: Progressing   Problem: Elimination: Goal: Will not experience complications related to bowel motility Outcome: Progressing Goal: Will not experience complications related to urinary retention Outcome: Progressing   Problem: Pain Managment: Goal: General experience of comfort will improve and/or be controlled Outcome: Progressing   Problem: Safety: Goal: Ability to remain free from injury will improve Outcome: Progressing   Problem: Skin  Integrity: Goal: Risk for impaired skin integrity will decrease Outcome: Progressing

## 2024-01-22 NOTE — TOC Progression Note (Signed)
 Transition of Care [facility]) - Progression Note    Patient Details  Name: Robert Eaton MRN: [id number] Date of Birth: 1986/04/20  Transition of Care [facility]) CM/SW Contact  Lauraine FORBES Saa, LCSWA Phone Number: 01/22/2024, 12:15 PM  Clinical Narrative:     12:15 PM Per progressions, patient is expected to discharge tomorrow. CSW informed [facility] of patient's expected discharge date and needs (wound VAC is to remain for one week and if patient discharges after one week then the wound VAC can be removed and Vashe dressing changes started, but if patient discharges with the wound VAC it would be the Prevena plus portable wound VAC pump that is disposable). [facility] confirmed leadership approval of Prevena wound VAC and inquired about dressing change frequency upon would [facility] outpatient. CSW stated that dressing change instructions would be on patient's discharge summary. Medical Clinic confirmed patient's discharge summary could be faxed [phone]) and that they do not remove wound VAC's themselves. CSW relayed above information to medical team.  Expected Discharge Plan:  [facility] Barriers to Discharge: Continued Medical Work up               Expected Discharge Plan and Services       Living arrangements for the past 2 months:  ([facility])                                       Social Drivers of Health (SDOH) Interventions SDOH Screenings   Food Insecurity: Patient Declined (01/15/2024)  Housing: High Risk (01/15/2024)  Transportation Needs: No Transportation Needs (01/15/2024)  Utilities: Not At Risk (01/08/2024)  Alcohol Screen: Low Risk  (09/18/2023)  Depression (PHQ2-9): Medium Risk (09/18/2023)  Tobacco Use: High Risk (01/21/2024)    Readmission Risk Interventions     No data to display

## 2024-01-22 NOTE — Plan of Care (Signed)
  Problem: Clinical Measurements: Goal: Ability to avoid or minimize complications of infection will improve Outcome: Progressing   Problem: Skin Integrity: Goal: Skin integrity will improve Outcome: Progressing   Problem: Education: Goal: Knowledge of General Education information will improve Description: Including pain rating scale, medication(s)/side effects and non-pharmacologic comfort measures Outcome: Progressing   Problem: Health Behavior/Discharge Planning: Goal: Ability to manage health-related needs will improve Outcome: Progressing   Problem: Clinical Measurements: Goal: Ability to maintain clinical measurements within normal limits will improve Outcome: Progressing Goal: Will remain free from infection Outcome: Progressing Goal: Diagnostic test results will improve Outcome: Progressing Goal: Respiratory complications will improve Outcome: Progressing Goal: Cardiovascular complication will be avoided Outcome: Progressing   Problem: Activity: Goal: Risk for activity intolerance will decrease Outcome: Progressing   Problem: Nutrition: Goal: Adequate nutrition will be maintained Outcome: Progressing   Problem: Coping: Goal: Level of anxiety will decrease Outcome: Progressing   Problem: Elimination: Goal: Will not experience complications related to bowel motility Outcome: Progressing Goal: Will not experience complications related to urinary retention Outcome: Progressing   Problem: Pain Managment: Goal: General experience of comfort will improve and/or be controlled Outcome: Progressing   Problem: Safety: Goal: Ability to remain free from injury will improve Outcome: Progressing   Problem: Skin Integrity: Goal: Risk for impaired skin integrity will decrease Outcome: Progressing

## 2024-01-22 NOTE — Progress Notes (Signed)
 HD#15 SUBJECTIVE:  Patient Summary: Robert Eaton is a 38 y.o. with a pertinent PMH of COPD, polysubstance abuse, abuse disorder on Suboxone , alcohol use disorder without known withdrawal, and major depressive disorder, who presented with right lower extremity edema and jaundice and admitted for sepsis 2/2 right lower extermity cellulitis and fasciitis with presenting leukemoid reaction and cholestasis of sepsis that have resolved.  Overnight Events: None  Interim History: Patient evaluated at bedside. His leg is sore today, and believes it to be the most sore he's felt. He has had an appetite and reports a small bowel movement yesterday after surgery. Denies abdominal pain, headache, or signs of bleeding.    OBJECTIVE:  Vital Signs: Vitals:   01/21/24 2119 01/22/24 0510 01/22/24 0511 01/22/24 0759  BP: 135/79 103/66  92/63  Pulse: (!) 105 93  79  Resp: 19 19    Temp: 97.6 F (36.4 C) 98.1 F (36.7 C)  98.1 F (36.7 C)  TempSrc:  Oral  Oral  SpO2: 95% 97%  98%  Weight:   82 kg   Height:       Supplemental O2: Room Air SpO2: 98 % O2 Flow Rate (L/min): 2 L/min  Filed Weights   01/21/24 0500 01/21/24 1103 01/22/24 0511  Weight: 81.9 kg 81.9 kg 82 kg     Intake/Output Summary (Last 24 hours) at 01/22/2024 1025 Last data filed at 01/22/2024 0000 Gross per 24 hour  Intake 1150 ml  Output 1130 ml  Net 20 ml   Net IO Since Admission: -27,936.98 mL [01/22/24 1025]  Physical Exam: Constitutional: Well-appearing adult male. In no acute distress. Pulm:Normal work of breathing on room air. MSK: Right lower extremity wrapped in gauze and Ace wrap from the foot to the knee.  No ascending erythema or warmth about the knee    Pertinent labs and imaging:      Latest Ref Rng & Units 01/22/2024    5:55 AM 01/21/2024    5:01 AM 01/20/2024    6:42 AM  CBC  WBC 4.0 - 10.5 K/uL 11.5  8.1  7.3   Hemoglobin 13.0 - 17.0 g/dL 7.1  8.2  7.7   Hematocrit 39.0 - 52.0 % 23.3  25.8  24.4    Platelets 150 - 400 K/uL 560  584  645        Latest Ref Rng & Units 01/21/2024    5:01 AM 01/16/2024    4:37 AM 01/15/2024    6:41 AM  CMP  Glucose 70 - 99 mg/dL 95  893  886   BUN 6 - 20 mg/dL 9  6  6    Creatinine 0.61 - 1.24 mg/dL 9.24  9.13  9.10   Sodium 135 - 145 mmol/L 135  138  131   Potassium 3.5 - 5.1 mmol/L 3.6  4.6  4.6   Chloride 98 - 111 mmol/L 106  103  101   CO2 22 - 32 mmol/L 23  27  24    Calcium 8.9 - 10.3 mg/dL 8.6  8.3  7.9   Total Protein 6.5 - 8.1 g/dL  6.7  7.0   Total Bilirubin 0.0 - 1.2 mg/dL  0.7  0.8   Alkaline Phos 38 - 126 U/L  92  104   AST 15 - 41 U/L  25  33   ALT 0 - 44 U/L  28  35     No results found.    ASSESSMENT/PLAN:  Assessment: Principal Problem:   Fasciitis Active  Problems:   Polysubstance abuse (HCC)   Alcohol use disorder   Opioid use disorder   Cellulitis of right lower extremity   Sepsis (HCC)   Jaundice   Hyperbilirubinemia   Hyponatremia   Hepatic steatosis   Severe protein-calorie malnutrition (HCC)   Necrotizing fasciitis due to Streptococcus pyogenes (HCC)   Hypokalemia  Robert Eaton is a 38 y.o. with a pertinent PMH of COPD, polysubstance abuse, abuse disorder on Suboxone , alcohol use disorder without known withdrawal, and major depressive disorder, who presented with right lower extremity edema and jaundice and admitted for sepsis 2/2 right lower extermity cellulitis with hyperbilirubinemia and severe leukocytosis.   Plan: RLE cellulitis/fasciitis Group A strep Final incision and drainage done 8/13. Orthopedics recommends continued antibiotics for 3 weeks based on initial I&D on 7/31. Will stop IV Unasyn  and switch to PO Augmentin  for 7 days. Will need a portable Prevena pump for discharge after drainage has stabilized. Iron  panel shows total iron  of 17, saturation of 5%, and ferritin of 81. Will replenish iron  given multiple I&Ds and IV antibiotics.  - Start Augmentin  BID for 7 days.  -- IV Iron   - Oxycodone  20  mg every 4 hours as needed -- Will need to restart Suboxone  dose back into patient's pain management - Dilaudid  0.5 mg IV every 2 hours as needed for breakthrough pain - Schedule Tylenol  1000 mg every 8 hours and 3 days of Robaxin  1500 mg every 6 hours   Microcytic anemia In setting of acute infection and multiple surgeries hemoglobin dropped after admission but has remained stable between 7-8. Holding VTE prophylaxis per orthopedic surgery. Hgb today 7.1 likely due to surgery yesterday. IV iron  was ordered for IDA that will be given today. Will order type and screen in case patient needs additional transfusion after receiving iron .  - Continue daily CBC -- Type and Screen  Opiate Use Disorder Holding suboxone  dose 8-2mg  TID as above.  Will resume starting tomorrow to readjust chronic pain management.  Sepsis induced cholestasis, resolved Hyponatremia, resolved  Iliac and inguinal adenopathy Seen on admission CT and noted to be concerning for malignancy but more likely associated with acute infection.  He will follow-up CT before discharge.   COPD Continue albuterol  PRN.  Best Practice: Diet: Regular diet IVF: None VTE: SCDs Start: 01/14/24 1646 SCDs Start: 01/09/24 0716 Code: Full  Disposition planning: Therapy Recs: None, DME: none Family Contact: dad DISPO: Anticipated discharge pending results of I&D surgery  Signature: Robert Orndoff, DO Internal Medicine Resident, PGY-1 Please contact the on call pager at [phone] for any urgent or emergent needs. 10:25 AM 01/22/2024

## 2024-01-22 NOTE — Progress Notes (Addendum)
 [facility]    Vac with good suction 100 cc drainage in canister bloody cultures showing group A strep. Currently on Unasyn  IV coverage.    A/P: Necrotizing fasciitis right leg s/p  PROCEDURE: Excisional debridement right calf with excision of skin soft tissue muscle and fascia. Local tissue transfer for wound closure 45 x 15 cm. Application of Kerecis micro graft 95 cm to cover wound surface area of 500 cm. Application of cleanse choice wound VAC sponge x 1 and application of peel in place wound VAC sponges x 2.  Vac will be maintained for 1 week.  If D/C'd will change to prevena vac.  We would like to wait until 50 ml or less in the vac canister over a 24 hour period before discharge.  If suction lost will change to Vashe wet to dry dressing changes daily.   Antibiotic coverage for 3 weeks from 01/08/24. F/U in week.  HGB 7.1 will defer primary team.  He was asymptomatic while sitting up in bed.    Maurilio Deland Collet PA-C

## 2024-01-23 LAB — CBC
HCT: 23.2 % — ABNORMAL LOW (ref 39.0–52.0)
Hemoglobin: 7.4 g/dL — ABNORMAL LOW (ref 13.0–17.0)
MCH: 25.5 pg — ABNORMAL LOW (ref 26.0–34.0)
MCHC: 31.9 g/dL (ref 30.0–36.0)
MCV: 80 fL (ref 80.0–100.0)
Platelets: 479 K/uL — ABNORMAL HIGH (ref 150–400)
RBC: 2.9 MIL/uL — ABNORMAL LOW (ref 4.22–5.81)
RDW: 18.7 % — ABNORMAL HIGH (ref 11.5–15.5)
WBC: 10.4 K/uL (ref 4.0–10.5)
nRBC: 0 % (ref 0.0–0.2)

## 2024-01-23 MED ORDER — BUPRENORPHINE HCL-NALOXONE HCL 2-0.5 MG SL SUBL
2.0000 | SUBLINGUAL_TABLET | Freq: Three times a day (TID) | SUBLINGUAL | Status: DC
  Administered 2024-01-24 (×2): 2 via SUBLINGUAL
  Filled 2024-01-23 (×2): qty 2

## 2024-01-23 MED ORDER — IRON SUCROSE 200 MG IVPB - SIMPLE MED
200.0000 mg | Freq: Once | Status: AC
  Administered 2024-01-23: 200 mg via INTRAVENOUS
  Filled 2024-01-23: qty 200

## 2024-01-23 MED ORDER — HYDROMORPHONE HCL 1 MG/ML IJ SOLN
0.5000 mg | INTRAMUSCULAR | Status: AC | PRN
  Administered 2024-01-23 – 2024-01-24 (×4): 0.5 mg via INTRAVENOUS
  Filled 2024-01-23 (×5): qty 0.5

## 2024-01-23 MED ORDER — OXYCODONE HCL 5 MG PO TABS
20.0000 mg | ORAL_TABLET | ORAL | Status: AC | PRN
  Administered 2024-01-23 – 2024-01-24 (×3): 20 mg via ORAL
  Filled 2024-01-23 (×3): qty 4

## 2024-01-23 NOTE — TOC Progression Note (Signed)
 Transition of Care [facility]) - Progression Note    Patient Details  Name: Robert Eaton MRN: [id number] Date of Birth: 05/02/1986  [facility]) CM/SW Contact  Lauraine FORBES Saa, LCSWA Phone Number: 01/23/2024, 9:51 AM  Clinical Narrative:     9:52 AM Per Resident MD, patient is not discharging today as his wound VAC is still having a good amount of output and hemoglobin is stabilizing but slowly. Patient is expected to discharge tomorrow. TOC will continue to follow and be available to assist.  Expected Discharge Plan:  [facility]) Barriers to Discharge: Continued Medical Work up               Expected Discharge Plan and Services       Living arrangements for the past 2 months:  ([facility])                                       Social Drivers of Health (SDOH) Interventions SDOH Screenings   Food Insecurity: Patient Declined (01/15/2024)  Housing: High Risk (01/15/2024)  Transportation Needs: No Transportation Needs (01/15/2024)  Utilities: Not At Risk (01/08/2024)  Alcohol Screen: Low Risk  (09/18/2023)  Depression (PHQ2-9): Medium Risk (09/18/2023)  Tobacco Use: High Risk (01/21/2024)    Readmission Risk Interventions     No data to display

## 2024-01-23 NOTE — Plan of Care (Signed)
  Problem: Clinical Measurements: Goal: Ability to avoid or minimize complications of infection will improve Outcome: Progressing   Problem: Skin Integrity: Goal: Skin integrity will improve Outcome: Progressing   Problem: Education: Goal: Knowledge of General Education information will improve Description: Including pain rating scale, medication(s)/side effects and non-pharmacologic comfort measures Outcome: Progressing   Problem: Health Behavior/Discharge Planning: Goal: Ability to manage health-related needs will improve Outcome: Progressing   Problem: Clinical Measurements: Goal: Ability to maintain clinical measurements within normal limits will improve Outcome: Progressing Goal: Will remain free from infection Outcome: Progressing Goal: Diagnostic test results will improve Outcome: Progressing Goal: Respiratory complications will improve Outcome: Progressing Goal: Cardiovascular complication will be avoided Outcome: Progressing   Problem: Activity: Goal: Risk for activity intolerance will decrease Outcome: Progressing   Problem: Nutrition: Goal: Adequate nutrition will be maintained Outcome: Progressing   Problem: Coping: Goal: Level of anxiety will decrease Outcome: Progressing   Problem: Elimination: Goal: Will not experience complications related to bowel motility Outcome: Progressing Goal: Will not experience complications related to urinary retention Outcome: Progressing   Problem: Pain Managment: Goal: General experience of comfort will improve and/or be controlled Outcome: Progressing   Problem: Safety: Goal: Ability to remain free from injury will improve Outcome: Progressing   Problem: Skin Integrity: Goal: Risk for impaired skin integrity will decrease Outcome: Progressing

## 2024-01-23 NOTE — Progress Notes (Signed)
 Patient ID: Robert Eaton, male   DOB: 05/12/1986, 38 y.o.   MRN: [id number]   Vac with good suction 250 cc drainage in canister serosanguinous cultures showing group A strep. Currently on Unasyn  IV coverage.    A/P: Necrotizing fasciitis right leg s/p  PROCEDURE: Excisional debridement right calf with excision of skin soft tissue muscle and fascia. Local tissue transfer for wound closure 45 x 15 cm. Application of Kerecis micro graft 95 cm to cover wound surface area of 500 cm. Application of cleanse choice wound VAC sponge x 1 and application of peel in place wound VAC sponges x 2.  Vac will be maintained for 1 week following discharge.  When D/C'd will change to prevena vac. Have reinforced vac dressing with Ioban this am.  If suction lost will change to Vashe wet to dry dressing changes daily.    Antibiotic coverage for 3 weeks from 01/08/24. F/U in office next friday.  Discharge per primary team  Rocky JONELLE Hans, NP [phone]

## 2024-01-23 NOTE — Progress Notes (Addendum)
 HD#16 SUBJECTIVE:  Patient Summary: Robert Eaton is a 38 y.o. with a pertinent PMH of COPD, polysubstance abuse, abuse disorder on Suboxone , alcohol use disorder without known withdrawal, and major depressive disorder, who presented with right lower extremity edema and jaundice and admitted for sepsis 2/2 right lower extermity cellulitis and fasciitis with presenting leukemoid reaction and cholestasis of sepsis that have resolved.  Overnight Events: None  Interim History: Patient evaluated at bedside. He reports his pain is still occurring, and feels like a throbbing pain most of the time. Occasionally will be intermittently sharp, especially with movement on the incision. He reports a good appetite, and has had bowel movements as of this morning. His wound vacuum has good output.    OBJECTIVE:  Vital Signs: Vitals:   01/22/24 2010 01/23/24 0431 01/23/24 0439 01/23/24 0745  BP: 107/75 111/71  110/67  Pulse: 88 100  (!) 110  Resp: 18 19    Temp: (!) 97.5 F (36.4 C) 99 F (37.2 C)  98.1 F (36.7 C)  TempSrc: Oral Oral  Oral  SpO2: 99% 98%  97%  Weight:   83.1 kg   Height:       Supplemental O2: Room Air SpO2: 97 % O2 Flow Rate (L/min): 2 L/min  Filed Weights   01/21/24 1103 01/22/24 0511 01/23/24 0439  Weight: 81.9 kg 82 kg 83.1 kg     Intake/Output Summary (Last 24 hours) at 01/23/2024 9057 Last data filed at 01/23/2024 0600 Gross per 24 hour  Intake 820 ml  Output 3240 ml  Net -2420 ml   Net IO Since Admission: -30,356.98 mL [01/23/24 0942]  Physical Exam: Constitutional: Well-appearing adult male. In no acute distress. Pulm:Normal work of breathing on room air. MSK: Right lower extremity wrapped in gauze and Ace wrap from the foot to the knee.  No ascending erythema or warmth about the knee    Pertinent labs and imaging:      Latest Ref Rng & Units 01/23/2024    4:33 AM 01/22/2024    5:55 AM 01/21/2024    5:01 AM  CBC  WBC 4.0 - 10.5 K/uL 10.4  11.5  8.1    Hemoglobin 13.0 - 17.0 g/dL 7.4  7.1  8.2   Hematocrit 39.0 - 52.0 % 23.2  23.3  25.8   Platelets 150 - 400 K/uL 479  560  584        Latest Ref Rng & Units 01/21/2024    5:01 AM 01/16/2024    4:37 AM 01/15/2024    6:41 AM  CMP  Glucose 70 - 99 mg/dL 95  893  886   BUN 6 - 20 mg/dL 9  6  6    Creatinine 0.61 - 1.24 mg/dL 9.24  9.13  9.10   Sodium 135 - 145 mmol/L 135  138  131   Potassium 3.5 - 5.1 mmol/L 3.6  4.6  4.6   Chloride 98 - 111 mmol/L 106  103  101   CO2 22 - 32 mmol/L 23  27  24    Calcium 8.9 - 10.3 mg/dL 8.6  8.3  7.9   Total Protein 6.5 - 8.1 g/dL  6.7  7.0   Total Bilirubin 0.0 - 1.2 mg/dL  0.7  0.8   Alkaline Phos 38 - 126 U/L  92  104   AST 15 - 41 U/L  25  33   ALT 0 - 44 U/L  28  35     No results found.  ASSESSMENT/PLAN:  Assessment: Principal Problem:   Fasciitis Active Problems:   Polysubstance abuse (HCC)   Alcohol use disorder   Opioid use disorder   Cellulitis of right lower extremity   Sepsis (HCC)   Jaundice   Hyperbilirubinemia   Hyponatremia   Hepatic steatosis   Severe protein-calorie malnutrition (HCC)   Necrotizing fasciitis due to Streptococcus pyogenes (HCC)   Hypokalemia  Robert Eaton is a 38 y.o. with a pertinent PMH of COPD, polysubstance abuse, abuse disorder on Suboxone , alcohol use disorder without known withdrawal, and major depressive disorder, who presented with right lower extremity edema and jaundice and admitted for sepsis 2/2 right lower extermity cellulitis with hyperbilirubinemia and severe leukocytosis.   Plan: RLE cellulitis/fasciitis Group A strep Final incision and drainage done 8/13. Orthopedics recommends continued antibiotics for 3 weeks based on initial I&D on 7/31. On PO Augmentin  for 7 days. Wound vac output > 50mL today. Will need a portable Prevena pump for discharge after drainage has stabilized. Iron  replenished yesterday, Hgb slightly improved to 7.4. Will give additional dose of IV iron . - Start  Augmentin  BID for 7 days. (Day 2/7) -- IV iron  200mg  - Oxycodone  20 mg every 4 hours as needed -- Restart Suboxone , half-dose back into patient's pain management tonight  - Dilaudid  0.5 mg IV every 2 hours as needed for breakthrough pain - Schedule Tylenol  1000 mg every 8 hours and 3 days of Robaxin  1500 mg every 6 hours   Microcytic anemia In setting of acute infection and multiple surgeries hemoglobin dropped after admission but has remained stable between 7-8. Holding VTE prophylaxis per orthopedic surgery. Hgb today 7.4.  - Continue daily CBC   Opiate Use Disorder Adjusting chronic pain management medications before patient's discharge, which includes adding back suboxone  and down-titrating his oxycodone .   Sepsis induced cholestasis, resolved Hyponatremia, resolved  Iliac and inguinal adenopathy Seen on admission CT and noted to be concerning for malignancy but more likely associated with acute infection.  He will follow-up CT before discharge.   COPD Continue albuterol  PRN.  Best Practice: Diet: Regular diet IVF: None VTE: SCDs Start: 01/14/24 1646 SCDs Start: 01/09/24 0716 Code: Full  Disposition planning: Therapy Recs: None, DME: none Family Contact: dad DISPO: Anticipated discharge pending labs stabilization  Signature: Dailey Alberson, DO Internal Medicine Resident, PGY-1 Please contact the on call pager at [phone] for any urgent or emergent needs. 9:42 AM 01/23/2024

## 2024-01-24 DIAGNOSIS — Z7982 Long term (current) use of aspirin: Secondary | ICD-10-CM

## 2024-01-24 LAB — CBC
HCT: 23.5 % — ABNORMAL LOW (ref 39.0–52.0)
Hemoglobin: 7.5 g/dL — ABNORMAL LOW (ref 13.0–17.0)
MCH: 25.2 pg — ABNORMAL LOW (ref 26.0–34.0)
MCHC: 31.9 g/dL (ref 30.0–36.0)
MCV: 78.9 fL — ABNORMAL LOW (ref 80.0–100.0)
Platelets: 415 K/uL — ABNORMAL HIGH (ref 150–400)
RBC: 2.98 MIL/uL — ABNORMAL LOW (ref 4.22–5.81)
RDW: 18.4 % — ABNORMAL HIGH (ref 11.5–15.5)
WBC: 9.7 K/uL (ref 4.0–10.5)
nRBC: 0 % (ref 0.0–0.2)

## 2024-01-24 MED ORDER — BUPRENORPHINE HCL-NALOXONE HCL 8-2 MG SL SUBL
1.0000 | SUBLINGUAL_TABLET | Freq: Three times a day (TID) | SUBLINGUAL | Status: DC
  Administered 2024-01-24 – 2024-01-26 (×5): 1 via SUBLINGUAL
  Filled 2024-01-24 (×5): qty 1

## 2024-01-24 MED ORDER — OXYCODONE HCL 5 MG PO TABS
10.0000 mg | ORAL_TABLET | ORAL | Status: DC | PRN
  Administered 2024-01-24 (×4): 10 mg via ORAL
  Filled 2024-01-24 (×4): qty 2

## 2024-01-24 MED ORDER — ACETAMINOPHEN 500 MG PO TABS
1000.0000 mg | ORAL_TABLET | Freq: Four times a day (QID) | ORAL | Status: DC
  Administered 2024-01-24 – 2024-01-26 (×7): 1000 mg via ORAL
  Filled 2024-01-24 (×9): qty 2

## 2024-01-24 MED ORDER — HYDROMORPHONE HCL 1 MG/ML IJ SOLN
0.5000 mg | Freq: Once | INTRAMUSCULAR | Status: AC | PRN
  Administered 2024-01-24: 0.5 mg via INTRAVENOUS

## 2024-01-24 NOTE — Progress Notes (Signed)
 HD#17 SUBJECTIVE:  Patient Summary: Robert Eaton is a 38 y.o. with a pertinent PMH of COPD, polysubstance abuse, abuse disorder on Suboxone , alcohol use disorder without known withdrawal, and major depressive disorder, who presented with right lower extremity edema and jaundice and admitted for sepsis 2/2 right lower extermity cellulitis and fasciitis with presenting leukemoid reaction and cholestasis of sepsis that have resolved.  Overnight Events: Patient nervous about transition to suboxone  from acute pain control medications. Got Dilaudid  20 minutes early due to movement from bed to chair.  Interim History: Patient evaluated at bedside.  He is anxious today.  Discussed his concerns for pain management, infection prevention, and general wellbeing upon discharge, as he is concerned that he will not be able to meet any of these once he is re-incarcerated.  His pain is not controlled, and states that he got little to no sleep overnight due to the throbbing, stabbing pain along his incision.  He thinks the wound VAC is contributing to the pain.  Endorses normal bowel movements, urination, and appetite.  Wound vacuum continuing to have serosanguineous output, around 100 mL since yesterday around 6 PM.   OBJECTIVE:  Vital Signs: Vitals:   01/23/24 1657 01/23/24 2139 01/24/24 0401 01/24/24 0828  BP: 92/60 99/68 110/76 101/72  Pulse: 98 97 97 (!) 109  Resp:    19  Temp: 98.2 F (36.8 C) 98.1 F (36.7 C) 99.2 F (37.3 C) 99 F (37.2 C)  TempSrc: Oral Oral Oral Oral  SpO2: 98% 98% 98% 99%  Weight:      Height:       Supplemental O2: Room Air SpO2: 99 % O2 Flow Rate (L/min): 2 L/min  Filed Weights   01/21/24 1103 01/22/24 0511 01/23/24 0439  Weight: 81.9 kg 82 kg 83.1 kg     Intake/Output Summary (Last 24 hours) at 01/24/2024 1236 Last data filed at 01/24/2024 0200 Gross per 24 hour  Intake 710 ml  Output 3110 ml  Net -2400 ml   Net IO Since Admission: -33,306.98 mL [01/24/24  1236]  Physical Exam: Physical Exam Constitutional:      General: He is not in acute distress.    Comments: Anxious appearing  Cardiovascular:     Rate and Rhythm: Normal rate and regular rhythm.     Pulses: Normal pulses.  Pulmonary:     Effort: Pulmonary effort is normal.     Breath sounds: Normal breath sounds.  Abdominal:     General: Abdomen is flat. Bowel sounds are normal. There is no distension.     Palpations: Abdomen is soft.  Musculoskeletal:        General: Tenderness present.     Comments: Right lower extremity wrapped in loban from the foot to the knee.  No new ascending erythema or warmth about the knee.  Wound vacuum appropriately placed and draining.  Skin:    General: Skin is warm and dry.  Neurological:     General: No focal deficit present.     Mental Status: He is alert and oriented to person, place, and time.  Psychiatric:        Behavior: Behavior normal.     Comments: Anxious mood      Pertinent labs and imaging:      Latest Ref Rng & Units 01/24/2024    5:08 AM 01/23/2024    4:33 AM 01/22/2024    5:55 AM  CBC  WBC 4.0 - 10.5 K/uL 9.7  10.4  11.5   Hemoglobin  13.0 - 17.0 g/dL 7.5  7.4  7.1   Hematocrit 39.0 - 52.0 % 23.5  23.2  23.3   Platelets 150 - 400 K/uL 415  479  560        Latest Ref Rng & Units 01/21/2024    5:01 AM 01/16/2024    4:37 AM 01/15/2024    6:41 AM  CMP  Glucose 70 - 99 mg/dL 95  893  886   BUN 6 - 20 mg/dL 9  6  6    Creatinine 0.61 - 1.24 mg/dL 9.24  9.13  9.10   Sodium 135 - 145 mmol/L 135  138  131   Potassium 3.5 - 5.1 mmol/L 3.6  4.6  4.6   Chloride 98 - 111 mmol/L 106  103  101   CO2 22 - 32 mmol/L 23  27  24    Calcium 8.9 - 10.3 mg/dL 8.6  8.3  7.9   Total Protein 6.5 - 8.1 g/dL  6.7  7.0   Total Bilirubin 0.0 - 1.2 mg/dL  0.7  0.8   Alkaline Phos 38 - 126 U/L  92  104   AST 15 - 41 U/L  25  33   ALT 0 - 44 U/L  28  35     No results found.    ASSESSMENT/PLAN:  Assessment: Principal Problem:    Fasciitis Active Problems:   Polysubstance abuse (HCC)   Alcohol use disorder   Opioid use disorder   Cellulitis of right lower extremity   Sepsis (HCC)   Jaundice   Hyperbilirubinemia   Hyponatremia   Hepatic steatosis   Severe protein-calorie malnutrition (HCC)   Necrotizing fasciitis due to Streptococcus pyogenes (HCC)   Hypokalemia  Mouhamed Glassco is a 38 y.o. with a pertinent PMH of COPD, polysubstance abuse, abuse disorder on Suboxone , alcohol use disorder without known withdrawal, and major depressive disorder, who presented with right lower extremity edema and jaundice and admitted for sepsis 2/2 right lower extermity cellulitis with hyperbilirubinemia and severe leukocytosis.   Plan: RLE cellulitis/fasciitis Group A strep Final incision and drainage done 8/13. Orthopedics recommends continued antibiotics for 3 weeks based on initial I&D on 7/31.  Continuing PO Augmentin  for 7 days. Wound vac output > 50mL today. Will need a portable Prevena pump for discharge after drainage has stabilized.  Patient's home Suboxone  was added back this morning at a half dose of 2-0.5 mg.  Will work our way up back to patient's home dose of 8-2 mg 3 times daily.  Will titrate oxycodone  dose down from 20mg  to 10 mg, and have scheduled as breakthrough pain medication. - Augmentin  BID (Day 3/7) - Oxycodone  10 mg every 4 hours as needed for breakthrough pain -- Restart Suboxone , half-dose back into patient's pain management  - Tylenol  1000 mg every 6 hours  Microcytic anemia In setting of acute infection and multiple surgeries hemoglobin dropped after admission but has remained stable between 7-8. Holding VTE prophylaxis per orthopedic surgery. Gave 2 doses of IV iron  200mg . Hgb today 7.5.  - Continue daily CBC  Opiate Use Disorder Adjusting chronic pain management medications before patient's discharge, which includes adding back suboxone  and down-titrating his oxycodone .  Oxycodone  and Dilaudid  were  held this morning in order to begin to add back patient's home Suboxone .  He is concerned about pain management.  Will down-titrate oxycodone  to continue to help patient's acute pain. Tylenol  was increased from TID to QID as a means of nonopioid pain management.  Sepsis induced cholestasis, resolved Hyponatremia, resolved  Iliac and inguinal adenopathy Seen on admission CT and noted to be concerning for malignancy but more likely associated with acute infection.  He will follow-up CT before discharge.   COPD Continue albuterol  PRN.  Best Practice: Diet: Regular diet IVF: None VTE: SCDs Start: 01/14/24 1646 SCDs Start: 01/09/24 0716 Code: Full  Disposition planning: Therapy Recs: None, DME: none Family Contact: dad DISPO: Anticipated discharge 1-2 days pending pain management and lab stabilization  Signature: Kali Ambler, DO Internal Medicine Resident, PGY-1 Please contact the on call pager at [phone] for any urgent or emergent needs. 12:36 PM 01/24/2024

## 2024-01-24 NOTE — Progress Notes (Addendum)
 Pt is nervous about the change over from iv dilaudid  to suboxone , and is concerned a script for pain meds while in jail won't get transferred. Education provided. Pt's pain meds fall off at 05:00 this am. While changing pt's sheets he was visibly in a lot of pain while standing/transferring to chair.  Gave dilaudid  20 mins early because of pain getting to chair. Pt asking if MD will still provide something while transitioning to suboxone . Advised in am during rounding MD will address. Pt still has wound vac from recent I & D of right lower extremity.

## 2024-01-24 NOTE — Plan of Care (Signed)
  Problem: Clinical Measurements: Goal: Ability to avoid or minimize complications of infection will improve Outcome: Progressing   Problem: Skin Integrity: Goal: Skin integrity will improve Outcome: Progressing   Problem: Education: Goal: Knowledge of General Education information will improve Description: Including pain rating scale, medication(s)/side effects and non-pharmacologic comfort measures Outcome: Progressing   Problem: Health Behavior/Discharge Planning: Goal: Ability to manage health-related needs will improve Outcome: Progressing   Problem: Clinical Measurements: Goal: Ability to maintain clinical measurements within normal limits will improve Outcome: Progressing Goal: Will remain free from infection Outcome: Progressing Goal: Diagnostic test results will improve Outcome: Progressing Goal: Respiratory complications will improve Outcome: Progressing Goal: Cardiovascular complication will be avoided Outcome: Progressing   Problem: Activity: Goal: Risk for activity intolerance will decrease Outcome: Progressing   Problem: Nutrition: Goal: Adequate nutrition will be maintained Outcome: Progressing   Problem: Coping: Goal: Level of anxiety will decrease Outcome: Progressing   Problem: Elimination: Goal: Will not experience complications related to bowel motility Outcome: Progressing Goal: Will not experience complications related to urinary retention Outcome: Progressing   Problem: Pain Managment: Goal: General experience of comfort will improve and/or be controlled Outcome: Progressing   Problem: Safety: Goal: Ability to remain free from injury will improve Outcome: Progressing   Problem: Skin Integrity: Goal: Risk for impaired skin integrity will decrease Outcome: Progressing

## 2024-01-25 LAB — CBC
HCT: 25.7 % — ABNORMAL LOW (ref 39.0–52.0)
Hemoglobin: 8.2 g/dL — ABNORMAL LOW (ref 13.0–17.0)
MCH: 25.4 pg — ABNORMAL LOW (ref 26.0–34.0)
MCHC: 31.9 g/dL (ref 30.0–36.0)
MCV: 79.6 fL — ABNORMAL LOW (ref 80.0–100.0)
Platelets: 431 K/uL — ABNORMAL HIGH (ref 150–400)
RBC: 3.23 MIL/uL — ABNORMAL LOW (ref 4.22–5.81)
RDW: 18.3 % — ABNORMAL HIGH (ref 11.5–15.5)
WBC: 8 K/uL (ref 4.0–10.5)
nRBC: 0 % (ref 0.0–0.2)

## 2024-01-25 MED ORDER — METHOCARBAMOL 500 MG PO TABS
500.0000 mg | ORAL_TABLET | Freq: Three times a day (TID) | ORAL | Status: DC
  Administered 2024-01-25 – 2024-01-26 (×4): 500 mg via ORAL
  Filled 2024-01-25 (×4): qty 1

## 2024-01-25 MED ORDER — OXYCODONE HCL 5 MG PO TABS
10.0000 mg | ORAL_TABLET | ORAL | Status: DC | PRN
  Administered 2024-01-25 – 2024-01-26 (×7): 10 mg via ORAL
  Filled 2024-01-25 (×7): qty 2

## 2024-01-25 MED ORDER — OXYCODONE HCL 5 MG PO TABS
10.0000 mg | ORAL_TABLET | Freq: Once | ORAL | Status: AC
  Administered 2024-01-25: 10 mg via ORAL
  Filled 2024-01-25: qty 2

## 2024-01-25 MED ORDER — OXYCODONE HCL 5 MG PO TABS
15.0000 mg | ORAL_TABLET | ORAL | Status: DC | PRN
  Administered 2024-01-25 (×2): 15 mg via ORAL
  Filled 2024-01-25 (×2): qty 3

## 2024-01-25 NOTE — Progress Notes (Addendum)
 HD#[id number] SUBJECTIVE:  Patient Summary: Robert Eaton is a 38 y.o. with a pertinent PMH of COPD, polysubstance abuse, abuse disorder on Suboxone , alcohol use disorder without known withdrawal, and major depressive disorder, who presented with right lower extremity edema and jaundice and admitted for sepsis 2/2 right lower extermity cellulitis and fasciitis with presenting leukemoid reaction and cholestasis of sepsis that have resolved.   Overnight Events and Interim History: NEO. Still complains of pain in the RLE. Relieved with his pain medicine to moderate effect. Does not feel pain is improving.  OBJECTIVE:  Vital Signs: Vitals:   01/25/24 0023 01/25/24 0410 01/25/24 0851 01/25/24 1151  BP:  102/74 105/72 106/79  Pulse:  99 88 87  Resp: 18  18 18   Temp:  98.2 F (36.8 C) 97.7 F (36.5 C) (!) 97.5 F (36.4 C)  TempSrc:  Oral Oral Oral  SpO2:  96% 97% 97%  Weight:      Height:       Supplemental O2: Room Air SpO2: 97 % O2 Flow Rate (L/min): 0 L/min  Filed Weights   01/21/24 1103 01/22/24 0511 01/23/24 0439  Weight: 81.9 kg 82 kg 83.1 kg     Intake/Output Summary (Last 24 hours) at 01/25/2024 1227 Last data filed at 01/25/2024 1153 Gross per 24 hour  Intake 1320 ml  Output 4050 ml  Net -2730 ml   Net IO Since Admission: -35,796.98 mL [01/25/24 1227]  Physical Exam: Physical Exam Constitutional:      General: He is not in acute distress. Cardiovascular:     Rate and Rhythm: Normal rate and regular rhythm.     Pulses: Normal pulses.  Pulmonary:     Effort: Pulmonary effort is normal.     Breath sounds: Normal breath sounds.  Abdominal:     General: Abdomen is flat. Bowel sounds are normal. There is no distension.     Palpations: Abdomen is soft.  Musculoskeletal:        General: Tenderness present.     Comments: Right lower extremity wrapped in loban from the foot to the knee.  No new ascending erythema or warmth about the knee.  Wound vacuum appropriately placed  and draining. Skin:    General: Skin is warm and dry.  Neurological:     General: No focal deficit present.     Mental Status: He is alert and oriented to person, place, and time.  Psychiatric:        Behavior: Behavior normal.     Comments: Anxious mood   Patient Lines/Drains/Airways Status     Active Line/Drains/Airways     Name Placement date Placement time Site Days   Peripheral IV 01/14/24 18 G 2.5 Anterior;Left;Proximal;Upper Arm 01/14/24  0652  Arm  11   Negative Pressure Wound Therapy Leg Right;Posterior;Lower 01/21/24  1336  --  4   Wound 01/09/24 0020 Surgical Closed Surgical Incision Leg Right 01/09/24  0020  Leg  16   Wound 01/16/24 2000 Infection Leg Right;Mid 01/16/24  2000  Leg  9             ASSESSMENT/PLAN:  Assessment: Principal Problem:   Fasciitis Active Problems:   Polysubstance abuse (HCC)   Alcohol use disorder   Opioid use disorder   Cellulitis of right lower extremity   Sepsis (HCC)   Jaundice   Hyperbilirubinemia   Hyponatremia   Hepatic steatosis   Severe protein-calorie malnutrition (HCC)   Necrotizing fasciitis due to Streptococcus pyogenes (HCC)   Hypokalemia  Robert Eaton is a 38 y.o. with a pertinent PMH of COPD, polysubstance abuse, abuse disorder on Suboxone , alcohol use disorder without known withdrawal, and major depressive disorder, who presented with right lower extremity edema and jaundice and admitted for sepsis 2/2 right lower extermity cellulitis and fasciitis with presenting leukemoid reaction and cholestasis of sepsis that have resolved.   Plan: RLE cellulitis/fasciitis Group A strep Final incision and drainage done 8/13. Orthopedics recommends continued antibiotics for 3 weeks based on initial I&D on 7/31.  Continuing PO Augmentin  for 7 days. Wound vac output improving day-to-day. Will need a portable Prevena pump for discharge after drainage has stabilized (this is present at nurse station).  Patient's home Suboxone   back to full dose of 8-2 mg 3 times daily.  Oxycodone  dose down from 20mg  to 10 mg, and have scheduled as breakthrough pain medication. - Augmentin  BID (Day 4/7) - Oxycodone  10 mg every 4 hours as needed for breakthrough pain -- Suboxone  8-2 mg 3 times daily - Tylenol  1000 mg every 6 hours -- add robaxin  500 TID -- Wound drain not changed since Friday evening. Total output is 200 @ 10am today which represents improved output from yesterday. -- May be able to discharge tomorrow on some oxycodone  with continued low drain output. Nurse station has prevena pump. -- Has FU with ortho this coming Friday   Microcytic anemia Improving. In setting of acute infection and multiple surgeries hemoglobin dropped after admission but has remained stable between 7-8. Holding VTE prophylaxis per orthopedic surgery. Gave 2 doses of IV iron  200mg . - Continue daily CBC   Opiate Use Disorder Adjusting chronic pain management medications before patient's discharge, which includes adding back suboxone  and down-titrating his oxycodone . Now full dose suboxone  and oxycodone  as breakthrough, which he continues to utilize.   Sepsis induced cholestasis, resolved Hyponatremia, resolved   Iliac and inguinal adenopathy Seen on admission CT and noted to be concerning for malignancy but more likely associated with acute infection.  He will follow-up CT before discharge.    COPD Continue albuterol  PRN.  Best Practice: Diet: Regular diet IVF: None VTE: SCDs Start: 01/14/24 1646 SCDs Start: 01/09/24 9283 Code: Full  Signature: Lonni Africa, D.O.  Internal Medicine Resident, PGY-2 [facility]  Pager: # [phone]. 12:27 PM, 01/25/2024

## 2024-01-25 NOTE — Plan of Care (Signed)
  Problem: Clinical Measurements: Goal: Ability to avoid or minimize complications of infection will improve Outcome: Progressing   Problem: Skin Integrity: Goal: Skin integrity will improve Outcome: Progressing   Problem: Education: Goal: Knowledge of General Education information will improve Description: Including pain rating scale, medication(s)/side effects and non-pharmacologic comfort measures Outcome: Progressing   Problem: Health Behavior/Discharge Planning: Goal: Ability to manage health-related needs will improve Outcome: Progressing   Problem: Clinical Measurements: Goal: Ability to maintain clinical measurements within normal limits will improve Outcome: Progressing Goal: Will remain free from infection Outcome: Progressing Goal: Diagnostic test results will improve Outcome: Progressing Goal: Respiratory complications will improve Outcome: Progressing Goal: Cardiovascular complication will be avoided Outcome: Progressing   Problem: Activity: Goal: Risk for activity intolerance will decrease Outcome: Progressing   Problem: Nutrition: Goal: Adequate nutrition will be maintained Outcome: Progressing   Problem: Coping: Goal: Level of anxiety will decrease Outcome: Progressing   Problem: Elimination: Goal: Will not experience complications related to bowel motility Outcome: Progressing Goal: Will not experience complications related to urinary retention Outcome: Progressing   Problem: Pain Managment: Goal: General experience of comfort will improve and/or be controlled Outcome: Progressing   Problem: Safety: Goal: Ability to remain free from injury will improve Outcome: Progressing   Problem: Skin Integrity: Goal: Risk for impaired skin integrity will decrease Outcome: Progressing

## 2024-01-25 NOTE — Progress Notes (Signed)
 Pt ambulated with walker and this RN to the bathroom, cleaned up and sitting in chair since 04:00. Pt very worried about going back to jail with wound vac and pain. Asked for dilaudid . MD gave a one time dose of oxy 10 and upped his oxy to 15mg . Pt relieved and did ok without dilaudid . Wound vac draining small amount of serosang. Foot and leg slightly swollen; pt states chair is more comfortable-Feet raised in recliner.

## 2024-01-26 DIAGNOSIS — K760 Fatty (change of) liver, not elsewhere classified: Secondary | ICD-10-CM

## 2024-01-26 LAB — CBC
HCT: 25.2 % — ABNORMAL LOW (ref 39.0–52.0)
Hemoglobin: 7.9 g/dL — ABNORMAL LOW (ref 13.0–17.0)
MCH: 25.2 pg — ABNORMAL LOW (ref 26.0–34.0)
MCHC: 31.3 g/dL (ref 30.0–36.0)
MCV: 80.5 fL (ref 80.0–100.0)
Platelets: 386 K/uL (ref 150–400)
RBC: 3.13 MIL/uL — ABNORMAL LOW (ref 4.22–5.81)
RDW: 18.2 % — ABNORMAL HIGH (ref 11.5–15.5)
WBC: 6.6 K/uL (ref 4.0–10.5)
nRBC: 0 % (ref 0.0–0.2)

## 2024-01-26 MED ORDER — AMOXICILLIN-POT CLAVULANATE 875-125 MG PO TABS: 1.0000 | ORAL_TABLET | Freq: Two times a day (BID) | ORAL | 0 refills | Status: AC

## 2024-01-26 MED ORDER — OXYCODONE HCL 10 MG PO TABS: 10.0000 mg | ORAL_TABLET | ORAL | 0 refills | Status: AC | PRN

## 2024-01-26 MED ORDER — ACETAMINOPHEN 500 MG PO TABS: 1000.0000 mg | ORAL_TABLET | Freq: Four times a day (QID) | ORAL | 0 refills | Status: AC

## 2024-01-26 MED ORDER — PANTOPRAZOLE SODIUM 40 MG PO TBEC: 40.0000 mg | DELAYED_RELEASE_TABLET | Freq: Every day | ORAL | 0 refills | Status: AC

## 2024-01-26 MED ORDER — METHOCARBAMOL 500 MG PO TABS: 500.0000 mg | ORAL_TABLET | Freq: Three times a day (TID) | ORAL | 0 refills | Status: AC | PRN

## 2024-01-26 MED ORDER — BUPRENORPHINE HCL 8 MG SL SUBL: 24.0000 mg | SUBLINGUAL_TABLET | Freq: Every day | SUBLINGUAL | 0 refills | Status: AC

## 2024-01-26 NOTE — Discharge Summary (Signed)
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 dry. You will need to elevate your leg laying down when possible, either with extra pillows or blankets to help heal your wound.  We are so glad that you are feeling better.  Sincerely, Lanika Colgate, DO      Signed: June Vacha, DO Internal Medicine Resident, PGY-1 01/26/2024, 1:11 PM   Please contact the on call pager after 5 pm and on weekends at [phone]

## 2024-01-26 NOTE — TOC Transition Note (Signed)
 Transition of Care [facility]) - Discharge Note   Patient Details  Name: Robert Eaton MRN: [id number] Date of Birth: 03-31-1986  [facility]) CM/SW Contact:  Robynn Eileen Hoose, RN Phone Number: 01/26/2024, 2:03 PM   Clinical Narrative:    [facility] Prevena [facility] vac.   Final [facility] care[facility] Barriers to Discharge: No Barriers Identified   Patient Goals and CMS Choice            Discharge Placement                       Discharge Plan and Services Additional resources added to the After Visit Summary for                                       Social Drivers of Health (SDOH) Interventions SDOH Screenings   Food Insecurity: Patient Declined (01/15/2024)  Housing: High Risk (01/15/2024)  Transportation Needs: No Transportation Needs (01/15/2024)  Utilities: Not At Risk (01/08/2024)  Alcohol Screen: Low Risk  (09/18/2023)  Depression (PHQ2-9): Medium Risk (09/18/2023)  Tobacco Use: High Risk (01/21/2024)     Readmission Risk Interventions     No data to display

## 2024-01-26 NOTE — Plan of Care (Signed)
  Problem: Clinical Measurements: Goal: Ability to avoid or minimize complications of infection will improve Outcome: Progressing   Problem: Skin Integrity: Goal: Skin integrity will improve Outcome: Progressing   Problem: Education: Goal: Knowledge of General Education information will improve Description: Including pain rating scale, medication(s)/side effects and non-pharmacologic comfort measures Outcome: Progressing   Problem: Health Behavior/Discharge Planning: Goal: Ability to manage health-related needs will improve Outcome: Progressing   Problem: Clinical Measurements: Goal: Ability to maintain clinical measurements within normal limits will improve Outcome: Progressing Goal: Will remain free from infection Outcome: Progressing Goal: Diagnostic test results will improve Outcome: Progressing Goal: Respiratory complications will improve Outcome: Progressing Goal: Cardiovascular complication will be avoided Outcome: Progressing   Problem: Activity: Goal: Risk for activity intolerance will decrease Outcome: Progressing   Problem: Nutrition: Goal: Adequate nutrition will be maintained Outcome: Progressing   Problem: Coping: Goal: Level of anxiety will decrease Outcome: Progressing   Problem: Elimination: Goal: Will not experience complications related to bowel motility Outcome: Progressing Goal: Will not experience complications related to urinary retention Outcome: Progressing   Problem: Pain Managment: Goal: General experience of comfort will improve and/or be controlled Outcome: Progressing   Problem: Safety: Goal: Ability to remain free from injury will improve Outcome: Progressing   Problem: Skin Integrity: Goal: Risk for impaired skin integrity will decrease Outcome: Progressing

## 2024-01-26 NOTE — Progress Notes (Signed)
 [facility]     Vac with good suction Total 250  cc drainage in canister serosanguinous cultures showing group A strep. Currently on Unasyn  IV coverage.      A/P: Necrotizing fasciitis right leg s/p  PROCEDURE: Excisional debridement right calf with excision of skin soft tissue muscle and fascia. Local tissue transfer for wound closure 45 x 15 cm. Application of Kerecis micro graft 95 cm to cover wound surface area of 500 cm. Application of cleanse choice wound VAC sponge x 1 and application of peel in place wound VAC sponges x 2.   Vac will be maintained for 1 week following discharge.  When D/C'd will change to prevena vac. Have reinforced vac dressing with Ioban this am.   If suction lost will change to Vashe wet to dry dressing changes daily.   Hydrocodone  prescribed.   Antibiotic coverage for 3 weeks from 01/08/24. F/U in office next friday.  Maurilio Deland Collet PA-C

## 2024-02-04 ENCOUNTER — Encounter: Payer: Self-pay | Admitting: Family

## 2024-02-04 ENCOUNTER — Ambulatory Visit (INDEPENDENT_AMBULATORY_CARE_PROVIDER_SITE_OTHER): Admitting: Family

## 2024-02-04 DIAGNOSIS — B950 Streptococcus, group A, as the cause of diseases classified elsewhere: Secondary | ICD-10-CM

## 2024-02-04 DIAGNOSIS — M726 Necrotizing fasciitis: Secondary | ICD-10-CM

## 2024-02-04 NOTE — Progress Notes (Signed)
   Post-Op Visit Note   Patient: Robert Eaton           Date of Birth: Mar 11, 1986           MRN: [id number] Visit Date: 02/04/2024 PCP: Pcp, No  Chief Complaint:  Chief Complaint  Patient presents with   Right Leg - Routine Post Op    HPI:  HPI The patient is a 38 year old gentleman who presents status post irrigation debridement for necrotizing fasciitis to the right calf.  Wound VAC removed today. Ortho Exam Please see attached image  On examination right calf the wound is filled in with 100% beefy granulation tissue there is no surrounding erythema or maceration no sign of infection  Visit Diagnoses: No diagnosis found.  Plan: Please begin daily Dial soap cleansing.  Vashe to dry dressing changes Ace for compression elevate for swelling  Follow-Up Instructions: No follow-ups on file.   Imaging: No results found.  Orders:  No orders of the defined types were placed in this encounter.  No orders of the defined types were placed in this encounter.    PMFS History: Patient Active Problem List   Diagnosis Date Noted   Hypokalemia 01/14/2024   Necrotizing fasciitis due to Streptococcus pyogenes (HCC) 01/12/2024   Severe protein-calorie malnutrition (HCC) 01/10/2024   Hepatic steatosis 01/09/2024   Sepsis (HCC) 01/08/2024   Jaundice 01/08/2024   Hyperbilirubinemia 01/08/2024   Hyponatremia 01/08/2024   Fasciitis 01/08/2024   Cellulitis of right lower extremity 01/07/2024   Alcohol use disorder 09/19/2023   Cocaine abuse (HCC) 09/19/2023   Opioid use disorder 09/19/2023   GERD (gastroesophageal reflux disease) 09/19/2023   Tobacco use disorder 09/19/2023   COPD (chronic obstructive pulmonary disease) (HCC)    Polysubstance abuse (HCC) 09/18/2023   Past Medical History:  Diagnosis Date   COPD (chronic obstructive pulmonary disease) (HCC)    Irregular heartbeat     No family history on file.  Past Surgical History:  Procedure Laterality Date   APPLICATION  OF WOUND VAC Right 01/08/2024   Procedure: APPLICATION, WOUND VAC;  Surgeon: Fidel Rogue, MD;  Location: [facility] OR;  Service: Orthopedics;  Laterality: Right;   APPLICATION OF WOUND VAC Right 01/14/2024   Procedure: APPLICATION, WOUND VAC;  Surgeon: Harden Jerona GAILS, MD;  Location: [facility] OR;  Service: Orthopedics;  Laterality: Right;   INCISION AND DRAINAGE OF DEEP ABSCESS, CALF Right 01/21/2024   Procedure: INCISION AND DRAINAGE OF DEEP ABSCESS, CALF;  Surgeon: Harden Jerona GAILS, MD;  Location: [facility] OR;  Service: Orthopedics;  Laterality: Right;   INCISION AND DRAINAGE OF WOUND Right 01/08/2024   Procedure: IRRIGATION AND DEBRIDEMENT WOUND;  Surgeon: Fidel Rogue, MD;  Location: [facility] OR;  Service: Orthopedics;  Laterality: Right;  LOWER LEG AND FOOT WOUND VAC   INCISION AND DRAINAGE OF WOUND Right 01/14/2024   Procedure: IRRIGATION AND DEBRIDEMENT WOUND;  Surgeon: Harden Jerona GAILS, MD;  Location: [facility] OR;  Service: Orthopedics;  Laterality: Right;  RIGHT LEG DEBRIDEMENT   LEG SURGERY Right    Social History   Occupational History   Not on file  Tobacco Use   Smoking status: Every Day    Current packs/day: 1.50    Types: Cigarettes    Passive exposure: Current   Smokeless tobacco: Never  Substance and Sexual Activity   Alcohol use: Yes    Comment: several beers per day   Drug use: Yes    Types: Marijuana   Sexual activity: Not Currently

## 2024-04-12 ENCOUNTER — Encounter: Payer: Self-pay | Admitting: Radiology
